# Patient Record
Sex: Female | Born: 1955 | Hispanic: Yes | Marital: Single | Smoking: Current some day smoker
Health system: Southern US, Community
[De-identification: ages and names within clinical notes are randomized; demographics above are authoritative.]

## PROBLEM LIST (undated history)

## (undated) DIAGNOSIS — I10 Essential (primary) hypertension: Secondary | ICD-10-CM

## (undated) DIAGNOSIS — I471 Supraventricular tachycardia, unspecified: Secondary | ICD-10-CM

## (undated) DIAGNOSIS — D1771 Benign lipomatous neoplasm of kidney: Secondary | ICD-10-CM

## (undated) DIAGNOSIS — M549 Dorsalgia, unspecified: Secondary | ICD-10-CM

## (undated) DIAGNOSIS — D649 Anemia, unspecified: Secondary | ICD-10-CM

## (undated) DIAGNOSIS — F329 Major depressive disorder, single episode, unspecified: Secondary | ICD-10-CM

## (undated) DIAGNOSIS — K759 Inflammatory liver disease, unspecified: Secondary | ICD-10-CM

## (undated) DIAGNOSIS — R000 Tachycardia, unspecified: Secondary | ICD-10-CM

## (undated) DIAGNOSIS — D3001 Benign neoplasm of right kidney: Secondary | ICD-10-CM

## (undated) DIAGNOSIS — E119 Type 2 diabetes mellitus without complications: Secondary | ICD-10-CM

## (undated) DIAGNOSIS — K219 Gastro-esophageal reflux disease without esophagitis: Secondary | ICD-10-CM

## (undated) DIAGNOSIS — N200 Calculus of kidney: Secondary | ICD-10-CM

## (undated) DIAGNOSIS — G43909 Migraine, unspecified, not intractable, without status migrainosus: Secondary | ICD-10-CM

## (undated) DIAGNOSIS — G8929 Other chronic pain: Secondary | ICD-10-CM

## (undated) DIAGNOSIS — M1990 Unspecified osteoarthritis, unspecified site: Secondary | ICD-10-CM

## (undated) DIAGNOSIS — F32A Depression, unspecified: Secondary | ICD-10-CM

## (undated) DIAGNOSIS — IMO0002 Reserved for concepts with insufficient information to code with codable children: Secondary | ICD-10-CM

## (undated) HISTORY — PX: APPENDECTOMY: SHX54

## (undated) HISTORY — DX: Depression, unspecified: F32.A

## (undated) HISTORY — DX: Benign neoplasm of right kidney: D30.01

## (undated) HISTORY — DX: Unspecified osteoarthritis, unspecified site: M19.90

## (undated) HISTORY — DX: Reserved for concepts with insufficient information to code with codable children: IMO0002

## (undated) HISTORY — DX: Major depressive disorder, single episode, unspecified: F32.9

## (undated) HISTORY — PX: REDUCTION MAMMAPLASTY: SUR839

## (undated) HISTORY — DX: Tachycardia, unspecified: R00.0

## (undated) HISTORY — DX: Benign lipomatous neoplasm of kidney: D17.71

## (undated) HISTORY — PX: KIDNEY STONE SURGERY: SHX686

---

## 2006-02-28 HISTORY — PX: GASTRIC BYPASS: SHX52

## 2012-11-26 ENCOUNTER — Emergency Department (HOSPITAL_COMMUNITY)
Admission: EM | Admit: 2012-11-26 | Discharge: 2012-11-26 | Disposition: A | Discharge status: Home / Self Care | Payer: Medicaid - Out of State | Attending: Emergency Medicine | Admitting: Emergency Medicine

## 2012-11-26 ENCOUNTER — Emergency Department (HOSPITAL_COMMUNITY): Payer: Medicaid - Out of State

## 2012-11-26 ENCOUNTER — Encounter (HOSPITAL_COMMUNITY): Payer: Self-pay

## 2012-11-26 DIAGNOSIS — I10 Essential (primary) hypertension: Secondary | ICD-10-CM | POA: Insufficient documentation

## 2012-11-26 DIAGNOSIS — F17200 Nicotine dependence, unspecified, uncomplicated: Secondary | ICD-10-CM | POA: Insufficient documentation

## 2012-11-26 DIAGNOSIS — Y9289 Other specified places as the place of occurrence of the external cause: Secondary | ICD-10-CM | POA: Insufficient documentation

## 2012-11-26 DIAGNOSIS — Y9389 Activity, other specified: Secondary | ICD-10-CM | POA: Insufficient documentation

## 2012-11-26 DIAGNOSIS — Z9889 Other specified postprocedural states: Secondary | ICD-10-CM | POA: Insufficient documentation

## 2012-11-26 DIAGNOSIS — Z9884 Bariatric surgery status: Secondary | ICD-10-CM | POA: Insufficient documentation

## 2012-11-26 DIAGNOSIS — W1809XA Striking against other object with subsequent fall, initial encounter: Secondary | ICD-10-CM | POA: Insufficient documentation

## 2012-11-26 DIAGNOSIS — S298XXA Other specified injuries of thorax, initial encounter: Secondary | ICD-10-CM | POA: Insufficient documentation

## 2012-11-26 DIAGNOSIS — Z79899 Other long term (current) drug therapy: Secondary | ICD-10-CM | POA: Insufficient documentation

## 2012-11-26 DIAGNOSIS — R0789 Other chest pain: Secondary | ICD-10-CM

## 2012-11-26 DIAGNOSIS — Z87442 Personal history of urinary calculi: Secondary | ICD-10-CM | POA: Insufficient documentation

## 2012-11-26 HISTORY — DX: Calculus of kidney: N20.0

## 2012-11-26 HISTORY — DX: Essential (primary) hypertension: I10

## 2012-11-26 LAB — BASIC METABOLIC PANEL
BUN: 15 mg/dL (ref 6–23)
Chloride: 106 mEq/L (ref 96–112)
GFR calc Af Amer: >90 mL/min (ref >90)
GFR calc non Af Amer: >90 mL/min (ref >90)
Potassium: 4.1 mEq/L (ref 3.5–5.1)
Sodium: 138 mEq/L (ref 135–145)

## 2012-11-26 LAB — CBC
HCT: 44.1 % (ref 36.0–46.0)
MCHC: 33.8 g/dL (ref 30.0–36.0)
Platelets: 273 10*3/uL (ref 150–400)
RDW: 12.4 % (ref 11.5–15.5)
WBC: 8.6 10*3/uL (ref 4.0–10.5)

## 2012-11-26 LAB — POCT I-STAT TROPONIN I
Troponin i, poc: 0 ng/mL (ref 0.00–0.08)
Troponin i, poc: 0.01 ng/mL (ref 0.00–0.08)

## 2012-11-26 LAB — D-DIMER, QUANTITATIVE: D-Dimer, Quant: <0.27 ug/mL-FEU (ref 0.00–0.48)

## 2012-11-26 MED ORDER — TRAMADOL-ACETAMINOPHEN 37.5-325 MG PO TABS: 1.0000 | ORAL_TABLET | Freq: Four times a day (QID) | ORAL | Status: DC | PRN

## 2012-11-26 MED ORDER — NAPROXEN 500 MG PO TABS: 500.0000 mg | ORAL_TABLET | Freq: Two times a day (BID) | ORAL | Status: DC

## 2012-11-26 MED ORDER — KETOROLAC TROMETHAMINE 30 MG/ML IJ SOLN
30.0000 mg | Freq: Once | INTRAMUSCULAR | Status: AC
  Administered 2012-11-26: 30 mg via INTRAVENOUS
  Filled 2012-11-26: qty 1

## 2012-11-26 MED ORDER — ASPIRIN 81 MG PO CHEW
324.0000 mg | CHEWABLE_TABLET | Freq: Once | ORAL | Status: AC
  Administered 2012-11-26: 324 mg via ORAL
  Filled 2012-11-26: qty 4

## 2012-11-26 NOTE — ED Provider Notes (Signed)
ECG interpretation   Date: 11/26/2012  Rate: 67  Rhythm: normal sinus rhythm  QRS Axis: normal  Intervals: normal  ST/T Wave abnormalities: normal  Conduction Disutrbances: none  Narrative Interpretation:   Old EKG Reviewed: no prior ecg   Lyanne Co, MD 11/26/12 1511

## 2012-11-26 NOTE — ED Notes (Addendum)
Pt c/o L side chest pain since last night and back pain since a fall x 2 weeks ago.  Pain score 10/10.  Pt sts pain increases with deep breathing.  Pt does not speak [place].  Hx of chronic back pain and vertigo.

## 2012-11-26 NOTE — ED Notes (Signed)
Patient refused IV. Patiaent stated, "I do not have any veins and I do not want anyone poking on me for 30 minutes." EDPA notified. Ordered to give toradol IM

## 2012-11-26 NOTE — ED Provider Notes (Signed)
CSN: [id number]     Arrival date & time 11/26/12  1352 History   First MD Initiated Contact with Patient 11/26/12 1544     Chief Complaint  Patient presents with  . Chest Pain  . Fall  . Back Pain   (Consider location/radiation/quality/duration/timing/severity/associated sxs/prior Treatment) HPI Comments: Patient presents with a chief complaint of chest pain.  She reports that the pain has been present for the past 2 weeks ever since she had a fall and hit the left side of her chest on the bathtub.  Pain located left anterior chest and does not radiate.  She reports that the pain is constant.  Pain worse with palpation and also with taking a deep breath.  She has not taken anything for pain prior to arrival.  She denies prolonged travel or surgery in the past 4 weeks.  Denies use of estrogen containing medication.  Denies lower extremity edema or pain.  Denies prior history of DVT or PE.  She currently smokes 4 cigarettes daily.  Patient does have a history of HTN.  Denies history of DM or Hyperlipidemia.  Denies prior cardiac history.  She states that she had a stress test done approximately 3 years ago in [place], which she reports was normal.  She denies SOB, nausea, vomiting, numbness, tingling, hemoptysis, fever, chills, or syncope.    Patient is a 57 y.o. female presenting with chest pain. The history is provided by the patient. The history is limited by a language barrier. A language interpreter was used (phone interpretor used).  Chest Pain   Past Medical History  Diagnosis Date  . Hypertension   . Kidney stones    Past Surgical History  Procedure Laterality Date  . Gastric bypass    . Appendectomy    . Breast surgery     History reviewed. No pertinent family history. History  Substance Use Topics  . Smoking status: Current Every Day Smoker -- 0.25 packs/day    Types: Cigarettes  . Smokeless tobacco: Never Used  . Alcohol Use: Yes     Comment: occ   OB History   Grav  Para Term Preterm Abortions TAB SAB Ect Mult Living                 Review of Systems  Cardiovascular: Positive for chest pain.  All other systems reviewed and are negative.    Allergies  Review of patient's allergies indicates no known allergies.  Home Medications   Current Outpatient Rx  Name  Route  Sig  Dispense  Refill  . dextran 70-hypromellose (TEARS RENEWED) ophthalmic solution   Both Eyes   Place 1 drop into both eyes 3 (three) times daily as needed (dry eyes).         . enalapril (VASOTEC) 10 MG tablet   Oral   Take 10 mg by mouth daily.         . meclizine (ANTIVERT) 25 MG tablet   Oral   Take 25 mg by mouth 3 (three) times daily as needed.         . meloxicam (MOBIC) 15 MG tablet   Oral   Take 15 mg by mouth daily.         . metoprolol (LOPRESSOR) 50 MG tablet   Oral   Take 50 mg by mouth 2 (two) times daily.         . traMADol-acetaminophen (ULTRACET) 37.5-325 MG per tablet   Oral   Take 1 tablet by mouth  every 6 (six) hours as needed for pain.         Marland Kitchen triamcinolone (NASACORT) 55 MCG/ACT nasal inhaler   Nasal   Place 2 sprays into the nose 2 (two) times daily.          BP 117/73  Pulse 69  Temp(Src) 98.2 F (36.8 C) (Oral)  Resp 16  SpO2 98% Physical Exam  Nursing note and vitals reviewed. Constitutional: She appears well-developed and well-nourished. No distress.  HENT:  Head: Normocephalic and atraumatic.  Mouth/Throat: Oropharynx is clear and moist.  Neck: Normal range of motion. Neck supple.  Cardiovascular: Normal rate, regular rhythm, normal heart sounds and intact distal pulses.   Pulmonary/Chest: Effort normal and breath sounds normal. No respiratory distress. She has no wheezes. She has no rales. She exhibits tenderness.  Left anterior chest tender to palpation.  Abdominal: Soft. There is no tenderness.  Musculoskeletal: Normal range of motion.  No lower extremity edema bilaterally  Neurological: She is alert. No  cranial nerve deficit or sensory deficit.  Skin: Skin is warm and dry. No rash noted. She is not diaphoretic. No erythema.  Psychiatric: She has a normal mood and affect.    ED Course  Procedures (including critical care time) Labs Review Labs Reviewed  BASIC METABOLIC PANEL - Abnormal; Notable for the following:    Glucose, Bld 116 (*)    All other components within normal limits  CBC  POCT I-STAT TROPONIN I   Imaging Review Dg Chest 2 View  11/26/2012   CLINICAL DATA:  Chest pain and shortness of breath.  EXAM: CHEST  2 VIEW  COMPARISON:  None.  FINDINGS: The heart size and mediastinal contours are within normal limits. Both lungs are clear. The visualized skeletal structures are unremarkable.  IMPRESSION: No active cardiopulmonary disease.   Electronically Signed   By: Richarda Overlie M.D.   On: 11/26/2012 16:24    Date: 11/28/2012  Rate: 67  Rhythm: normal sinus rhythm  QRS Axis: normal  Intervals: normal  ST/T Wave abnormalities: normal  Conduction Disutrbances:none  Narrative Interpretation:   Old EKG Reviewed: none available  Patient discussed with Dr. Karma Ganja. MDM  No diagnosis found. Patient is to be discharged with recommendation to follow up with PCP in regards to today's hospital visit. Chest pain is not likely of cardiac or pulmonary etiology d/t presentation,  VSS, no tracheal deviation, no JVD or new murmur, Heart RRR, breath sounds equal bilaterally, EKG without acute abnormalities, negative initial and 3 hour troponin,  negative CXR, and negative d-dimer. Chest wall tender to palpation.  Pain present since a fall.   Therefore, suspect the pain is musculoskeletal.  Pain improved after given Toradol.  Feel that patient is stable for discharge.   Discharge instruction and return precautions given using the spanish phone interpretor.         Pascal Lux [place], PA-C 11/28/12 1136

## 2012-11-26 NOTE — Progress Notes (Signed)
[facility] provided pt with a list of primary care resources. Patient stated that she was pending Medicaid.  °

## 2012-11-30 NOTE — ED Provider Notes (Signed)
Medical screening examination/treatment/procedure(s) were performed by non-physician practitioner and as supervising physician I was immediately available for consultation/collaboration.  Sinda Leedom K Linker, MD 11/30/12 2334 

## 2013-01-29 ENCOUNTER — Encounter (HOSPITAL_COMMUNITY): Payer: Self-pay | Admitting: Emergency Medicine

## 2013-01-29 ENCOUNTER — Emergency Department (HOSPITAL_COMMUNITY)
Admission: EM | Admit: 2013-01-29 | Discharge: 2013-01-29 | Disposition: A | Discharge status: Home / Self Care | Payer: Self-pay | Attending: Emergency Medicine | Admitting: Emergency Medicine

## 2013-01-29 ENCOUNTER — Emergency Department (HOSPITAL_COMMUNITY): Payer: Self-pay

## 2013-01-29 DIAGNOSIS — Y939 Activity, unspecified: Secondary | ICD-10-CM | POA: Insufficient documentation

## 2013-01-29 DIAGNOSIS — M79671 Pain in right foot: Secondary | ICD-10-CM

## 2013-01-29 DIAGNOSIS — Z87442 Personal history of urinary calculi: Secondary | ICD-10-CM | POA: Insufficient documentation

## 2013-01-29 DIAGNOSIS — Z79899 Other long term (current) drug therapy: Secondary | ICD-10-CM | POA: Insufficient documentation

## 2013-01-29 DIAGNOSIS — W1809XA Striking against other object with subsequent fall, initial encounter: Secondary | ICD-10-CM | POA: Insufficient documentation

## 2013-01-29 DIAGNOSIS — S9000XA Contusion of unspecified ankle, initial encounter: Secondary | ICD-10-CM | POA: Insufficient documentation

## 2013-01-29 DIAGNOSIS — W19XXXA Unspecified fall, initial encounter: Secondary | ICD-10-CM

## 2013-01-29 DIAGNOSIS — Y92009 Unspecified place in unspecified non-institutional (private) residence as the place of occurrence of the external cause: Secondary | ICD-10-CM | POA: Insufficient documentation

## 2013-01-29 DIAGNOSIS — F17200 Nicotine dependence, unspecified, uncomplicated: Secondary | ICD-10-CM | POA: Insufficient documentation

## 2013-01-29 DIAGNOSIS — I10 Essential (primary) hypertension: Secondary | ICD-10-CM | POA: Insufficient documentation

## 2013-01-29 DIAGNOSIS — R42 Dizziness and giddiness: Secondary | ICD-10-CM | POA: Insufficient documentation

## 2013-01-29 DIAGNOSIS — IMO0002 Reserved for concepts with insufficient information to code with codable children: Secondary | ICD-10-CM | POA: Insufficient documentation

## 2013-01-29 MED ORDER — TRAMADOL-ACETAMINOPHEN 37.5-325 MG PO TABS: 1.0000 | ORAL_TABLET | Freq: Four times a day (QID) | ORAL | Status: DC | PRN

## 2013-01-29 MED ORDER — LORAZEPAM 1 MG PO TABS: 1.0000 mg | ORAL_TABLET | Freq: Three times a day (TID) | ORAL | Status: DC | PRN

## 2013-01-29 MED ORDER — MECLIZINE HCL 25 MG PO TABS: 25.0000 mg | ORAL_TABLET | Freq: Three times a day (TID) | ORAL | Status: AC

## 2013-01-29 MED ORDER — KETOROLAC TROMETHAMINE 30 MG/ML IJ SOLN
30.0000 mg | Freq: Once | INTRAMUSCULAR | Status: AC
  Administered 2013-01-29: 30 mg via INTRAMUSCULAR
  Filled 2013-01-29: qty 1

## 2013-01-29 NOTE — ED Notes (Addendum)
Pt c/o fall Sunday s/t vertigo, pain bilaterally in the leg and knee, as well as pain in the left wrist. 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 Pt complains of some pressure in the left ear as well. Pt reports that she was taking antivert pills for the disequilibrium, but is not currently. Pt c/o chronic pain, states right foot pain is new.

## 2013-01-29 NOTE — Progress Notes (Signed)
[facility] provided pt with a list of primary care resources and a [facility] [employer] application.  °

## 2013-01-29 NOTE — ED Provider Notes (Signed)
CSN: [id number]     Arrival date & time 01/29/13  1238 History   First MD Initiated Contact with Patient 01/29/13 1330     Chief Complaint  Patient presents with  . Dizziness  . Fall    HPI  Patient presents today as after a fall with pain in her right foot.  She states that she has a history of vertigo, is not currently taking any medication.  After an episode 2 days ago she fell against a piece of furniture.  Since that time there's been pain in the right foot.  Pain is worse with ambulation.  Minimal relief with OTC medication.  No other significant current complaints.  Past Medical History  Diagnosis Date  . Hypertension   . Kidney stones    Past Surgical History  Procedure Laterality Date  . Gastric bypass    . Appendectomy    . Breast surgery     No family history on file. History  Substance Use Topics  . Smoking status: Current Every Day Smoker -- 0.25 packs/day    Types: Cigarettes  . Smokeless tobacco: Never Used  . Alcohol Use: Yes     Comment: occ   OB History   Grav Para Term Preterm Abortions TAB SAB Ect Mult Living                 Review of Systems  Constitutional:       Per HPI, otherwise negative  HENT:       Per HPI, otherwise negative  Respiratory:       Per HPI, otherwise negative  Cardiovascular:       Per HPI, otherwise negative  Gastrointestinal: Negative for vomiting.  Endocrine:       Negative aside from HPI  Genitourinary:       Neg aside from HPI   Musculoskeletal:       Per HPI, otherwise negative  Skin: Negative.   Neurological: Positive for dizziness. Negative for syncope, weakness, light-headedness and headaches.    Allergies  Review of patient's allergies indicates no known allergies.  Home Medications   Current Outpatient Rx  Name  Route  Sig  Dispense  Refill  . amiodarone (PACERONE) 200 MG tablet   Oral   Take 200 mg by mouth daily.         . DULoxetine (CYMBALTA) 30 MG capsule   Oral   Take 30 mg by mouth  daily.         . enalapril (VASOTEC) 10 MG tablet   Oral   Take 10 mg by mouth daily.         . metoprolol (LOPRESSOR) 50 MG tablet   Oral   Take 50 mg by mouth 2 (two) times daily.         Marland Kitchen triamcinolone (NASACORT) 55 MCG/ACT AERO nasal inhaler   Nasal   Place 2 sprays into the nose daily.         Marland Kitchen LORazepam (ATIVAN) 1 MG tablet   Oral   Take 1 tablet (1 mg total) by mouth 3 (three) times daily as needed (dizziness / nausea).   15 tablet   0   . meclizine (ANTIVERT) 25 MG tablet   Oral   Take 1 tablet (25 mg total) by mouth 3 (three) times daily.   30 tablet   0   . traMADol-acetaminophen (ULTRACET) 37.5-325 MG per tablet   Oral   Take 1 tablet by mouth every 6 (six) hours as  needed.   30 tablet   0    BP 124/80  Pulse 63  Temp(Src) 98.6 F (37 C) (Oral)  Resp 20  SpO2 97% Physical Exam  Nursing note and vitals reviewed. Constitutional: She is oriented to person, place, and time. She appears well-developed and well-nourished. No distress.  HENT:  Head: Normocephalic and atraumatic.  Eyes: Conjunctivae and EOM are normal. Pupils are equal, round, and reactive to light.  Cardiovascular: Normal rate and regular rhythm.   Pulmonary/Chest: Effort normal and breath sounds normal. No stridor. No respiratory distress.  Abdominal: She exhibits no distension.  Musculoskeletal: She exhibits no edema.       Right knee: Normal.       Right ankle: She exhibits decreased range of motion, swelling, ecchymosis and abnormal pulse. She exhibits no deformity and no laceration. Tenderness. Lateral malleolus and medial malleolus tenderness found. No AITFL, no CF ligament and no head of 5th metatarsal tenderness found. Achilles tendon normal.       Left ankle: Normal.       Feet:  Patient describes pain in multiple joints, then she has no other notable findings in any of these.  Neurological: She is alert and oriented to person, place, and time. She displays no atrophy and  no tremor. No cranial nerve deficit or sensory deficit. She exhibits normal muscle tone. She displays no seizure activity.  Patient describes dizziness with lateral head rotation  Skin: Skin is warm and dry.  Psychiatric: She has a normal mood and affect.    ED Course  Procedures (including critical care time) Labs Review Labs Reviewed - No data to display Imaging Review Dg Ankle Complete Right  01/29/2013   CLINICAL DATA:  Right ankle pain after fall.  EXAM: RIGHT ANKLE - COMPLETE 3+ VIEW  COMPARISON:  None.  FINDINGS: There is no evidence of fracture, dislocation, or joint effusion. There is no evidence of arthropathy or other focal bone abnormality. Soft tissues are unremarkable.  IMPRESSION: Normal right ankle.   Electronically Signed   By: Roque Lias M.D.   On: 01/29/2013 15:22   Dg Foot Complete Right  01/29/2013   CLINICAL DATA:  Right foot pain.  EXAM: RIGHT FOOT COMPLETE - 3+ VIEW  COMPARISON:  None.  FINDINGS: There is no evidence of fracture or dislocation. Joint spaces appear intact. Spurring of posterior calcaneus is noted. Soft tissues are unremarkable.  IMPRESSION: No acute abnormality seen in the right foot.   Electronically Signed   By: Roque Lias M.D.   On: 01/29/2013 15:20    EKG Interpretation   None       MDM   1. Fall at home, initial encounter   2. Foot pain, right   3. Vertigo    Patient presents as after fall.  Patient's x-rays are reassuring.  We had a lengthy conversation on home care, reinitiation of medication for vertigo, primary care followup.  Patient was appropriate for discharge after this.    Gerhard Munch, MD 01/29/13 [phone]

## 2013-01-29 NOTE — ED Notes (Addendum)
Pt does not speak [place], son translates.Pt has hx of vertigo and fell yesterday hitting couch, she twisted right ankle. Pt right foot is also swollen and she cannot put pressure on it with out hurting, swelling has been going on since yesterday. Pt states she is dizzy at the moment.

## 2013-04-12 ENCOUNTER — Ambulatory Visit: Payer: Medicaid - Out of State | Attending: Internal Medicine

## 2013-04-30 ENCOUNTER — Ambulatory Visit: Payer: Medicaid - Out of State | Admitting: Internal Medicine

## 2013-05-20 ENCOUNTER — Emergency Department (HOSPITAL_COMMUNITY)
Admission: EM | Admit: 2013-05-20 | Discharge: 2013-05-20 | Disposition: A | Discharge status: Home / Self Care | Payer: No Typology Code available for payment source | Source: Home / Self Care | Attending: Emergency Medicine | Admitting: Emergency Medicine

## 2013-05-20 ENCOUNTER — Encounter (HOSPITAL_COMMUNITY): Payer: Self-pay | Admitting: Emergency Medicine

## 2013-05-20 DIAGNOSIS — M549 Dorsalgia, unspecified: Secondary | ICD-10-CM

## 2013-05-20 DIAGNOSIS — M542 Cervicalgia: Secondary | ICD-10-CM

## 2013-05-20 DIAGNOSIS — M797 Fibromyalgia: Secondary | ICD-10-CM

## 2013-05-20 DIAGNOSIS — M2550 Pain in unspecified joint: Secondary | ICD-10-CM

## 2013-05-20 MED ORDER — METOPROLOL TARTRATE 50 MG PO TABS: 50.0000 mg | ORAL_TABLET | Freq: Two times a day (BID) | ORAL | Status: DC

## 2013-05-20 MED ORDER — DULOXETINE HCL 30 MG PO CPEP: 30.0000 mg | ORAL_CAPSULE | Freq: Every day | ORAL | Status: DC

## 2013-05-20 MED ORDER — TRAMADOL-ACETAMINOPHEN 37.5-325 MG PO TABS: 1.0000 | ORAL_TABLET | Freq: Four times a day (QID) | ORAL | Status: DC | PRN

## 2013-05-20 MED ORDER — MECLIZINE HCL 25 MG PO TABS: 25.0000 mg | ORAL_TABLET | Freq: Three times a day (TID) | ORAL | Status: DC | PRN

## 2013-05-20 NOTE — ED Notes (Signed)
Pt  Reports  Symptoms   Of  Back  And  Neck pain  X  4  Days        denys  Any  specefic injury     Also  Reports  Pain in  Joints  And  Hands       -  Pt  Has  No PCP   IN    [place]       hX OF htn

## 2013-05-20 NOTE — ED Notes (Signed)
Interpretation by      Burman Nieves  From  [place] office

## 2013-05-20 NOTE — Discharge Instructions (Signed)
Ejercicios para la espalda (Back Exercises) Estos ejercicios ayudan a tratar y prevenir lesiones en la espalda. El objetivo es aumentar la fuerza de los msculos abdominales y dorsales y la flexibilidad de la espalda. Debe comenzar con estos ejercicios cuando ya no tenga dolor. Los ejercicios para la espalda incluyen:  Inclinacin de la pelvis - Recustese sobre la espalda con las rodillas flexionadas. Incline la pelvis hasta que la parte inferior de la espalda se apoye en el piso. Mantenga esta posicin durante 5 a 10 segundos y repita entre 5 y 28 veces.  Rodilla al pecho  Empuje primero una rodilla contra el pecho y [place] 20 a 30 segundos; repita con la otra rodilla y luego con ambas a la vez. Esto puede realizarlo con la otra pierna extendida o flexionada, del modo en que se sienta ms cmodo.  Abdominales o despegar el cccix del suelo empleando la musculatura abdominal  [place] 90 grados. Comience inclinando la pelvis y realice un ejercicio abdominal lento y parcial, elevando el tronco slo entre 67 y 17 grados del suelo. Emplee al [employer] 2 y 3 segundos para cada abdominal. No realice los abdominales con las rodillas extendidas. Si le resulta difcil realizar abdominales parciales, simplemente haga lo que se explic anteriormente, pero slo contraiga los msculos abdominales y Civil engineer, contracting tal como se le ha indicado.  Inclinacin de la cadera - Recustese sobre la espalda con las rodillas flexionadas a 90 grados. Empjese con los pies y los hombros mientras eleva la cadera un par de centmetros del suelo, [place] durante 10 segundos y repita entre 5 y 10 veces.  Arcos dorsales  Acustese sobre el estmago e impulse el tronco hacia atrs sobre los codos flexionados. Presione lentamente con las manos, formando un arco con la zona inferior de la espalda. Repita entre 3 y 5 veces. Al realizar las repeticiones, luego de un tiempo disminuirn la rigidez y las  [place].  Elevacin de los hombros  Acustese hacia abajo con los brazos a los lados del cuerpo. [place] y Photographer torso contra el suelo mientras eleva lentamente la cabeza y los hombros del suelo. No exagere con los ejercicios, especialmente en el comienzo. Los ejercicios pueden causar alguna molestia leve en la espalda durante algunos minutos; sin embargo, si el dolor es muy intenso, o dura ms de 15 minutos, no siga con la actividad fsica hasta que consulte al profesional que lo asiste. Los problemas en la espalda mejoran de [place] lenta con esta terapia.  Consulte al profesional para que lo ayude a planificar un programa de ejercicios adecuado para su espalda. Document Released: 02/14/2005 Document Revised: 05/09/2011 [facility] Patient Information 2014 [place], [place].   TREATMENT  Treatment initially involves the use of ice and medication to help reduce pain and inflammation. It is also important to perform strengthening and stretching exercises and modify activities that worsen symptoms so the injury does not get worse. These exercises may be performed at home or with a therapist. For patients who experience severe symptoms, a soft padded collar may be recommended to be worn around the neck.  Improving your posture may help reduce symptoms. Posture improvement includes pulling your chin and abdomen in while sitting or standing. If you are sitting, sit in a firm chair with your buttocks against the back of the chair. While sleeping, try replacing your pillow with a small towel rolled to 2 inches in diameter, or use a cervical pillow. Poor sleeping positions delay healing.   MEDICATION  If pain medication is necessary, nonsteroidal anti-inflammatory medications, such as aspirin and ibuprofen, or other minor pain relievers, such as acetaminophen, are often recommended.  Do not take pain medication for 7 days before surgery.  Prescription pain relievers may be given if deemed  necessary by your caregiver. Use only as directed and only as much as you need.  HEAT AND COLD:   Cold treatment (icing) relieves pain and reduces inflammation. Cold treatment should be applied for 10 to 15 minutes every 2 to 3 hours for inflammation and pain and immediately after any activity that aggravates your symptoms. Use ice packs or an ice massage.  Heat treatment may be used prior to performing the stretching and strengthening activities prescribed by your caregiver, physical therapist, or athletic trainer. Use a heat pack or a warm soak.  SEEK MEDICAL CARE IF:   Symptoms get worse or do not improve in 2 weeks despite treatment.  New, unexplained symptoms develop (drugs used in treatment may produce side effects).  EXERCISES RANGE OF MOTION (ROM) AND STRETCHING EXERCISES - Cervical Strain and Sprain These exercises may help you when beginning to rehabilitate your injury. In order to successfully resolve your symptoms, you must improve your posture. These exercises are designed to help reduce the forward-head and rounded-shoulder posture which contributes to this condition. Your symptoms may resolve with or without further involvement from your physician, physical therapist or athletic trainer. While completing these exercises, remember:   Restoring tissue flexibility helps normal motion to return to the joints. This allows healthier, less painful movement and activity.  An effective stretch should be held for at least 20 seconds, although you may need to begin with shorter hold times for comfort.  A stretch should never be painful. You should only feel a gentle lengthening or release in the stretched tissue.  STRETCH- Axial Extensors  Lie on your back on the floor. You may bend your knees for comfort. Place a rolled up hand towel or dish towel, about 2 inches in diameter, under the part of your head that makes contact with the floor.  Gently tuck your chin, as if trying to make a  "double chin," until you feel a gentle stretch at the base of your head.  Hold _____10_____ seconds. Repeat _____10_____ times. Complete this exercise _____2_____ times per day.   STRETECH - Axial Extension   Stand or sit on a firm surface. Assume a good posture: chest up, shoulders drawn back, abdominal muscles slightly tense, knees unlocked (if standing) and feet hip width apart.  Slowly retract your chin so your head slides back and your chin slightly lowers.Continue to look straight ahead.  You should feel a gentle stretch in the back of your head. Be certain not to feel an aggressive stretch since this can cause headaches later.  Hold for ____10______ seconds. Repeat _____10_____ times. Complete this exercise ____2______ times per day.  STRETCH  Cervical Side Bend   Stand or sit on a firm surface. Assume a good posture: chest up, shoulders drawn back, abdominal muscles slightly tense, knees unlocked (if standing) and feet hip width apart.  Without letting your nose or shoulders move, slowly tip your right / left ear to your shoulder until your feel a gentle stretch in the muscles on the opposite side of your neck.  Hold _____10_____ seconds. Repeat _____10_____ times. Complete this exercise _____2_____ times per day.  STRETCH  Cervical Rotators   Stand or sit on a firm surface. Assume a good posture: chest  up, shoulders drawn back, abdominal muscles slightly tense, knees unlocked (if standing) and feet hip width apart.  Keeping your eyes level with the ground, slowly turn your head until you feel a gentle stretch along the back and opposite side of your neck.  Hold _____10_____ seconds. Repeat ____10______ times. Complete this exercise ____2______ times per day.  RANGE OF MOTION - Neck Circles   Stand or sit on a firm surface. Assume a good posture: chest up, shoulders drawn back, abdominal muscles slightly tense, knees unlocked (if standing) and feet hip width  apart.  Gently roll your head down and around from the back of one shoulder to the back of the other. The motion should never be forced or painful.  Repeat the motion 10-20 times, or until you feel the neck muscles relax and loosen. Repeat ____10______ times. Complete the exercise _____2_____ times per day.  STRENGTHENING EXERCISES - Cervical Strain and Sprain These exercises may help you when beginning to rehabilitate your injury. They may resolve your symptoms with or without further involvement from your physician, physical therapist or athletic trainer. While completing these exercises, remember:   Muscles can gain both the endurance and the strength needed for everyday activities through controlled exercises.  Complete these exercises as instructed by your physician, physical therapist or athletic trainer. Progress the resistance and repetitions only as guided.  You may experience muscle soreness or fatigue, but the pain or discomfort you are trying to eliminate should never worsen during these exercises. If this pain does worsen, stop and make certain you are following the directions exactly. If the pain is still present after adjustments, discontinue the exercise until you can discuss the trouble with your clinician.  STRENGTH Cervical Flexors, Isometric  Face a wall, standing about 6 inches away. Place a small pillow, a ball about 6-8 inches in diameter, or a folded towel between your forehead and the wall.  Slightly tuck your chin and gently push your forehead into the soft object. Push only with mild to moderate intensity, building up tension gradually. Keep your jaw and forehead relaxed.  Hold 10 to 20 seconds. Keep your breathing relaxed.  Release the tension slowly. Relax your neck muscles completely before you start the next repetition. Repeat _____10_____ times. Complete this exercise _____2_____ times per day.  STRENGTH- Cervical Lateral Flexors, Isometric   Stand about 6  inches away from a wall. Place a small pillow, a ball about 6-8 inches in diameter, or a folded towel between the side of your head and the wall.  Slightly tuck your chin and gently tilt your head into the soft object. Push only with mild to moderate intensity, building up tension gradually. Keep your jaw and forehead relaxed.  Hold 10 to 20 seconds. Keep your breathing relaxed.  Release the tension slowly. Relax your neck muscles completely before you start the next repetition. Repeat _____10_____ times. Complete this exercise ____2______ times per day.  STRENGTH  Cervical Extensors, Isometric   Stand about 6 inches away from a wall. Place a small pillow, a ball about 6-8 inches in diameter, or a folded towel between the back of your head and the wall.  Slightly tuck your chin and gently tilt your head back into the soft object. Push only with mild to moderate intensity, building up tension gradually. Keep your jaw and forehead relaxed.  Hold 10 to 20 seconds. Keep your breathing relaxed.  Release the tension slowly. Relax your neck muscles completely before you start the next repetition.  Repeat _____10_____ times. Complete this exercise _____2_____ times per day.  POSTURE AND BODY MECHANICS CONSIDERATIONS - Cervical Strain and Sprain Keeping correct posture when sitting, standing or completing your activities will reduce the stress put on different body tissues, allowing injured tissues a chance to heal and limiting painful experiences. The following are general guidelines for improved posture. Your physician or physical therapist will provide you with any instructions specific to your needs. While reading these guidelines, remember:  The exercises prescribed by your provider will help you have the flexibility and strength to maintain correct postures.  The correct posture provides the optimal environment for your joints to work. All of your joints have less wear and tear when properly  supported by a spine with good posture. This means you will experience a healthier, less painful body.  Correct posture must be practiced with all of your activities, especially prolonged sitting and standing. Correct posture is as important when doing repetitive low-stress activities (typing) as it is when doing a single heavy-load activity (lifting). PROLONGED STANDING WHILE SLIGHTLY LEANING FORWARD When completing a task that requires you to lean forward while standing in one place for a long time, place either foot up on a stationary 2-4 inch high object to help maintain the best posture. When both feet are on the ground, the low back tends to lose its slight inward curve. If this curve flattens (or becomes too large), then the back and your other joints will experience too much stress, fatigue more quickly and can cause pain.  RESTING POSITIONS Consider which positions are most painful for you when choosing a resting position. If you have pain with flexion-based activities (sitting, bending, stooping, squatting), choose a position that allows you to rest in a less flexed posture. You would want to avoid curling into a fetal position on your side. If your pain worsens with extension-based activities (prolonged standing, working overhead), avoid resting in an extended position such as sleeping on your stomach. Most people will find more comfort when they rest with their spine in a more neutral position, neither too rounded nor too arched. Lying on a non-sagging bed on your side with a pillow between your knees, or on your back with a pillow under your knees will often provide some relief. Keep in mind, being in any one position for a prolonged period of time, no matter how correct your posture, can still lead to stiffness. WALKING Walk with an upright posture. Your ears, shoulders and hips should all line-up. OFFICE WORK When working at a desk, create an environment that supports good, upright posture.  Without extra support, muscles fatigue and lead to excessive strain on joints and other tissues. CHAIR:  A chair should be able to slide under your desk when your back makes contact with the back of the chair. This allows you to work closely.  The chair's height should allow your eyes to be level with the upper part of your monitor and your hands to be slightly lower than your elbows.  Body position:  Your feet should make contact with the floor. If this is not possible, use a foot rest.  Keep your ears over your shoulders. This will reduce stress on your neck and low back. Document Released: 02/14/2005 Document Revised: 05/09/2011 Document Reviewed: 05/29/2008 [facility] Patient Information 2013 [place].

## 2013-05-20 NOTE — ED Provider Notes (Signed)
Chief Complaint   Chief Complaint  Patient presents with  . Back Pain    History of Present Illness   Shannon Andrade is a 58 year old female who recently moved here from [place] and about 3 months ago. She speaks limited [place], so a facility interpreter was used to obtain the history. She complains of a many long year history of chronic back, neck, and joint pain. The patient had a gastric bypass. She has been diagnosed with "arthritis" and degenerative disc disease. She has difficulty walking. She walks with a walker. She also has a cane. She lives with her son who prepares her meals and leaves food table next to the couch. She spends most of her day on the couch. She needs refills on all her meds. She takes Ultracet one every 6 hours, meclizine 25 mg 3 times a day, metoprolol 50 mg twice a day, and Cymbalta 30 mg a day.  Review of Systems     Other than as noted above, the patient denies any of the following symptoms: Systemic:  No fever, chills, sweats, fatigue, myalgias, headache, or anorexia. Eye:  No redness, pain or drainage. ENT:  No earache, nasal congestion, rhinorrhea, sinus pressure, or sore throat. Lungs:  No cough, sputum production, wheezing, shortness of breath.  Cardiovascular:  No chest pain, palpitations, or syncope. GI:  No nausea, vomiting, abdominal pain or diarrhea. GU:  No dysuria, frequency, or hematuria. Skin:  No rash or pruritis.   [place]     Past medical history, family history, social history, meds, and allergies were reviewed.  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  Physical Examination    Vital signs:  BP 103/56  Pulse 72  Temp(Src) 99.2 F (37.3 C) (Oral)  Resp 20  SpO2 98% General:  Alert, in no distress. Eye:  PERRL, full EOMs.  Lids and conjunctivas were normal. ENT:  TMs and canals were normal, without erythema or inflammation.  Nasal mucosa was clear and uncongested,  without drainage.  Mucous membranes were moist.  Pharynx was clear, without exudate or drainage.  There were no oral ulcerations or lesions. Neck:  Supple, no adenopathy, tenderness or mass. Thyroid was normal. Lungs:  No respiratory distress.  Lungs were clear to auscultation, without wheezes, rales or rhonchi.  Breath sounds were clear and equal bilaterally. Heart:  Regular rhythm, without gallops, murmers or rubs. Abdomen:  Soft, flat, and non-tender to palpation.  No hepatosplenomagaly or mass. Extremities: She is tender in all of her extremities, although joints have full range of motion, there is no swelling or joint deformity. Skin:  Clear, warm, and dry, without rash or lesions.   Assessment   The primary encounter diagnosis was Back pain. Diagnoses of Neck pain, Arthralgia, and Fibromyalgia were also pertinent to this visit.  She needs followup with primary care physician.  Plan     1.  Meds:  The following meds were prescribed:   Discharge Medication List as of 05/20/2013  5:12 PM    START taking these medications   Details  !! DULoxetine (CYMBALTA) 30 MG capsule Take 1 capsule (30 mg total) by mouth daily., Starting 05/20/2013, Until Discontinued, Normal    meclizine (ANTIVERT) 25 MG tablet Take 1 tablet (25 mg total) by mouth 3 (three) times daily as needed for dizziness., Starting 05/20/2013, Until Discontinued, Normal    !! metoprolol (LOPRESSOR) 50 MG tablet Take 1 tablet (50 mg  total) by mouth 2 (two) times daily., Starting 05/20/2013, Until Discontinued, Normal    !! traMADol-acetaminophen (ULTRACET) 37.5-325 MG per tablet Take 1 tablet by mouth every 6 (six) hours as needed., Starting 05/20/2013, Until Discontinued, Normal     !! - Potential duplicate medications found. Please discuss with provider.      2.  Patient Education/Counseling:  The patient was given appropriate handouts, self care instructions, and instructed in symptomatic relief.   She was given exercises  for the neck and the back.  3.  Follow up:  The patient was told to follow up with [facility].        Harden Mo, MD 05/20/13 [phone]

## 2013-05-28 ENCOUNTER — Ambulatory Visit: Payer: No Typology Code available for payment source | Attending: Internal Medicine

## 2013-05-28 ENCOUNTER — Other Ambulatory Visit: Payer: Self-pay | Admitting: Internal Medicine

## 2013-05-28 ENCOUNTER — Ambulatory Visit: Payer: No Typology Code available for payment source | Attending: Internal Medicine | Admitting: Internal Medicine

## 2013-05-28 VITALS — BP 88/60 | HR 60 | Temp 98.8°F | Resp 16 | Ht 68.0 in | Wt 236.0 lb

## 2013-05-28 DIAGNOSIS — N2889 Other specified disorders of kidney and ureter: Secondary | ICD-10-CM

## 2013-05-28 DIAGNOSIS — G894 Chronic pain syndrome: Secondary | ICD-10-CM

## 2013-05-28 DIAGNOSIS — I10 Essential (primary) hypertension: Secondary | ICD-10-CM | POA: Insufficient documentation

## 2013-05-28 DIAGNOSIS — M25519 Pain in unspecified shoulder: Secondary | ICD-10-CM | POA: Insufficient documentation

## 2013-05-28 DIAGNOSIS — M25569 Pain in unspecified knee: Secondary | ICD-10-CM | POA: Insufficient documentation

## 2013-05-28 DIAGNOSIS — M549 Dorsalgia, unspecified: Secondary | ICD-10-CM | POA: Insufficient documentation

## 2013-05-28 DIAGNOSIS — N289 Disorder of kidney and ureter, unspecified: Secondary | ICD-10-CM

## 2013-05-28 DIAGNOSIS — Z0000 Encounter for general adult medical examination without abnormal findings: Secondary | ICD-10-CM

## 2013-05-28 DIAGNOSIS — M2550 Pain in unspecified joint: Secondary | ICD-10-CM | POA: Insufficient documentation

## 2013-05-28 DIAGNOSIS — F17200 Nicotine dependence, unspecified, uncomplicated: Secondary | ICD-10-CM | POA: Insufficient documentation

## 2013-05-28 LAB — CBC WITH DIFFERENTIAL/PLATELET
Basophils Absolute: 0 10*3/uL (ref 0.0–0.1)
Basophils Relative: 0 % (ref 0–1)
Eosinophils Absolute: 0 10*3/uL (ref 0.0–0.7)
Eosinophils Relative: 0 % (ref 0–5)
HEMATOCRIT: 40.2 % (ref 36.0–46.0)
Hemoglobin: 13.8 g/dL (ref 12.0–15.0)
LYMPHS ABS: 3.2 10*3/uL (ref 0.7–4.0)
LYMPHS PCT: 40 % (ref 12–46)
MCH: 30.7 pg (ref 26.0–34.0)
MCHC: 34.3 g/dL (ref 30.0–36.0)
MCV: 89.5 fL (ref 78.0–100.0)
Monocytes Absolute: 0.5 10*3/uL (ref 0.1–1.0)
Monocytes Relative: 6 % (ref 3–12)
Neutro Abs: 4.3 10*3/uL (ref 1.7–7.7)
Neutrophils Relative %: 54 % (ref 43–77)
PLATELETS: 290 10*3/uL (ref 150–400)
RBC: 4.49 MIL/uL (ref 3.87–5.11)
RDW: 13.3 % (ref 11.5–15.5)
WBC: 7.9 10*3/uL (ref 4.0–10.5)

## 2013-05-28 LAB — VITAMIN D 25 HYDROXY (VIT D DEFICIENCY, FRACTURES): VIT D 25 HYDROXY: 12 ng/mL — AB (ref 30–89)

## 2013-05-28 LAB — COMPLETE METABOLIC PANEL WITH GFR
ALBUMIN: 4 g/dL (ref 3.5–5.2)
ALT: 11 U/L (ref 0–35)
AST: 17 U/L (ref 0–37)
Alkaline Phosphatase: 77 U/L (ref 39–117)
BUN: 14 mg/dL (ref 6–23)
CALCIUM: 9 mg/dL (ref 8.4–10.5)
CHLORIDE: 110 meq/L (ref 96–112)
CO2: 25 meq/L (ref 19–32)
CREATININE: 0.65 mg/dL (ref 0.50–1.10)
GFR, Est Non African American: >89 mL/min
Glucose, Bld: 83 mg/dL (ref 70–99)
Potassium: 4.8 mEq/L (ref 3.5–5.3)
Sodium: 142 mEq/L (ref 135–145)
Total Bilirubin: 0.4 mg/dL (ref 0.2–1.2)
Total Protein: 6.5 g/dL (ref 6.0–8.3)

## 2013-05-28 LAB — LIPID PANEL
Cholesterol: 150 mg/dL (ref 0–200)
HDL: 58 mg/dL (ref >39)
LDL Cholesterol: 73 mg/dL (ref 0–99)
TRIGLYCERIDES: 97 mg/dL (ref <150)
Total CHOL/HDL Ratio: 2.6 Ratio
VLDL: 19 mg/dL (ref 0–40)

## 2013-05-28 LAB — TSH: TSH: 0.853 u[IU]/mL (ref 0.350–4.500)

## 2013-05-28 LAB — POCT GLYCOSYLATED HEMOGLOBIN (HGB A1C): Hemoglobin A1C: 5.3

## 2013-05-28 LAB — URIC ACID: URIC ACID, SERUM: 4.3 mg/dL (ref 2.4–7.0)

## 2013-05-28 MED ORDER — METOPROLOL TARTRATE 50 MG PO TABS: 50.0000 mg | ORAL_TABLET | Freq: Two times a day (BID) | ORAL | Status: DC

## 2013-05-28 MED ORDER — ACETAMINOPHEN-CODEINE #2 300-15 MG PO TABS: 1.0000 | ORAL_TABLET | Freq: Three times a day (TID) | ORAL | Status: DC | PRN

## 2013-05-28 MED ORDER — AMIODARONE HCL 200 MG PO TABS: 200.0000 mg | ORAL_TABLET | Freq: Every day | ORAL | Status: DC

## 2013-05-28 MED ORDER — TRAMADOL HCL 50 MG PO TABS: 50.0000 mg | ORAL_TABLET | Freq: Three times a day (TID) | ORAL | Status: DC | PRN

## 2013-05-28 MED ORDER — DULOXETINE HCL 30 MG PO CPEP: 30.0000 mg | ORAL_CAPSULE | Freq: Every day | ORAL | Status: DC

## 2013-05-28 NOTE — Progress Notes (Signed)
Pt is here to establish care. Pt reports having widespread severe pain. Pt has an interpretor.

## 2013-05-28 NOTE — Progress Notes (Signed)
Patient ID: Shannon Andrade, female   DOB: April 25, 1955, 58 y.o.   MRN: [id number]   CC:  HPI: 58 year old female here to establish care. The patient recently moved from the ER and was in the urgent care on 3/23 for pain everywhere primarily back pain left shoulder pain left knee pain. The patient has a history of gastric bypass surgery and currently taking ranitidine and Prilosec. Patient also complains of having multiple imaging studies in the [place]. In having multiple collapsed lumbar spinal vertebrae. She has numbness and tingling in both her legs. She is morbidly obese and overweight and has difficulty with ambulation. Denies any similar urinary incontinence. Also complains of neck pain left shoulder pain and bilateral knee pain. She received Cymbalta and Ultracet from the ED. Patient has seen multiple providers in [place], but does not have any records from there. 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 She never had a colonoscopy Her most recent mammogram and Pap smear was last year She has a history of hypertension but her blood pressure is 88 systolic today. She denies any complaints of dizziness or headache   No Known Allergies Past Medical History  Diagnosis Date  . Hypertension   . Kidney stones    Current Outpatient Prescriptions on File Prior to Visit  Medication Sig Dispense Refill  . meclizine (ANTIVERT) 25 MG tablet Take 1 tablet (25 mg total) by mouth 3 (three) times daily as needed for dizziness.  90 tablet  2  . DULoxetine (CYMBALTA) 30 MG capsule Take 1 capsule (30 mg total) by mouth daily.  30 capsule  2  . LORazepam (ATIVAN) 1 MG tablet Take 1 tablet (1 mg total) by mouth 3 (three) times daily as needed (dizziness / nausea).  15 tablet  0  . triamcinolone (NASACORT) 55 MCG/ACT AERO nasal inhaler Place 2 sprays into the nose daily.        No current facility-administered medications on file prior to visit.   History reviewed. No pertinent family history. History   Social History  . Marital Status: Single    Spouse Name: N/A    Number of Children: N/A  . Years of Education: N/A   Occupational History  . Not on file.   Social History Main Topics  . Smoking status: Current Every Day Smoker -- 0.25 packs/day    Types: Cigarettes  . Smokeless tobacco: Never Used  . Alcohol Use: Yes     Comment: occ  . Drug Use: No  . Sexual Activity: No   Other Topics Concern  . Not on file   Social History Narrative  . No narrative on file    Review of Systems  Constitutional: Negative for fever, chills, diaphoresis, activity change, appetite change and fatigue.  HENT: Negative for ear pain, nosebleeds, congestion, facial swelling, rhinorrhea, neck pain, neck stiffness and ear discharge.   Eyes: Negative for pain, discharge, redness, itching and visual disturbance.  Respiratory: Negative for cough, choking, chest tightness, shortness of breath, wheezing and stridor.   Cardiovascular: Negative for chest pain, palpitations and leg swelling.  Gastrointestinal: Negative for abdominal distention.  Genitourinary: Negative for dysuria, urgency, frequency, hematuria, flank pain, decreased urine volume, difficulty urinating and dyspareunia.  Musculoskeletal: Negative for back pain, joint swelling, arthralgias and gait problem.  Neurological: Negative for dizziness, tremors, seizures, syncope, facial asymmetry, speech difficulty, weakness,  light-headedness, numbness and headaches.  Hematological: Negative for adenopathy. Does not bruise/bleed easily.  Psychiatric/Behavioral: Negative for hallucinations, behavioral problems, confusion, dysphoric mood, decreased concentration and agitation.    Objective:   Filed Vitals:   05/28/13 1507  BP: 88/60  Pulse: 60  Temp: 98.8 F (37.1 C)  Resp: 16    Physical Exam   Constitutional: Appears well-developed and well-nourished. No distress.  HENT: Normocephalic. External right and left ear normal. Oropharynx is clear and moist.  Eyes: Conjunctivae and EOM are normal. PERRLA, no scleral icterus.  Neck: Normal ROM. Neck supple. No JVD. No tracheal deviation. No thyromegaly.  CVS: RRR, S1/S2 +, no murmurs, no gallops, no carotid bruit.  Pulmonary: Effort and breath sounds normal, no stridor, rhonchi, wheezes, rales.  Abdominal: Soft. BS +,  no distension, tenderness, rebound or guarding.  Musculoskeletal: Normal range of motion. No edema and no tenderness.  Lymphadenopathy: No lymphadenopathy noted, cervical, inguinal. Neuro: Alert. Normal reflexes, muscle tone coordination. No cranial nerve deficit. Skin: Skin is warm and dry. No rash noted. Not diaphoretic. No erythema. No pallor.  Psychiatric: Normal mood and affect. Behavior, judgment, thought content normal.   Lab Results  Component Value Date   WBC 8.6 11/26/2012   HGB 14.9 11/26/2012   HCT 44.1 11/26/2012   MCV 95.0 11/26/2012   PLT 273 11/26/2012   Lab Results  Component Value Date   CREATININE 0.65 11/26/2012   BUN 15 11/26/2012   NA 138 11/26/2012   K 4.1 11/26/2012   CL 106 11/26/2012   CO2 21 11/26/2012    No results found for this basename: HGBA1C   Lipid Panel  No results found for this basename: chol, trig, hdl, cholhdl, vldl, ldlcalc       Assessment and plan:   There are no active problems to display for this patient.       hypertension Continue amiodarone and metoprolol Discontinued enalapril  Back pain Multiple joint pain Refer to sports medicine for left shoulder pain and left knee pain for possible epidural steroid injections No x-rays are being obtained as the patient already has had multiple imaging studies in [place] to tramadol and Tylenol with codeine   Establish care GI referral for colonoscopy Gynecology referral for Pap smear Routine  mammogram Baseline labs Schedule for renal ultrasound every 6 months  Need to address her vaccination status on her next visit  Patient to be seen in 6 weeks and she will bring in her medical records from [place]   The patient was given clear instructions to go to ER or return to medical center if symptoms don't improve, worsen or new problems develop. The patient verbalized understanding. The patient was told to call to get any lab results if not heard anything in the next week.

## 2013-05-29 ENCOUNTER — Telehealth: Payer: Self-pay | Admitting: Emergency Medicine

## 2013-05-29 MED ORDER — VITAMIN D (ERGOCALCIFEROL) 1.25 MG (50000 UNIT) PO CAPS: 50000.0000 [IU] | ORAL_CAPSULE | ORAL | Status: DC

## 2013-05-29 NOTE — Telephone Encounter (Signed)
Message copied by Ricci Barker on Wed May 29, 2013  5:31 PM ------      Message from: Allyson Sabal MD, [facility]      Created: Wed May 29, 2013 10:45 AM       Please notify patient and all labs are normal except vitamins D., [facility] ------

## 2013-05-29 NOTE — Addendum Note (Signed)
Addended by: Allyson Sabal MD, [facility] on: 05/29/2013 10:44 AM   Modules accepted: Orders

## 2013-05-29 NOTE — Telephone Encounter (Signed)
Pt lab results given to son per spanish interpretor. Pt medication at [facility]

## 2013-05-30 ENCOUNTER — Encounter: Payer: Self-pay | Admitting: Gastroenterology

## 2013-05-31 ENCOUNTER — Ambulatory Visit (HOSPITAL_COMMUNITY)
Admission: RE | Admit: 2013-05-31 | Discharge: 2013-05-31 | Disposition: A | Discharge status: Home / Self Care | Payer: No Typology Code available for payment source | Source: Ambulatory Visit | Attending: Internal Medicine | Admitting: Internal Medicine

## 2013-05-31 DIAGNOSIS — G894 Chronic pain syndrome: Secondary | ICD-10-CM

## 2013-05-31 DIAGNOSIS — N23 Unspecified renal colic: Secondary | ICD-10-CM | POA: Insufficient documentation

## 2013-05-31 DIAGNOSIS — Z0000 Encounter for general adult medical examination without abnormal findings: Secondary | ICD-10-CM

## 2013-05-31 DIAGNOSIS — N289 Disorder of kidney and ureter, unspecified: Secondary | ICD-10-CM | POA: Insufficient documentation

## 2013-05-31 NOTE — Addendum Note (Signed)
Addended by: Allyson Sabal MD, [facility] on: 05/31/2013 02:30 PM   Modules accepted: Orders

## 2013-06-03 ENCOUNTER — Telehealth: Payer: Self-pay | Admitting: Emergency Medicine

## 2013-06-03 NOTE — Telephone Encounter (Signed)
Both number listed not set up for voicemail. I will try again to give pt scheduled MRI appt @ [facility] 06/11/13 5pm

## 2013-06-05 ENCOUNTER — Other Ambulatory Visit: Payer: Self-pay | Admitting: Internal Medicine

## 2013-06-11 ENCOUNTER — Ambulatory Visit (HOSPITAL_COMMUNITY)
Admission: RE | Admit: 2013-06-11 | Discharge: 2013-06-11 | Disposition: A | Discharge status: Home / Self Care | Payer: No Typology Code available for payment source | Source: Ambulatory Visit | Attending: Internal Medicine | Admitting: Internal Medicine

## 2013-06-11 ENCOUNTER — Other Ambulatory Visit: Payer: Self-pay | Admitting: Internal Medicine

## 2013-06-11 DIAGNOSIS — Z0000 Encounter for general adult medical examination without abnormal findings: Secondary | ICD-10-CM

## 2013-06-11 DIAGNOSIS — N281 Cyst of kidney, acquired: Secondary | ICD-10-CM | POA: Insufficient documentation

## 2013-06-11 DIAGNOSIS — D3000 Benign neoplasm of unspecified kidney: Secondary | ICD-10-CM | POA: Insufficient documentation

## 2013-06-11 DIAGNOSIS — N289 Disorder of kidney and ureter, unspecified: Secondary | ICD-10-CM | POA: Insufficient documentation

## 2013-06-11 DIAGNOSIS — G894 Chronic pain syndrome: Secondary | ICD-10-CM

## 2013-06-11 DIAGNOSIS — N2889 Other specified disorders of kidney and ureter: Secondary | ICD-10-CM

## 2013-06-11 DIAGNOSIS — K7689 Other specified diseases of liver: Secondary | ICD-10-CM | POA: Insufficient documentation

## 2013-06-11 MED ORDER — GADOBENATE DIMEGLUMINE 529 MG/ML IV SOLN
20.0000 mL | Freq: Once | INTRAVENOUS | Status: AC | PRN
  Administered 2013-06-11: 19 mL via INTRAVENOUS

## 2013-06-11 NOTE — Progress Notes (Signed)
Good blood return from PIV start. [employer]

## 2013-06-12 ENCOUNTER — Telehealth: Payer: Self-pay | Admitting: Emergency Medicine

## 2013-06-12 NOTE — Telephone Encounter (Signed)
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 ------

## 2013-06-12 NOTE — Telephone Encounter (Signed)
Left message to call pt with MRI results

## 2013-06-19 ENCOUNTER — Telehealth: Payer: Self-pay | Admitting: Internal Medicine

## 2013-06-19 NOTE — Telephone Encounter (Signed)
error 

## 2013-06-28 ENCOUNTER — Encounter: Payer: Self-pay | Admitting: *Deleted

## 2013-07-05 ENCOUNTER — Ambulatory Visit (AMBULATORY_SURGERY_CENTER): Payer: No Typology Code available for payment source | Admitting: *Deleted

## 2013-07-05 VITALS — Ht 68.0 in | Wt 232.8 lb

## 2013-07-05 DIAGNOSIS — Z1211 Encounter for screening for malignant neoplasm of colon: Secondary | ICD-10-CM

## 2013-07-05 NOTE — Progress Notes (Signed)
Pt here today for PV for screening colonoscopy.  Interpreter is present.  Pt says that she has been having problems with reflux that is not relieved by taking daily Prilosec.  During conversation pt says that she has been having episodes of increased heart rate and chest pain intermittently for the last week that makes her feel like she is going to pass out; sometimes she "sweats" during episodes.  She says this has been getting worse for the last week.  During PV pt is not having pain.   Skin is warm, dry, and pink.  Pt will leave this appointment and go to ER to have this checked.   Colonoscopy scheduled for 5/21 with Dr Deatra Ina cancelled.  Pt was instructed to call back and schedule an OV to evaluate reflux symptoms and schedule colonoscopy after she is cleared from any cardiac issues.

## 2013-07-08 ENCOUNTER — Encounter: Payer: Self-pay | Admitting: Gastroenterology

## 2013-07-10 ENCOUNTER — Ambulatory Visit: Payer: No Typology Code available for payment source | Attending: Internal Medicine | Admitting: Internal Medicine

## 2013-07-10 ENCOUNTER — Telehealth: Payer: Self-pay | Admitting: Internal Medicine

## 2013-07-10 ENCOUNTER — Encounter: Payer: Self-pay | Admitting: Internal Medicine

## 2013-07-10 VITALS — BP 119/83 | HR 78 | Temp 98.8°F | Resp 16 | Wt 236.4 lb

## 2013-07-10 DIAGNOSIS — G894 Chronic pain syndrome: Secondary | ICD-10-CM | POA: Insufficient documentation

## 2013-07-10 DIAGNOSIS — I10 Essential (primary) hypertension: Secondary | ICD-10-CM | POA: Insufficient documentation

## 2013-07-10 DIAGNOSIS — R000 Tachycardia, unspecified: Secondary | ICD-10-CM | POA: Insufficient documentation

## 2013-07-10 DIAGNOSIS — F329 Major depressive disorder, single episode, unspecified: Secondary | ICD-10-CM | POA: Insufficient documentation

## 2013-07-10 DIAGNOSIS — R002 Palpitations: Secondary | ICD-10-CM | POA: Insufficient documentation

## 2013-07-10 DIAGNOSIS — E559 Vitamin D deficiency, unspecified: Secondary | ICD-10-CM | POA: Insufficient documentation

## 2013-07-10 DIAGNOSIS — M545 Low back pain, unspecified: Secondary | ICD-10-CM | POA: Insufficient documentation

## 2013-07-10 DIAGNOSIS — Z9884 Bariatric surgery status: Secondary | ICD-10-CM | POA: Insufficient documentation

## 2013-07-10 DIAGNOSIS — F3289 Other specified depressive episodes: Secondary | ICD-10-CM | POA: Insufficient documentation

## 2013-07-10 DIAGNOSIS — Z8679 Personal history of other diseases of the circulatory system: Secondary | ICD-10-CM | POA: Insufficient documentation

## 2013-07-10 DIAGNOSIS — F17200 Nicotine dependence, unspecified, uncomplicated: Secondary | ICD-10-CM | POA: Insufficient documentation

## 2013-07-10 DIAGNOSIS — Z79899 Other long term (current) drug therapy: Secondary | ICD-10-CM | POA: Insufficient documentation

## 2013-07-10 DIAGNOSIS — N289 Disorder of kidney and ureter, unspecified: Secondary | ICD-10-CM

## 2013-07-10 DIAGNOSIS — N2889 Other specified disorders of kidney and ureter: Secondary | ICD-10-CM

## 2013-07-10 DIAGNOSIS — M129 Arthropathy, unspecified: Secondary | ICD-10-CM | POA: Insufficient documentation

## 2013-07-10 MED ORDER — MECLIZINE HCL 25 MG PO TABS: 25.0000 mg | ORAL_TABLET | Freq: Three times a day (TID) | ORAL | Status: DC | PRN

## 2013-07-10 MED ORDER — TRAMADOL-ACETAMINOPHEN 37.5-325 MG PO TABS: 1.0000 | ORAL_TABLET | Freq: Three times a day (TID) | ORAL | Status: DC | PRN

## 2013-07-10 NOTE — Progress Notes (Signed)
Patient here with interpreter Follow up appointment for her leg pain and back pain

## 2013-07-10 NOTE — Progress Notes (Signed)
MRN: [id number] Name: Shannon Andrade  Sex: female Age: 58 y.o. DOB: 07/26/1955  Allergies: Review of patient's allergies indicates no known allergies.  Chief Complaint  Patient presents with  . Follow-up    HPI: Patient is 58 y.o. female who was seen by Dr. Allyson Sabal on the last visit, she has history of hypertension, cardiac arrhythmias, chronic upper and lower back pain, patient was referred to sports medicine, I reviewed the chart they recommended patient to be seen by pain management patient is requesting refill on the medication as per patient in the past she was prescribed Ultracet which helps her with the symptoms, she also was scheduled to get colonoscopy done patient has history of cardiac arrhythmias and recently has been having lot of palpitations, and that time she was advised to have a cardiology clearance before she goes for the procedure, as per patient she used to saw a cardiologist in the past prescribed her amiodarone which she currently is taking, her blood pressure is well controlled, she takes meclizine when necessary for dizziness. 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 Patient also brought her the previous imaging study reported some bulging in cervical spine, the report will be scanned in the medical record.  Past Medical History  Diagnosis Date  . Hypertension   . Kidney stones   . Depression   . Arthritis   . Tachycardia     Past Surgical History  Procedure Laterality Date  . Gastric bypass    . Appendectomy    . Breast surgery        Medication List       This list is accurate as of: 07/10/13  5:53 PM.  Always use your most recent med list.               acetaminophen-codeine 300-15 MG per tablet  Commonly known as:  TYLENOL #2  Take 1 tablet by mouth every 8 (eight) hours as needed for moderate pain.     amiodarone 200 MG tablet  Commonly known as:  PACERONE    Take 1 tablet (200 mg total) by mouth daily.     DULoxetine 30 MG capsule  Commonly known as:  CYMBALTA  Take 1 capsule (30 mg total) by mouth daily.     enalapril 10 MG tablet  Commonly known as:  VASOTEC  Take 10 mg by mouth daily.     LORazepam 1 MG tablet  Commonly known as:  ATIVAN  Take 1 tablet (1 mg total) by mouth 3 (three) times daily as needed (dizziness / nausea).     meclizine 25 MG tablet  Commonly known as:  ANTIVERT  Take 1 tablet (25 mg total) by mouth 3 (three) times daily as needed for dizziness.     metoprolol 50 MG tablet  Commonly known as:  LOPRESSOR  Take 1 tablet (50 mg total) by mouth 2 (two) times daily.     traMADol 50 MG tablet  Commonly known as:  ULTRAM  Take 1 tablet (50 mg total) by mouth every 8 (eight) hours as needed.     traMADol-acetaminophen 37.5-325 MG per tablet  Commonly known as:  ULTRACET  Take 1 tablet by mouth every 8 (eight) hours as needed for severe pain.     triamcinolone 55 MCG/ACT Aero nasal inhaler  Commonly known as:  NASACORT  Place 2 sprays into the nose daily.     Vitamin  D (Ergocalciferol) 50000 UNITS Caps capsule  Commonly known as:  DRISDOL  Take 1 capsule (50,000 Units total) by mouth every 7 (seven) days.        Meds ordered this encounter  Medications  . traMADol-acetaminophen (ULTRACET) 37.5-325 MG per tablet    Sig: Take 1 tablet by mouth every 8 (eight) hours as needed for severe pain.    Dispense:  60 tablet    Refill:  0  . meclizine (ANTIVERT) 25 MG tablet    Sig: Take 1 tablet (25 mg total) by mouth 3 (three) times daily as needed for dizziness.    Dispense:  90 tablet    Refill:  2     There is no immunization history on file for this patient.  No family history on file.  History  Substance Use Topics  . Smoking status: Current Some Day Smoker -- 0.25 packs/day    Types: Cigarettes  . Smokeless tobacco: Never Used  . Alcohol Use: 1.8 oz/week    3 Cans of beer per week    Review  of Systems   As noted in HPI  Filed Vitals:   07/10/13 1728  BP: 119/83  Pulse: 78  Temp: 98.8 F (37.1 C)  Resp: 16    Physical Exam  Physical Exam  Constitutional: No distress.  Eyes: EOM are normal. Pupils are equal, round, and reactive to light.  Cardiovascular: Normal rate and regular rhythm.   Pulmonary/Chest: Breath sounds normal. No respiratory distress. She has no wheezes. She has no rales.  Musculoskeletal: She exhibits no edema.  Upper mid and lower back some spinal and paraspinal tenderness    CBC    Component Value Date/Time   WBC 7.9 05/28/2013 1532   RBC 4.49 05/28/2013 1532   HGB 13.8 05/28/2013 1532   HCT 40.2 05/28/2013 1532   PLT 290 05/28/2013 1532   MCV 89.5 05/28/2013 1532   LYMPHSABS 3.2 05/28/2013 1532   MONOABS 0.5 05/28/2013 1532   EOSABS 0.0 05/28/2013 1532   BASOSABS 0.0 05/28/2013 1532    CMP     Component Value Date/Time   NA 142 05/28/2013 1532   K 4.8 05/28/2013 1532   CL 110 05/28/2013 1532   CO2 25 05/28/2013 1532   GLUCOSE 83 05/28/2013 1532   BUN 14 05/28/2013 1532   CREATININE 0.65 05/28/2013 1532   CREATININE 0.65 11/26/2012 1435   CALCIUM 9.0 05/28/2013 1532   PROT 6.5 05/28/2013 1532   ALBUMIN 4.0 05/28/2013 1532   AST 17 05/28/2013 1532   ALT 11 05/28/2013 1532   ALKPHOS 77 05/28/2013 1532   BILITOT 0.4 05/28/2013 1532   GFRNONAA >89 05/28/2013 1532   GFRNONAA >90 11/26/2012 1435   GFRAA >89 05/28/2013 1532   GFRAA >90 11/26/2012 1435    Lab Results  Component Value Date/Time   CHOL 150 05/28/2013  3:32 PM    No components found with this basename: hga1c    Lab Results  Component Value Date/Time   AST 17 05/28/2013  3:32 PM    Assessment and Plan  Chronic pain syndrome - Plan: Ambulatory referral to Pain Clinic, patient is given refill on traMADol-acetaminophen (ULTRACET) 37.5-325 MG per tablet  History of cardiac arrhythmia - Plan: Ambulatory referral to Cardiology  Essential hypertension, benign Blood pressure is well  controlled continue with her metoprolol. DASH diet.  Unspecified vitamin D deficiency On vitamin D supplement.   Palpitations - Plan: Ambulatory referral to Cardiology  Renal mass Patient has already been  referred to urologist.  Health Maintenance -Colonoscopy: Already been referred her to GI but needs cardiology clearance   Return in about 3 months (around 10/10/2013) for hypertension.  Lorayne Marek, MD

## 2013-07-10 NOTE — Telephone Encounter (Signed)
Pt would like results to be given to her son Cherly Beach, [phone] by phone because he speaks [place].

## 2013-07-12 ENCOUNTER — Telehealth: Payer: Self-pay

## 2013-07-12 NOTE — Telephone Encounter (Signed)
Spoke with patient's son He is aware of his moms lab results

## 2013-07-15 ENCOUNTER — Telehealth: Payer: Self-pay

## 2013-07-15 NOTE — Telephone Encounter (Signed)
Interpreter line used Patient is aware of her Appointment with Dr. Meda Coffee Scheduled for June 19 @3 :15 pm [phone]

## 2013-07-18 ENCOUNTER — Encounter: Payer: Self-pay | Admitting: Gastroenterology

## 2013-08-14 ENCOUNTER — Encounter: Payer: Self-pay | Admitting: *Deleted

## 2013-08-15 ENCOUNTER — Encounter: Payer: Self-pay | Admitting: Physical Medicine & Rehabilitation

## 2013-08-16 ENCOUNTER — Institutional Professional Consult (permissible substitution): Payer: No Typology Code available for payment source | Admitting: Cardiology

## 2013-08-16 ENCOUNTER — Other Ambulatory Visit (HOSPITAL_COMMUNITY): Payer: Self-pay | Admitting: *Deleted

## 2013-08-16 DIAGNOSIS — N6320 Unspecified lump in the left breast, unspecified quadrant: Secondary | ICD-10-CM

## 2013-08-20 ENCOUNTER — Inpatient Hospital Stay (HOSPITAL_COMMUNITY): Admission: RE | Admit: 2013-08-20 | Payer: No Typology Code available for payment source | Source: Ambulatory Visit

## 2013-08-27 ENCOUNTER — Other Ambulatory Visit: Payer: No Typology Code available for payment source

## 2013-09-03 ENCOUNTER — Encounter: Payer: Self-pay | Admitting: Gastroenterology

## 2013-09-10 ENCOUNTER — Inpatient Hospital Stay (HOSPITAL_COMMUNITY): Admission: RE | Admit: 2013-09-10 | Payer: No Typology Code available for payment source | Source: Ambulatory Visit

## 2013-09-10 ENCOUNTER — Telehealth: Payer: Self-pay | Admitting: Internal Medicine

## 2013-09-10 ENCOUNTER — Other Ambulatory Visit: Payer: Self-pay

## 2013-09-10 DIAGNOSIS — G894 Chronic pain syndrome: Secondary | ICD-10-CM

## 2013-09-10 MED ORDER — GABAPENTIN 300 MG PO CAPS: 300.0000 mg | ORAL_CAPSULE | Freq: Three times a day (TID) | ORAL | Status: DC

## 2013-09-10 MED ORDER — TRAMADOL-ACETAMINOPHEN 37.5-325 MG PO TABS: 1.0000 | ORAL_TABLET | Freq: Three times a day (TID) | ORAL | Status: DC | PRN

## 2013-09-10 NOTE — Telephone Encounter (Signed)
Pt calling for med refill request for traMADol-acetaminophen (ULTRACET) 37.5-325 MG per tablet and a refill on Gabapentin 300mg , which was prescribed by previous physician. Please f/u with pt.

## 2013-09-16 ENCOUNTER — Other Ambulatory Visit: Payer: No Typology Code available for payment source

## 2013-09-24 ENCOUNTER — Ambulatory Visit (HOSPITAL_COMMUNITY): Payer: No Typology Code available for payment source

## 2013-09-26 ENCOUNTER — Other Ambulatory Visit: Payer: No Typology Code available for payment source

## 2013-10-11 ENCOUNTER — Ambulatory Visit: Payer: No Typology Code available for payment source | Admitting: Internal Medicine

## 2013-10-21 ENCOUNTER — Other Ambulatory Visit: Payer: Self-pay | Admitting: Internal Medicine

## 2013-10-21 ENCOUNTER — Institutional Professional Consult (permissible substitution): Payer: No Typology Code available for payment source | Admitting: Cardiology

## 2013-10-31 ENCOUNTER — Ambulatory Visit: Payer: No Typology Code available for payment source | Attending: Internal Medicine | Admitting: Internal Medicine

## 2013-10-31 ENCOUNTER — Encounter: Payer: Self-pay | Admitting: Internal Medicine

## 2013-10-31 VITALS — BP 119/84 | HR 60 | Temp 98.2°F | Resp 17 | Wt 232.2 lb

## 2013-10-31 DIAGNOSIS — I10 Essential (primary) hypertension: Secondary | ICD-10-CM

## 2013-10-31 DIAGNOSIS — M545 Low back pain, unspecified: Secondary | ICD-10-CM | POA: Insufficient documentation

## 2013-10-31 DIAGNOSIS — K219 Gastro-esophageal reflux disease without esophagitis: Secondary | ICD-10-CM

## 2013-10-31 DIAGNOSIS — F17200 Nicotine dependence, unspecified, uncomplicated: Secondary | ICD-10-CM | POA: Insufficient documentation

## 2013-10-31 DIAGNOSIS — G894 Chronic pain syndrome: Secondary | ICD-10-CM

## 2013-10-31 DIAGNOSIS — Z760 Encounter for issue of repeat prescription: Secondary | ICD-10-CM | POA: Insufficient documentation

## 2013-10-31 DIAGNOSIS — G8929 Other chronic pain: Secondary | ICD-10-CM | POA: Insufficient documentation

## 2013-10-31 DIAGNOSIS — E559 Vitamin D deficiency, unspecified: Secondary | ICD-10-CM

## 2013-10-31 MED ORDER — PANTOPRAZOLE SODIUM 40 MG PO TBEC: 40.0000 mg | DELAYED_RELEASE_TABLET | Freq: Every day | ORAL | Status: DC

## 2013-10-31 MED ORDER — ACETAMINOPHEN-CODEINE #2 300-15 MG PO TABS: 1.0000 | ORAL_TABLET | Freq: Three times a day (TID) | ORAL | Status: DC | PRN

## 2013-10-31 MED ORDER — DULOXETINE HCL 40 MG PO CPEP: 30.0000 mg | ORAL_CAPSULE | Freq: Every day | ORAL | Status: DC

## 2013-10-31 MED ORDER — TRAMADOL-ACETAMINOPHEN 37.5-325 MG PO TABS: 1.0000 | ORAL_TABLET | Freq: Three times a day (TID) | ORAL | Status: DC | PRN

## 2013-10-31 NOTE — Progress Notes (Signed)
Patient complains of lower left side back pain that Extends all the way down her left leg Pain started about two months ago Patient also complains of mild swelling to her left ankle

## 2013-10-31 NOTE — Progress Notes (Signed)
MRN: [id number] Name: Shannon Andrade  Sex: female Age: 58 y.o. DOB: [birth year]  Allergies: Review of patient's allergies indicates no known allergies.  Chief Complaint  Patient presents with  . Follow-up    HPI: Patient is 58 y.o. female who has is she of hypertension chronic pain syndrome low back, patient had several imaging studies done in [place] , patient is requesting refill on her medication which also with the pain, she was already referred to pain management and had appointment with physical medicine and rehabilitation which apparently patient missed, she needs their evaluation to determine her disability, she is compliant with her medications of blood pressure is well controlled. Patient also complained of lot of reflux symptoms and is requesting medication for that.  Past Medical History  Diagnosis Date  . Hypertension   . Kidney stones   . Depression   . Arthritis   . Tachycardia   . DDD (degenerative disc disease)   . Angiomyolipoma of right kidney     Past Surgical History  Procedure Laterality Date  . Gastric bypass    . Appendectomy    . Breast surgery        Medication List       This list is accurate as of: 10/31/13 12:45 PM.  Always use your most recent med list.               acetaminophen-codeine 300-15 MG per tablet  Commonly known as:  TYLENOL #2  Take 1 tablet by mouth every 8 (eight) hours as needed for moderate pain.     amiodarone 200 MG tablet  Commonly known as:  PACERONE  Take 1 tablet (200 mg total) by mouth daily.     DULoxetine HCl 40 MG Cpep  Commonly known as:  CYMBALTA  Take 30 mg by mouth daily.     enalapril 10 MG tablet  Commonly known as:  VASOTEC  Take 10 mg by mouth daily.     gabapentin 300 MG capsule  Commonly known as:  NEURONTIN  Take 1 capsule (300 mg total) by mouth 3 (three) times daily.     LORazepam 1 MG tablet  Commonly known as:  ATIVAN  Take 1 tablet (1 mg total) by mouth 3 (three) times  daily as needed (dizziness / nausea).     metoprolol 50 MG tablet  Commonly known as:  LOPRESSOR  Take 1 tablet (50 mg total) by mouth 2 (two) times daily.     pantoprazole 40 MG tablet  Commonly known as:  PROTONIX  Take 1 tablet (40 mg total) by mouth daily.     traMADol 50 MG tablet  Commonly known as:  ULTRAM  Take 1 tablet (50 mg total) by mouth every 8 (eight) hours as needed.     traMADol-acetaminophen 37.5-325 MG per tablet  Commonly known as:  ULTRACET  Take 1 tablet by mouth every 8 (eight) hours as needed for severe pain.     TRAVEL SICKNESS 25 MG Chew  Generic drug:  Meclizine HCl  TAKE 1 TABLET BY MOUTH 3 TIMES DAILY AS NEEDED FOR DIZZINESS.     triamcinolone 55 MCG/ACT Aero nasal inhaler  Commonly known as:  NASACORT  Place 2 sprays into the nose daily.     Vitamin D (Ergocalciferol) 50000 UNITS Caps capsule  Commonly known as:  DRISDOL  Take 1 capsule (50,000 Units total) by mouth every 7 (seven) days.        Meds ordered this  encounter  Medications  . DULoxetine HCl (CYMBALTA) 40 MG CPEP    Sig: Take 30 mg by mouth daily.    Dispense:  120 capsule    Refill:  2  . pantoprazole (PROTONIX) 40 MG tablet    Sig: Take 1 tablet (40 mg total) by mouth daily.    Dispense:  30 tablet    Refill:  3  . acetaminophen-codeine (TYLENOL #2) 300-15 MG per tablet    Sig: Take 1 tablet by mouth every 8 (eight) hours as needed for moderate pain.    Dispense:  60 tablet    Refill:  0  . traMADol-acetaminophen (ULTRACET) 37.5-325 MG per tablet    Sig: Take 1 tablet by mouth every 8 (eight) hours as needed for severe pain.    Dispense:  60 tablet    Refill:  0     There is no immunization history on file for this patient.  History reviewed. No pertinent family history.  History  Substance Use Topics  . Smoking status: Current Some Day Smoker -- 0.25 packs/day    Types: Cigarettes  . Smokeless tobacco: Never Used  . Alcohol Use: 1.8 oz/week    3 Cans of beer  per week    Review of Systems   As noted in HPI  Filed Vitals:   10/31/13 1208  BP: 119/84  Pulse: 60  Temp: 98.2 F (36.8 C)  Resp: 17    Physical Exam  Physical Exam  Constitutional: No distress.  Eyes: EOM are normal. Pupils are equal, round, and reactive to light.  Cardiovascular: Normal rate and regular rhythm.   Pulmonary/Chest: Breath sounds normal. No respiratory distress. She has no wheezes. She has no rales.  Musculoskeletal:  Left lower lumbar paraspinal tenderness, with SLR test patient complains of back pain     CBC    Component Value Date/Time   WBC 7.9 05/28/2013 1532   RBC 4.49 05/28/2013 1532   HGB 13.8 05/28/2013 1532   HCT 40.2 05/28/2013 1532   PLT 290 05/28/2013 1532   MCV 89.5 05/28/2013 1532   LYMPHSABS 3.2 05/28/2013 1532   MONOABS 0.5 05/28/2013 1532   EOSABS 0.0 05/28/2013 1532   BASOSABS 0.0 05/28/2013 1532    CMP     Component Value Date/Time   NA 142 05/28/2013 1532   K 4.8 05/28/2013 1532   CL 110 05/28/2013 1532   CO2 25 05/28/2013 1532   GLUCOSE 83 05/28/2013 1532   BUN 14 05/28/2013 1532   CREATININE 0.65 05/28/2013 1532   CREATININE 0.65 11/26/2012 1435   CALCIUM 9.0 05/28/2013 1532   PROT 6.5 05/28/2013 1532   ALBUMIN 4.0 05/28/2013 1532   AST 17 05/28/2013 1532   ALT 11 05/28/2013 1532   ALKPHOS 77 05/28/2013 1532   BILITOT 0.4 05/28/2013 1532   GFRNONAA >89 05/28/2013 1532   GFRNONAA >90 11/26/2012 1435   GFRAA >89 05/28/2013 1532   GFRAA >90 11/26/2012 1435    Lab Results  Component Value Date/Time   CHOL 150 05/28/2013  3:32 PM    No components found with this basename: hga1c    Lab Results  Component Value Date/Time   AST 17 05/28/2013  3:32 PM    Assessment and Plan  Essential hypertension, benign Blood pressure is well controlled continue with current meds.  Unspecified vitamin D deficiency Patient is taking vitamin D supplement.  Chronic pain syndrome - Plan: I have increased the dose of Cymbalta to 40 mg, refill for  Ultracet  and Tylenol with codeine, patient to be evaluated by physical medicine and rehabilitation her advise that he schedule appointment.  DULoxetine HCl (CYMBALTA) 40 MG CPEP, acetaminophen-codeine (TYLENOL #2) 300-15 MG per tablet, traMADol-acetaminophen (ULTRACET) 37.5-325 MG per tablet, Ambulatory referral to Pain Clinic  Gastroesophageal reflux disease, esophagitis presence not specified - Plan advised patient for lifestyle modification, prescribed pantoprazole (PROTONIX) 40 MG tablet   Return in about 3 months (around 01/30/2014) for hypertension.  Lorayne Marek, MD

## 2013-11-04 ENCOUNTER — Encounter (HOSPITAL_COMMUNITY): Payer: Self-pay | Admitting: Emergency Medicine

## 2013-11-04 ENCOUNTER — Emergency Department (HOSPITAL_COMMUNITY): Payer: Medicaid Other

## 2013-11-04 ENCOUNTER — Inpatient Hospital Stay (HOSPITAL_COMMUNITY)
Admission: EM | Admit: 2013-11-04 | Discharge: 2013-11-05 | DRG: 313 | Disposition: A | Discharge status: Home / Self Care | Payer: Medicaid Other | Attending: Cardiovascular Disease | Admitting: Cardiovascular Disease

## 2013-11-04 DIAGNOSIS — F3289 Other specified depressive episodes: Secondary | ICD-10-CM | POA: Diagnosis present

## 2013-11-04 DIAGNOSIS — I471 Supraventricular tachycardia, unspecified: Secondary | ICD-10-CM | POA: Diagnosis present

## 2013-11-04 DIAGNOSIS — M129 Arthropathy, unspecified: Secondary | ICD-10-CM | POA: Diagnosis present

## 2013-11-04 DIAGNOSIS — F17200 Nicotine dependence, unspecified, uncomplicated: Secondary | ICD-10-CM | POA: Diagnosis present

## 2013-11-04 DIAGNOSIS — I10 Essential (primary) hypertension: Secondary | ICD-10-CM | POA: Diagnosis present

## 2013-11-04 DIAGNOSIS — F329 Major depressive disorder, single episode, unspecified: Secondary | ICD-10-CM | POA: Diagnosis present

## 2013-11-04 DIAGNOSIS — R079 Chest pain, unspecified: Secondary | ICD-10-CM | POA: Diagnosis present

## 2013-11-04 DIAGNOSIS — Z79899 Other long term (current) drug therapy: Secondary | ICD-10-CM | POA: Diagnosis not present

## 2013-11-04 DIAGNOSIS — Z9884 Bariatric surgery status: Secondary | ICD-10-CM | POA: Diagnosis not present

## 2013-11-04 DIAGNOSIS — R0789 Other chest pain: Principal | ICD-10-CM | POA: Diagnosis present

## 2013-11-04 HISTORY — DX: Type 2 diabetes mellitus without complications: E11.9

## 2013-11-04 HISTORY — DX: Gastro-esophageal reflux disease without esophagitis: K21.9

## 2013-11-04 HISTORY — DX: Other chronic pain: G89.29

## 2013-11-04 HISTORY — DX: Migraine, unspecified, not intractable, without status migrainosus: G43.909

## 2013-11-04 HISTORY — DX: Anemia, unspecified: D64.9

## 2013-11-04 HISTORY — DX: Inflammatory liver disease, unspecified: K75.9

## 2013-11-04 HISTORY — DX: Dorsalgia, unspecified: M54.9

## 2013-11-04 LAB — I-STAT TROPONIN, ED: TROPONIN I, POC: 0.01 ng/mL (ref 0.00–0.08)

## 2013-11-04 LAB — CBC
HCT: 43.6 % (ref 36.0–46.0)
HEMOGLOBIN: 14.3 g/dL (ref 12.0–15.0)
MCH: 32.1 pg (ref 26.0–34.0)
MCHC: 32.8 g/dL (ref 30.0–36.0)
MCV: 98 fL (ref 78.0–100.0)
PLATELETS: 228 10*3/uL (ref 150–400)
RBC: 4.45 MIL/uL (ref 3.87–5.11)
RDW: 12.6 % (ref 11.5–15.5)
WBC: 10 10*3/uL (ref 4.0–10.5)

## 2013-11-04 LAB — PRO B NATRIURETIC PEPTIDE: Pro B Natriuretic peptide (BNP): 607.8 pg/mL — ABNORMAL HIGH (ref 0–125)

## 2013-11-04 LAB — TROPONIN I

## 2013-11-04 MED ORDER — ASPIRIN 81 MG PO CHEW: 324.0000 mg | CHEWABLE_TABLET | Freq: Once | ORAL | Status: DC

## 2013-11-04 MED ORDER — AMIODARONE HCL 200 MG PO TABS
200.0000 mg | ORAL_TABLET | Freq: Every day | ORAL | Status: DC | PRN
  Filled 2013-11-04: qty 1

## 2013-11-04 MED ORDER — ASPIRIN EC 325 MG PO TBEC
325.0000 mg | DELAYED_RELEASE_TABLET | Freq: Once | ORAL | Status: AC
  Administered 2013-11-04: 325 mg via ORAL
  Filled 2013-11-04: qty 1

## 2013-11-04 MED ORDER — ONDANSETRON HCL 4 MG/2ML IJ SOLN
4.0000 mg | Freq: Once | INTRAMUSCULAR | Status: AC
  Administered 2013-11-04: 4 mg via INTRAVENOUS
  Filled 2013-11-04: qty 2

## 2013-11-04 MED ORDER — ALPRAZOLAM 0.25 MG PO TABS: 0.2500 mg | ORAL_TABLET | Freq: Two times a day (BID) | ORAL | Status: DC | PRN

## 2013-11-04 MED ORDER — ONDANSETRON HCL 4 MG/2ML IJ SOLN: 4.0000 mg | Freq: Four times a day (QID) | INTRAMUSCULAR | Status: DC | PRN

## 2013-11-04 MED ORDER — FERROUS SULFATE 325 (65 FE) MG PO TABS
325.0000 mg | ORAL_TABLET | Freq: Every day | ORAL | Status: DC
  Administered 2013-11-05: 325 mg via ORAL
  Filled 2013-11-04 (×2): qty 1

## 2013-11-04 MED ORDER — ASPIRIN 81 MG PO CHEW: 324.0000 mg | CHEWABLE_TABLET | ORAL | Status: AC

## 2013-11-04 MED ORDER — ASPIRIN 300 MG RE SUPP: 300.0000 mg | RECTAL | Status: AC

## 2013-11-04 MED ORDER — PANTOPRAZOLE SODIUM 40 MG PO TBEC: 40.0000 mg | DELAYED_RELEASE_TABLET | Freq: Every day | ORAL | Status: DC

## 2013-11-04 MED ORDER — ENALAPRIL MALEATE 10 MG PO TABS
10.0000 mg | ORAL_TABLET | Freq: Every day | ORAL | Status: DC
  Administered 2013-11-05: 10 mg via ORAL
  Filled 2013-11-04: qty 1

## 2013-11-04 MED ORDER — NITROGLYCERIN 0.4 MG SL SUBL: 0.4000 mg | SUBLINGUAL_TABLET | SUBLINGUAL | Status: DC | PRN

## 2013-11-04 MED ORDER — METOPROLOL TARTRATE 25 MG PO TABS: 25.0000 mg | ORAL_TABLET | Freq: Two times a day (BID) | ORAL | Status: DC

## 2013-11-04 MED ORDER — MECLIZINE HCL 25 MG PO TABS
25.0000 mg | ORAL_TABLET | Freq: Three times a day (TID) | ORAL | Status: DC | PRN
  Filled 2013-11-04: qty 1

## 2013-11-04 MED ORDER — HEPARIN BOLUS VIA INFUSION
4000.0000 [IU] | Freq: Once | INTRAVENOUS | Status: AC
  Administered 2013-11-04: 4000 [IU] via INTRAVENOUS
  Filled 2013-11-04: qty 4000

## 2013-11-04 MED ORDER — METOPROLOL TARTRATE 50 MG PO TABS
50.0000 mg | ORAL_TABLET | Freq: Two times a day (BID) | ORAL | Status: DC
  Administered 2013-11-04: 50 mg via ORAL
  Filled 2013-11-04 (×3): qty 1

## 2013-11-04 MED ORDER — DULOXETINE HCL 30 MG PO CPEP
30.0000 mg | ORAL_CAPSULE | Freq: Every day | ORAL | Status: DC
  Filled 2013-11-04: qty 1

## 2013-11-04 MED ORDER — ASPIRIN EC 81 MG PO TBEC
81.0000 mg | DELAYED_RELEASE_TABLET | Freq: Every day | ORAL | Status: DC
  Administered 2013-11-05: 81 mg via ORAL
  Filled 2013-11-04: qty 1

## 2013-11-04 MED ORDER — TRAMADOL-ACETAMINOPHEN 37.5-325 MG PO TABS: 1.0000 | ORAL_TABLET | Freq: Three times a day (TID) | ORAL | Status: DC | PRN

## 2013-11-04 MED ORDER — PNEUMOCOCCAL VAC POLYVALENT 25 MCG/0.5ML IJ INJ
0.5000 mL | INJECTION | INTRAMUSCULAR | Status: DC
  Filled 2013-11-04: qty 0.5

## 2013-11-04 MED ORDER — HEPARIN (PORCINE) IN NACL 100-0.45 UNIT/ML-% IJ SOLN
1100.0000 [IU]/h | INTRAMUSCULAR | Status: DC
  Administered 2013-11-04: 1100 [IU]/h via INTRAVENOUS
  Filled 2013-11-04 (×2): qty 250

## 2013-11-04 MED ORDER — ATORVASTATIN CALCIUM 40 MG PO TABS
40.0000 mg | ORAL_TABLET | Freq: Every day | ORAL | Status: DC
  Administered 2013-11-04: 40 mg via ORAL
  Filled 2013-11-04 (×2): qty 1

## 2013-11-04 MED ORDER — ACETAMINOPHEN 325 MG PO TABS: 650.0000 mg | ORAL_TABLET | ORAL | Status: DC | PRN

## 2013-11-04 MED ORDER — GABAPENTIN 300 MG PO CAPS
300.0000 mg | ORAL_CAPSULE | Freq: Three times a day (TID) | ORAL | Status: DC
  Administered 2013-11-04: 300 mg via ORAL
  Filled 2013-11-04 (×4): qty 1

## 2013-11-04 NOTE — ED Provider Notes (Signed)
CSN: [id number]     Arrival date & time    History   First MD Initiated Contact with Patient 11/04/13 1315     Chief Complaint  Patient presents with  . Chest Pain     (Consider location/radiation/quality/duration/timing/severity/associated sxs/prior Treatment) HPI  58 year old female with history of tachycardia, hypertension, depression who is brought here via EMS for evaluation of chest pain. Prior to arrival EMS documented the patient has a weak radial pulse with evidence of SVT. She was given for medical and Zofran and 6 mg of adenosine prior to arrival. Her heart rate normalized. History obtained through son who is at bedside. Patient is Spanish-speaking. 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 She usually takes amiodarone as needed for. She also complaining of having right leg and hip pain for the past several months. She recently moved from [place] down to [place] to reside here. She went to the primary care Dr. for the first time earlier this week. At that time she was complaining about her leg pain and depression. She was prescribed tramadol, and Cymbalta. Today around 12 PM she took her usual medication and to the Cymbalta for the first time. Within a few minutes afterward she developed heart palpitation, diaphoretic, chest pressure, with pain radiates to her left arm, lightheadedness, weakness, nauseous. She then took amiodarone and called EMS. Shortly after receiving adenosine and Zofran her symptoms improve. At this time she denies having any active chest pain but does endorse some nausea. No active shortness of breath. She denies any fever, chills, productive cough, hemoptysis, numbness, weakness. No prior history of heart attack.   Past Medical History  Diagnosis Date  . Hypertension   . Kidney stones   . Depression   . Arthritis   . Tachycardia   . DDD (degenerative disc disease)   . Angiomyolipoma of right kidney    Past  Surgical History  Procedure Laterality Date  . Gastric bypass    . Appendectomy    . Breast surgery     History reviewed. No pertinent family history. History  Substance Use Topics  . Smoking status: Current Some Day Smoker -- 0.25 packs/day    Types: Cigarettes  . Smokeless tobacco: Never Used  . Alcohol Use: 1.8 oz/week    3 Cans of beer per week   OB History   Grav Para Term Preterm Abortions TAB SAB Ect Mult Living                 Review of Systems  All other systems reviewed and are negative.     Allergies  Review of patient's allergies indicates no known allergies.  Home Medications   Prior to Admission medications   Medication Sig Start Date End Date Taking? Authorizing Provider  acetaminophen-codeine (TYLENOL #2) 300-15 MG per tablet Take 1 tablet by mouth every 8 (eight) hours as needed for moderate pain. 10/31/13   Lorayne Marek, MD  amiodarone (PACERONE) 200 MG tablet Take 1 tablet (200 mg total) by mouth daily. 05/28/13   Reyne Dumas, MD  enalapril (VASOTEC) 10 MG tablet Take 10 mg by mouth daily.    Historical Provider, MD  gabapentin (NEURONTIN) 300 MG capsule Take 1 capsule (300 mg total) by mouth 3 (three) times daily. 09/10/13   Lorayne Marek, MD  LORazepam (ATIVAN) 1 MG tablet Take 1 tablet (1 mg total) by mouth 3 (three) times daily as needed (dizziness / nausea). 01/29/13   Carmin Muskrat, MD  metoprolol (LOPRESSOR) 50 MG tablet Take 1 tablet (50 mg total) by mouth 2 (two) times daily. 05/28/13   Reyne Dumas, MD  pantoprazole (PROTONIX) 40 MG tablet Take 1 tablet (40 mg total) by mouth daily. 10/31/13   Lorayne Marek, MD  traMADol (ULTRAM) 50 MG tablet Take 1 tablet (50 mg total) by mouth every 8 (eight) hours as needed. 05/28/13   Reyne Dumas, MD  traMADol-acetaminophen (ULTRACET) 37.5-325 MG per tablet Take 1 tablet by mouth every 8 (eight) hours as needed for severe pain. 10/31/13   Lorayne Marek, MD  TRAVEL SICKNESS 25 MG CHEW TAKE 1 TABLET BY MOUTH 3 TIMES  DAILY AS NEEDED FOR DIZZINESS. 10/22/13   Lorayne Marek, MD  triamcinolone (NASACORT) 55 MCG/ACT AERO nasal inhaler Place 2 sprays into the nose daily.    Historical Provider, MD  Vitamin D, Ergocalciferol, (DRISDOL) 50000 UNITS CAPS capsule Take 1 capsule (50,000 Units total) by mouth every 7 (seven) days. 05/29/13   Reyne Dumas, MD   BP 114/80  Pulse 80  Temp(Src) 97.8 F (36.6 C) (Oral)  Resp 16  SpO2 100% Physical Exam  Nursing note and vitals reviewed. Constitutional: She is oriented to person, place, and time. She appears well-developed and well-nourished. No distress.  HENT:  Head: Atraumatic.  Eyes: Conjunctivae are normal.  Neck: Neck supple.  Cardiovascular: Normal rate and regular rhythm.   Pulmonary/Chest: Effort normal and breath sounds normal.  Abdominal: Soft.  Musculoskeletal: She exhibits no edema.  Bilateral lower extremities without palpable cords, erythema, edema, negative Homan sign.  Neurological: She is alert and oriented to person, place, and time.  Skin: No rash noted.  Psychiatric: She has a normal mood and affect.    ED Course  Procedures (including critical care time)  1:59 PM Patient with history of recurrent SVT here with episode of chest pressure and heart palpitation. The symptom is typical for ACS but this could be related to her recent tachycardia. No documented cardiac workup in our system, patient recently moved from [place] to [place]. Plan to check delta troponin, will consult cardiology for recommendation and outpatient followup.  Care discussed with Dr. Tawnya Crook.   4:32 PM EKG without evidence of acute ischemic changes, and first set of troponin is unremarkable. Normal H&H. Elevated pro BNP of 607 without previous value for comparison. Patient is currently chest pain-free. aspirin given.  I have consulted with cardiologist, DR. Doylene Canard who agrees to see her and plan to admit her for obs.    Labs Review Labs Reviewed  PRO B  NATRIURETIC PEPTIDE - Abnormal; Notable for the following:    Pro B Natriuretic peptide (BNP) 607.8 (*)    All other components within normal limits  CBC  I-STAT TROPOININ, ED  Randolm Idol, ED    Imaging Review Dg Chest Port 1 View  11/04/2013   CLINICAL DATA:  Chest pain.  EXAM: PORTABLE CHEST - 1 VIEW  COMPARISON:  11/26/2012.  FINDINGS: Trachea is midline. Heart size normal. Lungs are clear but somewhat low in volume. No pleural fluid.  IMPRESSION: No acute findings.   Electronically Signed   By: Lorin Picket M.D.   On: 11/04/2013 14:02  CXR reviewed by me.    EKG Interpretation None      Date: 11/04/2013  Rate: 79  Rhythm: normal sinus rhythm  QRS Axis: normal  Intervals: normal  ST/T Wave abnormalities: normal  Conduction Disutrbances: none  Narrative Interpretation:   Old EKG Reviewed: No significant changes noted  MDM   Final diagnoses:  Paroxysmal SVT (supraventricular tachycardia)    BP 120/81  Pulse 59  Temp(Src) 97.8 F (36.6 C) (Oral)  Resp 15  SpO2 98%  I have reviewed nursing notes and vital signs. I personally reviewed the imaging tests through PACS system  I reviewed available ER/hospitalization records thought the EMR     Domenic Moras, [place] 11/04/13 1645

## 2013-11-04 NOTE — Progress Notes (Addendum)
ANTICOAGULATION CONSULT NOTE - Initial Consult  Pharmacy Consult for Heparin Indication: chest pain/ACS  No Known Allergies  Patient Measurements:   Actual Body Weight: 103.5 kg Ideal Body Weight: 64 kg Heparin Dosing Weight: 87 kg  Vital Signs: Temp: 97.8 F (36.6 C) (09/07 1312) Temp src: Oral (09/07 1312) BP: 123/71 mmHg (09/07 1800) Pulse Rate: 65 (09/07 1800)  Labs:  Recent Labs  11/04/13 1319  HGB 14.3  HCT 43.6  PLT 228    The CrCl is unknown because both a height and weight (above a minimum accepted value) are required for this calculation.   Medical History: Past Medical History  Diagnosis Date  . Hypertension   . Kidney stones   . Depression   . Arthritis   . Tachycardia   . DDD (degenerative disc disease)   . Angiomyolipoma of right kidney     Medications:   (Not in a hospital admission) Scheduled:  . aspirin  324 mg Oral NOW   Or  . aspirin  300 mg Rectal NOW  . [START ON 11/05/2013] aspirin EC  81 mg Oral Daily  . atorvastatin  40 mg Oral q1800  . DULoxetine  30 mg Oral Daily  . enalapril  10 mg Oral Daily  . [START ON 11/05/2013] ferrous sulfate  325 mg Oral Q breakfast  . gabapentin  300 mg Oral TID  . metoprolol  50 mg Oral BID  . pantoprazole  40 mg Oral Daily   Infusions:    Assessment: 58 yo female to start heparin for chest pain/ACS rule out. CBC is normal.   Goal of Therapy:  Heparin level 0.3-0.7 units/ml Monitor platelets by anticoagulation protocol: Yes   Plan:  Give 4000 units bolus x 1 Start heparin infusion at 1100 units/hr Check anti-Xa level in 6 hours and daily while on heparin Continue to monitor H&H and platelets  Andrey Cota. Diona Foley, PharmD Clinical Pharmacist Pager [phone] 11/04/2013,6:31 PM

## 2013-11-04 NOTE — ED Notes (Signed)
Pt called EMS 30 minutes after taking cymbalta. Pt called EMS thinking that she was having an allergic reaction. Upon EMS arrival pt had a weak radial pulse with SVT.  Pt was given 4 mg of zofran and 6mg  adenisone.

## 2013-11-04 NOTE — ED Notes (Addendum)
Admitting MD at bedside. Verbal order from admitting MD given to wait and collect blood upstairs. Spanish interpreter used

## 2013-11-04 NOTE — ED Notes (Signed)
Phlebotomy called at this time.  

## 2013-11-04 NOTE — H&P (Signed)
Referring Physician:  [name] is an 58 y.o. female.                       Chief Complaint: Chest pain  HPI: 58 year old female with history of tachycardia, hypertension, depression who is brought here via EMS for evaluation of chest pain. Prior to arrival EMS documented the patient has a weak radial pulse with evidence of SVT. History obtained through son who is at bedside. Patient is Spanish-speaking. 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 She usually takes amiodarone as needed for. She also complaining of having right leg and hip pain for the past several months. She recently moved from [place] down to [place] to reside here. She went to the primary care Dr. for the first time earlier this week. At that time she was complaining about her leg pain and depression. She was prescribed tramadol, and Cymbalta. Today around 12 PM she took her usual medication and took the Cymbalta for the first time. Within a few minutes afterward she developed heart palpitation, diaphoretic, chest pressure, with pain radiates to her left arm, lightheadedness, weakness, nauseous. She then took amiodarone and called EMS. Shortly after receiving adenosine and Zofran her symptoms improve. Currently she is chest pain free.   Past Medical History  Diagnosis Date  . Hypertension   . Kidney stones   . Depression   . Arthritis   . Tachycardia   . DDD (degenerative disc disease)   . Angiomyolipoma of right kidney       Past Surgical History  Procedure Laterality Date  . Gastric bypass    . Appendectomy    . Breast surgery      History reviewed. No pertinent family history. Social History:  reports that she has been smoking Cigarettes.  She has been smoking about 0.25 packs per day. She has never used smokeless tobacco. She reports that she drinks about 1.8 ounces of alcohol per week. She reports that she does not use illicit drugs.  Allergies: No  Known Allergies   (Not in a hospital admission)  Results for orders placed during the hospital encounter of 11/04/13 (from the past 48 hour(s))  PRO B NATRIURETIC PEPTIDE     Status: Abnormal   Collection Time    11/04/13  1:19 PM      Result Value Ref Range   Pro B Natriuretic peptide (BNP) 607.8 (*) 0 - 125 pg/mL  CBC     Status: None   Collection Time    11/04/13  1:19 PM      Result Value Ref Range   WBC 10.0  4.0 - 10.5 K/uL   RBC 4.45  3.87 - 5.11 MIL/uL   Hemoglobin 14.3  12.0 - 15.0 g/dL   HCT 43.6  36.0 - 46.0 %   MCV 98.0  78.0 - 100.0 fL   MCH 32.1  26.0 - 34.0 pg   MCHC 32.8  30.0 - 36.0 g/dL   RDW 12.6  11.5 - 15.5 %   Platelets 228  150 - 400 K/uL  I-STAT TROPOININ, ED     Status: None   Collection Time    11/04/13  3:10 PM      Result Value Ref Range   Troponin i, poc 0.01  0.00 - 0.08 ng/mL   Comment 3            Comment: Due to the release kinetics of cTnI,  a negative result within the first hours     of the onset of symptoms does not rule out     myocardial infarction with certainty.     If myocardial infarction is still suspected,     repeat the test at appropriate intervals.   Dg Chest Port 1 View  11/04/2013   CLINICAL DATA:  Chest pain.  EXAM: PORTABLE CHEST - 1 VIEW  COMPARISON:  11/26/2012.  FINDINGS: Trachea is midline. Heart size normal. Lungs are clear but somewhat low in volume. No pleural fluid.  IMPRESSION: No acute findings.   Electronically Signed   By: Lorin Picket M.D.   On: 11/04/2013 14:02    Review Of Systems Constitutional: Negative for fever, chills, diaphoresis, activity change, appetite change and fatigue.  HENT: Negative for ear pain, nosebleeds, congestion, facial swelling, rhinorrhea, neck pain, neck stiffness and ear discharge.  Eyes: Negative for pain, discharge, redness, itching and visual disturbance.  Respiratory: Negative for cough, choking, chest tightness, shortness of breath, wheezing and stridor.   Cardiovascular: Negative for chest pain, palpitations and leg swelling.  Gastrointestinal: Negative for abdominal distention.  Genitourinary: Negative for dysuria, urgency, frequency, hematuria, flank pain, decreased urine volume, difficulty urinating and dyspareunia.  Musculoskeletal: Negative for back pain, joint swelling, arthralgias and gait problem.  Neurological: Negative for dizziness, tremors, seizures, syncope, facial asymmetry, speech difficulty, weakness, light-headedness, numbness and headaches.  Hematological: Negative for adenopathy. Does not bruise/bleed easily.  Psychiatric/Behavioral: Negative for hallucinations, behavioral problems, confusion, dysphoric mood, decreased concentration and agitation.    Blood pressure 130/79, pulse 57, temperature 97.8 F (36.6 C), temperature source Oral, resp. rate 14, SpO2 97.00%.  Physical Exam  Nursing note and vitals reviewed.  Constitutional: She appears well-developed and well-nourished. No distress.  HENT: Head: Atraumatic. Eyes: Conjunctivae are normal.  Neck: Neck supple.  Cardiovascular: Normal rate and regular rhythm.  Pulmonary/Chest: Effort normal and breath sounds normal.  Abdominal: Soft.  Musculoskeletal: She exhibits no edema. Bilateral lower extremities without palpable cords, erythema, edema, negative Homan sign.  Neurological: She is alert and oriented to person, place, and time.  Skin: No rash noted.  Psychiatric: She has a normal mood and affect.   Assessment/Plan Chest pain PSVT Depression  Place in observation/R/O MI/ Nuclear stress test in AM.  Birdie Riddle, MD  11/04/2013, 5:45 PM

## 2013-11-05 ENCOUNTER — Inpatient Hospital Stay (HOSPITAL_COMMUNITY): Payer: Medicaid Other

## 2013-11-05 LAB — BASIC METABOLIC PANEL
ANION GAP: 9 (ref 5–15)
BUN: 10 mg/dL (ref 6–23)
CO2: 25 meq/L (ref 19–32)
Calcium: 8.6 mg/dL (ref 8.4–10.5)
Chloride: 107 mEq/L (ref 96–112)
Creatinine, Ser: 0.64 mg/dL (ref 0.50–1.10)
GFR calc Af Amer: >90 mL/min (ref >90)
GFR calc non Af Amer: >90 mL/min (ref >90)
Glucose, Bld: 89 mg/dL (ref 70–99)
POTASSIUM: 4.4 meq/L (ref 3.7–5.3)
SODIUM: 141 meq/L (ref 137–147)

## 2013-11-05 LAB — CBC
HEMATOCRIT: 39.6 % (ref 36.0–46.0)
HEMOGLOBIN: 13.2 g/dL (ref 12.0–15.0)
MCH: 31.8 pg (ref 26.0–34.0)
MCHC: 33.3 g/dL (ref 30.0–36.0)
MCV: 95.4 fL (ref 78.0–100.0)
Platelets: 188 10*3/uL (ref 150–400)
RBC: 4.15 MIL/uL (ref 3.87–5.11)
RDW: 12.7 % (ref 11.5–15.5)
WBC: 7.3 10*3/uL (ref 4.0–10.5)

## 2013-11-05 LAB — TROPONIN I

## 2013-11-05 LAB — LIPID PANEL
CHOL/HDL RATIO: 2 ratio
CHOLESTEROL: 127 mg/dL (ref 0–200)
HDL: 65 mg/dL (ref >39)
LDL Cholesterol: 47 mg/dL (ref 0–99)
TRIGLYCERIDES: 75 mg/dL (ref <150)
VLDL: 15 mg/dL (ref 0–40)

## 2013-11-05 LAB — PROTIME-INR
INR: 1.03 (ref 0.00–1.49)
Prothrombin Time: 13.5 seconds (ref 11.6–15.2)

## 2013-11-05 LAB — HEPARIN LEVEL (UNFRACTIONATED)
Heparin Unfractionated: 0.46 IU/mL (ref 0.30–0.70)
Heparin Unfractionated: 0.36 IU/mL (ref 0.30–0.70)

## 2013-11-05 MED ORDER — TECHNETIUM TC 99M SESTAMIBI GENERIC - CARDIOLITE
30.0000 | Freq: Once | INTRAVENOUS | Status: AC | PRN
  Administered 2013-11-05: 30 via INTRAVENOUS

## 2013-11-05 MED ORDER — REGADENOSON 0.4 MG/5ML IV SOLN
INTRAVENOUS | Status: AC
  Administered 2013-11-05: 0.4 mg via INTRAVENOUS
  Filled 2013-11-05: qty 5

## 2013-11-05 MED ORDER — TECHNETIUM TC 99M SESTAMIBI GENERIC - CARDIOLITE
10.0000 | Freq: Once | INTRAVENOUS | Status: AC | PRN
  Administered 2013-11-05: 10 via INTRAVENOUS

## 2013-11-05 MED ORDER — REGADENOSON 0.4 MG/5ML IV SOLN
0.4000 mg | Freq: Once | INTRAVENOUS | Status: AC
  Administered 2013-11-05: 0.4 mg via INTRAVENOUS
  Filled 2013-11-05: qty 5

## 2013-11-05 MED ORDER — REGADENOSON 0.4 MG/5ML IV SOLN
0.4000 mg | Freq: Once | INTRAVENOUS | Status: DC
  Filled 2013-11-05: qty 5

## 2013-11-05 NOTE — Discharge Summary (Signed)
Physician Discharge Summary  Patient ID: Shannon Andrade MRN: [id number] DOB/AGE: 05/18/1955 58 y.o.  Admit date: 11/04/2013 Discharge date: 11/05/2013  Admission Diagnoses: Chest pain  PSVT  Depression  Discharge Diagnoses:  Principle Problem: * Chest pain at rest * PSVT  Depression  Discharged Condition: good  Hospital Course: 58 year old female with history of tachycardia, hypertension, depression who is brought here via EMS for evaluation of chest pain. She underwent nuclear stress test as her cardiac enzymes were normal. There was no reversible ischemia on stress test. She was discharged home in good condition and discuss medications with primary care physician.  Consults: cardiology  Significant Diagnostic Studies: labs: Normal CBC and cardiac enzymes. Normal Lipid panel.  Post PSVT treatment normal EKG, chest x-ray and Nuclear stress test.  Treatments: cardiac meds: metoprolol and amiodarone  Discharge Exam: Blood pressure 145/84, pulse 58, temperature 98.4 F (36.9 C), temperature source Oral, resp. rate 17, height 5\' 8"  (1.727 m), weight 99.7 kg (219 lb 12.8 oz), SpO2 98.00%. Physical Exam  Nursing note and vitals reviewed.  Constitutional: She appears well-developed and well-nourished. No distress.  HENT: Head: Atraumatic. Eyes: Conjunctivae are normal.  Neck: Neck supple.  Cardiovascular: Normal rate and regular rhythm.  Pulmonary/Chest: Effort normal and breath sounds normal.  Abdominal: Soft.  Musculoskeletal: She exhibits no edema. Bilateral lower extremities without palpable cords, erythema, edema, negative Homan sign.  Neurological: She is alert and oriented to person, place, and time.  Skin: No rash noted.  Psychiatric: She has a normal mood and affect.    Disposition: 01-Home or Self Care     Medication List         amiodarone 200 MG tablet  Commonly known as:  PACERONE  Take 200 mg by mouth daily as needed (for heart racing).     DULoxetine  30 MG capsule  Commonly known as:  CYMBALTA  Take 30 mg by mouth daily.     enalapril 10 MG tablet  Commonly known as:  VASOTEC  Take 10 mg by mouth daily.     ergocalciferol 50000 UNITS capsule  Commonly known as:  VITAMIN D2  Take 50,000 Units by mouth once a week. On Monday     ferrous sulfate 325 (65 FE) MG tablet  Take 325 mg by mouth daily with breakfast.     gabapentin 300 MG capsule  Commonly known as:  NEURONTIN  Take 1 capsule (300 mg total) by mouth 3 (three) times daily.     meclizine 25 MG tablet  Commonly known as:  ANTIVERT  Take 25 mg by mouth 3 (three) times daily as needed for dizziness.     metoprolol 50 MG tablet  Commonly known as:  LOPRESSOR  Take 1 tablet (50 mg total) by mouth 2 (two) times daily.     omeprazole 20 MG capsule  Commonly known as:  PRILOSEC  Take 20 mg by mouth daily.     pantoprazole 40 MG tablet  Commonly known as:  PROTONIX  Take 1 tablet (40 mg total) by mouth daily.     ranitidine 150 MG tablet  Commonly known as:  ZANTAC  Take 150 mg by mouth 2 (two) times daily.     traMADol-acetaminophen 37.5-325 MG per tablet  Commonly known as:  ULTRACET  Take 1 tablet by mouth every 8 (eight) hours as needed for moderate pain.           Follow-up Information   Follow up with Lorayne Marek, MD. Schedule an appointment  as soon as possible for a visit in 1 week.   Specialty:  Internal Medicine   Contact information:   [place] [address] [phone]       Follow up with [facility], MD. Schedule an appointment as soon as possible for a visit in 2 months.   Specialty:  Cardiology   Contact information:   [place] [address] [phone]       Signed: Birdie Riddle 11/05/2013, 5:52 PM

## 2013-11-05 NOTE — ED Provider Notes (Signed)
Medical screening examination/treatment/procedure(s) were performed by non-physician practitioner and as supervising physician I was immediately available for consultation/collaboration.  Date: 11/04/2013  Rate: 79  Rhythm: normal sinus rhythm  QRS Axis: normal  Intervals: normal  ST/T Wave abnormalities: normal  Conduction Disutrbances: none  Narrative Interpretation:  Old EKG Reviewed: No significant changes noted    EKG Interpretation None        Ernestina Patches, MD 11/05/13 1017

## 2013-11-05 NOTE — Progress Notes (Signed)
  Echocardiogram 2D Echocardiogram has been performed.  Shannon Andrade 11/05/2013, 3:54 PM

## 2013-11-05 NOTE — Progress Notes (Signed)
ANTICOAGULATION CONSULT NOTE - Follow Up Consult  Pharmacy Consult for Heparin Indication: chest pain/ACS  No Known Allergies  Patient Measurements: Height: 5\' 8"  (172.7 cm) Weight: 219 lb 12.8 oz (99.7 kg) IBW/kg (Calculated) : 63.9 Heparin Dosing Weight: 87 kg  Vital Signs: Temp: 98.1 F (36.7 C) (09/08 0547) Temp src: Oral (09/08 0547) BP: 129/84 mmHg (09/08 0547) Pulse Rate: 58 (09/08 0547)  Labs:  Recent Labs  11/04/13 1319 11/04/13 1917 11/05/13 0047 11/05/13 0141 11/05/13 0931  HGB 14.3  --  13.2  --   --   HCT 43.6  --  39.6  --   --   PLT 228  --  188  --   --   HEPARINUNFRC  --   --   --  0.46 0.36  TROPONINI  --  <0.30 <0.30  --  <0.30    Estimated Creatinine Clearance: 94.6 ml/min (by C-G formula based on Cr of 0.65).  Medications:  Heparin @ 1100 units/hr  Assessment: 58 yo female on heparin for chest pain/ACS rule out. Troponins have been negative. To get nuclear stress test today.  Heparin level therapeutic x2. CBC is WNL. No bleeding noted.   Goal of Therapy:  Heparin level 0.3-0.7 units/ml Monitor platelets by anticoagulation protocol: Yes   Plan:  1. Continue heparin 1100 units/hr 2. Daily HL and CBC 3. Follow stress test results/cardiology plans  Bjorn Hallas D. Ester Hilley, PharmD, BCPS Clinical Pharmacist Pager: [phone] 11/05/2013 10:36 AM

## 2013-11-05 NOTE — Progress Notes (Signed)
ANTICOAGULATION CONSULT NOTE Pharmacy Consult for Heparin Indication: chest pain/ACS  No Known Allergies  Patient Measurements: Height: 5\' 8"  (172.7 cm) Weight: 220 lb (99.791 kg) IBW/kg (Calculated) : 63.9 Heparin Dosing Weight: 87 kg  Vital Signs: Temp: 98.1 F (36.7 C) (09/07 2043) Temp src: Oral (09/07 2043) BP: 152/82 mmHg (09/07 2043) Pulse Rate: 65 (09/07 2043)  Labs:  Recent Labs  11/04/13 1319 11/04/13 1917 11/05/13 0047 11/05/13 0141  HGB 14.3  --  13.2  --   HCT 43.6  --  39.6  --   PLT 228  --  188  --   HEPARINUNFRC  --   --   --  0.46  TROPONINI  --  <0.30 <0.30  --     Estimated Creatinine Clearance: 94.7 ml/min (by C-G formula based on Cr of 0.65).  Assessment: 58 y.o. female with chest pain for heparin    Goal of Therapy:  Heparin level 0.3-0.7 units/ml Monitor platelets by anticoagulation protocol: Yes   Plan:  Continue Heparin at current rate  Follow-up am labs.  Phillis Knack, PharmD, BCPS  11/05/2013,2:52 AM

## 2013-11-13 ENCOUNTER — Encounter: Payer: Self-pay | Admitting: Gastroenterology

## 2013-11-13 ENCOUNTER — Ambulatory Visit (INDEPENDENT_AMBULATORY_CARE_PROVIDER_SITE_OTHER): Payer: Medicaid Other | Admitting: Gastroenterology

## 2013-11-13 VITALS — BP 110/68 | HR 72 | Ht 65.75 in | Wt 231.5 lb

## 2013-11-13 DIAGNOSIS — R079 Chest pain, unspecified: Secondary | ICD-10-CM

## 2013-11-13 MED ORDER — SUCRALFATE 1 GM/10ML PO SUSP: 1.0000 g | Freq: Four times a day (QID) | ORAL | Status: DC

## 2013-11-13 NOTE — Assessment & Plan Note (Signed)
Spontaneous and postprandial chest pain is probably of GI origin.  She may have free reflux despite PPI therapy.  She may have an early esophageal stricture.  Pain from chronic cholecystitis is also a possibility.  Recommendations #1 upper endoscopy #2 add Carafate slurry #3 to consider abdominal ultrasound pending results of above

## 2013-11-13 NOTE — Progress Notes (Signed)
_                                                                                                                History of Present Illness:  Ms. Shannon Andrade is a 58 year old Hispanic female referred for evaluation of chest pain.  History was obtained through the interpreter.  She was seen several days ago in the ED for burning chest pain.  Cardiac markers were negative.  She has pain both postprandially and spontaneously.  This is despite taking several PPIs.  She may have occasional dysphagia.  Patient is status post gastric bypass approximately 5 years ago.  Pain may radiate to the back.  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   Past Medical History  Diagnosis Date  . Hypertension   . Kidney stones   . Depression   . Tachycardia   . Angiomyolipoma of right kidney   . GERD (gastroesophageal reflux disease)   . Type II diabetes mellitus     "took Metformin"; resolved S/P gastric bypass  . Anemia   . Hepatitis     "when I was pregnant; got the one that's not bad"   . Migraine     "q few months" (11/04/2013)  . Arthritis     "hands, feet, back, neck" (11/04/2013)  . DDD (degenerative disc disease)   . Chronic back pain     "neck down into lower back" (11/04/2013)   Past Surgical History  Procedure Laterality Date  . Gastric bypass  2008  . Appendectomy  ~ 1972  . Reduction mammaplasty Bilateral ~ 2010  . Kidney stone surgery  ~ 2004    "cut them out"   family history includes Diabetes in her mother; Heart disease in her father. Current Outpatient Prescriptions  Medication Sig Dispense Refill  . amiodarone (PACERONE) 200 MG tablet Take 200 mg by mouth daily as needed (for heart racing).      . DULoxetine (CYMBALTA) 30 MG capsule Take 30 mg by mouth daily.      . enalapril (VASOTEC) 10 MG tablet Take 10 mg by mouth daily.      . ergocalciferol (VITAMIN D2) 50000 UNITS capsule Take 50,000 Units by mouth once a week. On Monday      .  ferrous sulfate 325 (65 FE) MG tablet Take 325 mg by mouth daily with breakfast.      . gabapentin (NEURONTIN) 300 MG capsule Take 1 capsule (300 mg total) by mouth 3 (three) times daily.  90 capsule  3  . meclizine (ANTIVERT) 25 MG tablet Take 25 mg by mouth 3 (three) times daily as needed for dizziness.      . metoprolol (LOPRESSOR) 50 MG tablet Take 1 tablet (50 mg total) by mouth 2 (two) times daily.  60 tablet  2  . omeprazole (PRILOSEC) 20 MG capsule Take 20 mg by mouth daily.      . pantoprazole (PROTONIX) 40 MG tablet Take 1 tablet (40 mg total) by mouth daily.  30 tablet  3  . ranitidine (ZANTAC) 150 MG tablet Take 150 mg by mouth 2 (two) times daily.      . traMADol-acetaminophen (ULTRACET) 37.5-325 MG per tablet Take 1 tablet by mouth every 8 (eight) hours as needed for moderate pain.       No current facility-administered medications for this visit.   Allergies as of 11/13/2013  . (No Known Allergies)    reports that she has been smoking Cigarettes.  She has a 10.75 pack-year smoking history. She has never used smokeless tobacco. She reports that she drinks about 7.2 ounces of alcohol per week. She reports that she does not use illicit drugs.   Review of Systems: Pertinent positive and negative review of systems were noted in the above HPI section. All other review of systems were otherwise negative.  Vital signs were reviewed in today's medical record Physical Exam: General: Well developed , well nourished, no acute distress Skin: anicteric Head: Normocephalic and atraumatic Eyes:  sclerae anicteric, EOMI Ears: Normal auditory acuity Mouth: No deformity or lesions Neck: Supple, no masses or thyromegaly Lungs: Clear throughout to auscultation Heart: Regular rate and rhythm; no murmurs, rubs or bruits Abdomen: Soft, non tender and non distended. No masses, hepatosplenomegaly or hernias noted. Normal Bowel sounds Rectal:deferred Musculoskeletal: Symmetrical with no gross  deformities  Skin: No lesions on visible extremities Pulses:  Normal pulses noted Extremities: No clubbing, cyanosis, edema or deformities noted Neurological: Alert oriented x 4, grossly nonfocal Cervical Nodes:  No significant cervical adenopathy Inguinal Nodes: No significant inguinal adenopathy Psychological:  Alert and cooperative. Normal mood and affect  See Assessment and Plan under Problem List

## 2013-11-13 NOTE — Patient Instructions (Addendum)
You have been given a separate informational sheet regarding your tobacco use, the importance of quitting and local resources to help you quit.  You have been scheduled for an endoscopy. Please follow written instructions given to you at your visit today. If you use inhalers (even only as needed), please bring them with you on the day of your procedure.   We have sent the following medications to your pharmacy for you to pick up at your convenience: Carafate

## 2013-11-21 ENCOUNTER — Encounter (HOSPITAL_COMMUNITY): Payer: Self-pay | Admitting: *Deleted

## 2013-11-21 ENCOUNTER — Other Ambulatory Visit: Payer: Self-pay

## 2013-11-21 DIAGNOSIS — R0789 Other chest pain: Secondary | ICD-10-CM

## 2013-11-22 ENCOUNTER — Encounter: Payer: Self-pay | Admitting: Gastroenterology

## 2013-11-28 ENCOUNTER — Encounter (HOSPITAL_COMMUNITY): Payer: Self-pay | Admitting: Gastroenterology

## 2013-11-28 ENCOUNTER — Ambulatory Visit (HOSPITAL_COMMUNITY): Payer: Medicaid Other | Admitting: Anesthesiology

## 2013-11-28 ENCOUNTER — Other Ambulatory Visit: Payer: Self-pay

## 2013-11-28 ENCOUNTER — Ambulatory Visit (HOSPITAL_COMMUNITY)
Admission: RE | Admit: 2013-11-28 | Discharge: 2013-11-28 | Disposition: A | Discharge status: Home / Self Care | Payer: Medicaid Other | Source: Ambulatory Visit | Attending: Gastroenterology | Admitting: Gastroenterology

## 2013-11-28 ENCOUNTER — Encounter (HOSPITAL_COMMUNITY)
Admission: RE | Disposition: A | Discharge status: Home / Self Care | Payer: Self-pay | Source: Ambulatory Visit | Attending: Gastroenterology

## 2013-11-28 ENCOUNTER — Encounter (HOSPITAL_COMMUNITY): Payer: Medicaid Other | Admitting: Anesthesiology

## 2013-11-28 DIAGNOSIS — R0789 Other chest pain: Secondary | ICD-10-CM

## 2013-11-28 DIAGNOSIS — Z9889 Other specified postprocedural states: Secondary | ICD-10-CM | POA: Diagnosis not present

## 2013-11-28 DIAGNOSIS — F329 Major depressive disorder, single episode, unspecified: Secondary | ICD-10-CM | POA: Diagnosis not present

## 2013-11-28 DIAGNOSIS — D649 Anemia, unspecified: Secondary | ICD-10-CM | POA: Insufficient documentation

## 2013-11-28 DIAGNOSIS — F17200 Nicotine dependence, unspecified, uncomplicated: Secondary | ICD-10-CM | POA: Diagnosis not present

## 2013-11-28 DIAGNOSIS — R1314 Dysphagia, pharyngoesophageal phase: Secondary | ICD-10-CM | POA: Diagnosis present

## 2013-11-28 DIAGNOSIS — Z79899 Other long term (current) drug therapy: Secondary | ICD-10-CM | POA: Insufficient documentation

## 2013-11-28 DIAGNOSIS — E119 Type 2 diabetes mellitus without complications: Secondary | ICD-10-CM | POA: Diagnosis not present

## 2013-11-28 DIAGNOSIS — I10 Essential (primary) hypertension: Secondary | ICD-10-CM | POA: Diagnosis not present

## 2013-11-28 DIAGNOSIS — R000 Tachycardia, unspecified: Secondary | ICD-10-CM | POA: Insufficient documentation

## 2013-11-28 DIAGNOSIS — R1013 Epigastric pain: Secondary | ICD-10-CM | POA: Diagnosis present

## 2013-11-28 DIAGNOSIS — G8929 Other chronic pain: Secondary | ICD-10-CM | POA: Insufficient documentation

## 2013-11-28 DIAGNOSIS — M549 Dorsalgia, unspecified: Secondary | ICD-10-CM | POA: Diagnosis not present

## 2013-11-28 HISTORY — PX: ESOPHAGOGASTRODUODENOSCOPY (EGD) WITH PROPOFOL: SHX5813

## 2013-11-28 LAB — GLUCOSE, CAPILLARY: GLUCOSE-CAPILLARY: 90 mg/dL (ref 70–99)

## 2013-11-28 SURGERY — ESOPHAGOGASTRODUODENOSCOPY (EGD) WITH PROPOFOL
Anesthesia: Monitor Anesthesia Care

## 2013-11-28 MED ORDER — LACTATED RINGERS IV SOLN
INTRAVENOUS | Status: DC | PRN
  Administered 2013-11-28: 12:00:00 via INTRAVENOUS

## 2013-11-28 MED ORDER — SODIUM CHLORIDE 0.9 % IV SOLN: INTRAVENOUS | Status: DC

## 2013-11-28 MED ORDER — PROPOFOL INFUSION 10 MG/ML OPTIME
INTRAVENOUS | Status: DC | PRN
Start: 2013-11-28 — End: 2013-11-28
  Administered 2013-11-28: 300 ug/kg/min via INTRAVENOUS

## 2013-11-28 MED ORDER — HYOSCYAMINE SULFATE ER 0.375 MG PO TBCR: EXTENDED_RELEASE_TABLET | ORAL | Status: DC

## 2013-11-28 MED ORDER — LACTATED RINGERS IV SOLN
INTRAVENOUS | Status: DC
  Administered 2013-11-28: 1000 mL via INTRAVENOUS

## 2013-11-28 MED ORDER — LIDOCAINE HCL (CARDIAC) 20 MG/ML IV SOLN
INTRAVENOUS | Status: DC | PRN
  Administered 2013-11-28: 50 mg via INTRAVENOUS

## 2013-11-28 SURGICAL SUPPLY — 14 items

## 2013-11-28 NOTE — Transfer of Care (Signed)
Immediate Anesthesia Transfer of Care Note  Patient: Shannon Andrade  Procedure(s) Performed: Procedure(s): ESOPHAGOGASTRODUODENOSCOPY (EGD) WITH PROPOFOL (N/A)  Patient Location: PACU and Endoscopy Unit  Anesthesia Type:MAC  Level of Consciousness: awake and patient cooperative  Airway & Oxygen Therapy: Patient Spontanous Breathing and Patient connected to nasal cannula oxygen  Post-op Assessment: Report given to PACU RN and Post -op Vital signs reviewed and stable  Post vital signs: Reviewed and stable  Complications: No apparent anesthesia complications

## 2013-11-28 NOTE — H&P (View-Only) (Signed)
_                                                                                                                History of Present Illness:  Shannon Andrade is a 58 year old Hispanic female referred for evaluation of chest pain.  History was obtained through the interpreter.  She was seen several days ago in the ED for burning chest pain.  Cardiac markers were negative.  She has pain both postprandially and spontaneously.  This is despite taking several PPIs.  She may have occasional dysphagia.  Patient is status post gastric bypass approximately 5 years ago.  Pain may radiate to the back.  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   Past Medical History  Diagnosis Date  . Hypertension   . Kidney stones   . Depression   . Tachycardia   . Angiomyolipoma of right kidney   . GERD (gastroesophageal reflux disease)   . Type II diabetes mellitus     "took Metformin"; resolved S/P gastric bypass  . Anemia   . Hepatitis     "when I was pregnant; got the one that's not bad"   . Migraine     "q few months" (11/04/2013)  . Arthritis     "hands, feet, back, neck" (11/04/2013)  . DDD (degenerative disc disease)   . Chronic back pain     "neck down into lower back" (11/04/2013)   Past Surgical History  Procedure Laterality Date  . Gastric bypass  2008  . Appendectomy  ~ 1972  . Reduction mammaplasty Bilateral ~ 2010  . Kidney stone surgery  ~ 2004    "cut them out"   family history includes Diabetes in her mother; Heart disease in her father. Current Outpatient Prescriptions  Medication Sig Dispense Refill  . amiodarone (PACERONE) 200 MG tablet Take 200 mg by mouth daily as needed (for heart racing).      . DULoxetine (CYMBALTA) 30 MG capsule Take 30 mg by mouth daily.      . enalapril (VASOTEC) 10 MG tablet Take 10 mg by mouth daily.      . ergocalciferol (VITAMIN D2) 50000 UNITS capsule Take 50,000 Units by mouth once a week. On Monday      .  ferrous sulfate 325 (65 FE) MG tablet Take 325 mg by mouth daily with breakfast.      . gabapentin (NEURONTIN) 300 MG capsule Take 1 capsule (300 mg total) by mouth 3 (three) times daily.  90 capsule  3  . meclizine (ANTIVERT) 25 MG tablet Take 25 mg by mouth 3 (three) times daily as needed for dizziness.      . metoprolol (LOPRESSOR) 50 MG tablet Take 1 tablet (50 mg total) by mouth 2 (two) times daily.  60 tablet  2  . omeprazole (PRILOSEC) 20 MG capsule Take 20 mg by mouth daily.      . pantoprazole (PROTONIX) 40 MG tablet Take 1 tablet (40 mg total) by mouth daily.  30 tablet  3  . ranitidine (ZANTAC) 150 MG tablet Take 150 mg by mouth 2 (two) times daily.      . traMADol-acetaminophen (ULTRACET) 37.5-325 MG per tablet Take 1 tablet by mouth every 8 (eight) hours as needed for moderate pain.       No current facility-administered medications for this visit.   Allergies as of 11/13/2013  . (No Known Allergies)    reports that she has been smoking Cigarettes.  She has a 10.75 pack-year smoking history. She has never used smokeless tobacco. She reports that she drinks about 7.2 ounces of alcohol per week. She reports that she does not use illicit drugs.   Review of Systems: Pertinent positive and negative review of systems were noted in the above HPI section. All other review of systems were otherwise negative.  Vital signs were reviewed in today's medical record Physical Exam: General: Well developed , well nourished, no acute distress Skin: anicteric Head: Normocephalic and atraumatic Eyes:  sclerae anicteric, EOMI Ears: Normal auditory acuity Mouth: No deformity or lesions Neck: Supple, no masses or thyromegaly Lungs: Clear throughout to auscultation Heart: Regular rate and rhythm; no murmurs, rubs or bruits Abdomen: Soft, non tender and non distended. No masses, hepatosplenomegaly or hernias noted. Normal Bowel sounds Rectal:deferred Musculoskeletal: Symmetrical with no gross  deformities  Skin: No lesions on visible extremities Pulses:  Normal pulses noted Extremities: No clubbing, cyanosis, edema or deformities noted Neurological: Alert oriented x 4, grossly nonfocal Cervical Nodes:  No significant cervical adenopathy Inguinal Nodes: No significant inguinal adenopathy Psychological:  Alert and cooperative. Normal mood and affect  See Assessment and Plan under Problem List

## 2013-11-28 NOTE — Anesthesia Preprocedure Evaluation (Signed)
Anesthesia Evaluation  Patient identified by MRN, date of birth, ID band Patient awake    Reviewed: Allergy & Precautions, H&P , NPO status , Patient's Chart, lab work & pertinent test results  Airway Mallampati: II TM Distance: <3 FB Neck ROM: Full    Dental no notable dental hx.    Pulmonary Current Smoker,  breath sounds clear to auscultation  Pulmonary exam normal       Cardiovascular hypertension, Pt. on medications and Pt. on home beta blockers Rhythm:Regular Rate:Normal     Neuro/Psych negative neurological ROS  negative psych ROS   GI/Hepatic Neg liver ROS, GERD-  Medicated,  Endo/Other  Morbid obesity  Renal/GU negative Renal ROS  negative genitourinary   Musculoskeletal negative musculoskeletal ROS (+)   Abdominal   Peds negative pediatric ROS (+)  Hematology negative hematology ROS (+)   Anesthesia Other Findings   Reproductive/Obstetrics negative OB ROS                           Anesthesia Physical Anesthesia Plan  ASA: III  Anesthesia Plan: MAC   Post-op Pain Management:    Induction: Intravenous  Airway Management Planned: Nasal Cannula  Additional Equipment:   Intra-op Plan:   Post-operative Plan:   Informed Consent: I have reviewed the patients History and Physical, chart, labs and discussed the procedure including the risks, benefits and alternatives for the proposed anesthesia with the patient or authorized representative who has indicated his/her understanding and acceptance.   Dental advisory given  Plan Discussed with: CRNA and Surgeon  Anesthesia Plan Comments:         Anesthesia Quick Evaluation

## 2013-11-28 NOTE — Anesthesia Postprocedure Evaluation (Signed)
  Anesthesia Post-op Note  Patient: Shannon Andrade  Procedure(s) Performed: Procedure(s) (LRB): ESOPHAGOGASTRODUODENOSCOPY (EGD) WITH PROPOFOL (N/A)  Patient Location: PACU  Anesthesia Type: MAC  Level of Consciousness: awake and alert   Airway and Oxygen Therapy: Patient Spontanous Breathing  Post-op Pain: mild  Post-op Assessment: Post-op Vital signs reviewed, Patient's Cardiovascular Status Stable, Respiratory Function Stable, Patent Airway and No signs of Nausea or vomiting  Last Vitals:  Filed Vitals:   11/28/13 1302  BP: 133/85  Pulse: 62  Temp:   Resp: 17    Post-op Vital Signs: stable   Complications: No apparent anesthesia complications

## 2013-11-28 NOTE — Interval H&P Note (Signed)
History and Physical Interval Note:  11/28/2013 12:36 PM  [place]  has presented today for surgery, with the diagnosis of chest pain  The various methods of treatment have been discussed with the patient and family. After consideration of risks, benefits and other options for treatment, the patient has consented to  Procedure(s): ESOPHAGOGASTRODUODENOSCOPY (EGD) WITH PROPOFOL (N/A) as a surgical intervention .  The patient's history has been reviewed, patient examined, no change in status, stable for surgery.  I have reviewed the patient's chart and labs.  Questions were answered to the patient's satisfaction.     The recent H&P (dated *11/13/13**) was reviewed, the patient was examined and there is no change in the patients condition since that H&P was completed.   Erskine Emery  11/28/2013, 12:36 PM   Erskine Emery

## 2013-11-29 ENCOUNTER — Encounter (HOSPITAL_COMMUNITY): Payer: Self-pay | Admitting: Gastroenterology

## 2013-12-04 ENCOUNTER — Ambulatory Visit: Payer: Self-pay

## 2013-12-04 NOTE — Op Note (Signed)
[facility] [place], [address]   ENDOSCOPY PROCEDURE REPORT  PATIENT: Shannon Andrade, Shannon Andrade  MR#: [id number] BIRTHDATE: March 25, 1955 , 49  yrs. old GENDER: female ENDOSCOPIST: Inda Castle, MD REFERRED BY:  Renato Shin, M.D. PROCEDURE DATE:  12-13-13 PROCEDURE:  EGD, diagnostic ASA CLASS:     Class II INDICATIONS:  epigastric abdominal pain. MEDICATIONS: Monitored anesthesia care TOPICAL ANESTHETIC:  DESCRIPTION OF PROCEDURE: After the risks benefits and alternatives of the procedure were thoroughly explained, informed consent was obtained.  The Pentax Gastroscope [id number] endoscope was introduced through the mouth and advanced to the proximal jejunum , Without limitations.  The instrument was slowly withdrawn as the mucosa was fully examined.    ESOPHAGUS: The esophagus and gastroesophageal junction were completely normal in appearance.    Patient is status post gastric bypass.  A small gastric remnant was present.  gastrojejunostomy and proximal jejunum were normal.  Retroflexion was not performed. The scope was then withdrawn from the patient and the procedure completed.  COMPLICATIONS: There were no immediate complications.  ENDOSCOPIC IMPRESSION: 1.   Normal endoscopy several postoperative changes  RECOMMENDATIONS: continue current therapy hyomax as needed OV 1 month  REPEAT EXAM:  eSigned:  Inda Castle, MD 12-13-13 1:06 PM    CC:  CPT CODES: ICD CODES:  The ICD and CPT codes recommended by this software are interpretations from the data that the clinical staff has captured with the software.  The verification of the translation of this report to the ICD and CPT codes and modifiers is the sole responsibility of the health care institution and practicing physician where this report was generated.  [employer] will not be held responsible for the validity of the ICD and CPT codes included on  this report.  AMA assumes no liability for data contained or not contained herein. CPT is a Designer, television/film set of the [employer].

## 2013-12-16 ENCOUNTER — Encounter: Payer: Self-pay | Admitting: Cardiology

## 2013-12-27 ENCOUNTER — Encounter: Payer: Self-pay | Admitting: Nurse Practitioner

## 2013-12-27 ENCOUNTER — Ambulatory Visit (INDEPENDENT_AMBULATORY_CARE_PROVIDER_SITE_OTHER): Payer: Self-pay | Admitting: Nurse Practitioner

## 2013-12-27 VITALS — BP 122/72 | HR 64 | Ht 65.75 in | Wt 234.1 lb

## 2013-12-27 DIAGNOSIS — R0789 Other chest pain: Secondary | ICD-10-CM

## 2013-12-27 MED ORDER — SULINDAC 150 MG PO TABS: 150.0000 mg | ORAL_TABLET | Freq: Two times a day (BID) | ORAL | Status: AC

## 2013-12-27 NOTE — Patient Instructions (Signed)
We sent a prescription for Clinoril 150 mg to [facility]. Continue the Pantoprazole sodium 40 mg.   In 7- 10 days ;Call us at [phone] and ask for nurse Rollene Fare with a progress report- we want to know how you are doing.

## 2013-12-27 NOTE — Progress Notes (Signed)
     History of Present Illness:  Patient is a 58 year old female known to Dr. Deatra Ina, she saw him for evaluation of chest pain the mid September. Cardiac markers from prior emergency department visit were negative. EGD 11/28/13 was normal. Patient is status post gastric bypass. Her gastrojejunostomy and proximal jejunum were normal. Post EGD patient was prescribed Hyoscyamine. Patient is here for EGD followup. History obtained through interpreter. Patient continues to have chest pain. The pain is exacerbated by deep breaths, stretching and other physical activity. Hyoscyamine provided minimal relief.    Current Medications, Allergies, Past Medical History, Past Surgical History, Family History and Social History were reviewed in [employer] record.  Physical Exam: General: Pleasant, well developed , Spanish female in no acute distress Head: Normocephalic and atraumatic Eyes:  sclerae anicteric, conjunctiva pink  Ears: Normal auditory acuity Lungs: Clear throughout to auscultation Heart: Regular rate and rhythm Abdomen: Soft, non distended, non-tender. No masses, no hepatomegaly. Normal bowel sounds Musculoskeletal: Sternum significantly tender to light palpation.  Extremities: No edema  Neurological: Alert oriented x 4, grossly nonfocal Psychological:  Alert and cooperative. Normal mood and affect  Assessment and Recommendations:   58 year old non-English speaking female with persistent chest pain. EGD negative. Hyoscyamine gave minimal relief. She is on a daily PPI. History and exam most c/w musculoskeletal pain, we had an extensive conversation about this. Will try Clinoril BID for 10 days. Continue PPI, especially during trial of NSAIDS. Patient will call up in 10 days with condtition update.

## 2013-12-29 ENCOUNTER — Encounter: Payer: Self-pay | Admitting: Nurse Practitioner

## 2013-12-30 DIAGNOSIS — R0789 Other chest pain: Secondary | ICD-10-CM | POA: Insufficient documentation

## 2013-12-31 NOTE — Progress Notes (Signed)
Reviewed and agree with management. Raquell Richer D. Asjia Berrios, M.D., FACG  

## 2014-01-09 ENCOUNTER — Encounter (HOSPITAL_COMMUNITY): Payer: Self-pay

## 2014-01-09 ENCOUNTER — Ambulatory Visit
Admission: RE | Admit: 2014-01-09 | Discharge: 2014-01-09 | Disposition: A | Discharge status: Home / Self Care | Payer: No Typology Code available for payment source | Source: Ambulatory Visit | Attending: Obstetrics and Gynecology | Admitting: Obstetrics and Gynecology

## 2014-01-09 ENCOUNTER — Ambulatory Visit (HOSPITAL_COMMUNITY)
Admit: 2014-01-09 | Discharge: 2014-01-09 | Disposition: A | Discharge status: Home / Self Care | Payer: MEDICAID | Attending: Obstetrics and Gynecology | Admitting: Obstetrics and Gynecology

## 2014-01-09 VITALS — BP 124/80 | Ht 68.0 in | Wt 234.2 lb

## 2014-01-09 DIAGNOSIS — N6320 Unspecified lump in the left breast, unspecified quadrant: Secondary | ICD-10-CM

## 2014-01-09 DIAGNOSIS — Z1239 Encounter for other screening for malignant neoplasm of breast: Secondary | ICD-10-CM

## 2014-01-09 NOTE — Patient Instructions (Addendum)
Explained to [facility] that she did not need a Pap smear today due to last Pap smear was in August 2013 per patient. Let her know BCCCP will cover Pap smears every 3 years unless has a history of abnormal Pap smears. Referred patient to the [facility] for diagnostic mammogram. Appointment scheduled for Thursday, January 09, 2014 at 1330. Patient aware of appointment and will be there. Smoking cessation discussed with patient and resources given. [place] verbalized understanding.  Darolyn Double, Arvil Chaco, RN 1:12 PM

## 2014-01-09 NOTE — Progress Notes (Addendum)
Complaints of left breast pain x 4 months that comes and goes. Patient rates pain at a 8 out of 10. Patient stated she occasionally has some mild pain in her right breast.  Pap Smear:  Pap smear not completed today. Last Pap smear was in August 2013 in [place] and normal per patient. Per patient has no history of an abnormal Pap smear. No Pap smear results are in EPIC. Physical exam: Breasts Breasts symmetrical. Bilateral lower breast scars from history of breast reduction surgery 4 years ago. No nipple retraction bilateral breasts. No nipple discharge bilateral breasts. No lymphadenopathy. No lumps palpated bilateral breasts. Complaints of left lower outer and center breast tenderness on exam. Complaints of right lower center breast tenderness on exam. Referred patient to the [facility] for diagnostic mammogram. Appointment scheduled for Thursday, January 09, 2014 at 1330      Pelvic/Bimanual No Pap smear completed today since last Pap smear was in August 2013 per patient. Pap smear not indicated per BCCCP guidelines.   Smoking cessation discussed with patient. Referred patient to the [facility] Quitline and gave resources to free smoking cessation classes offered at the [facility].

## 2014-01-09 NOTE — Addendum Note (Signed)
Encounter addended by: Loletta Parish, RN on: 01/09/2014  1:19 PM<BR>     Documentation filed: Notes Section, Patient Instructions Section

## 2014-02-03 ENCOUNTER — Encounter: Payer: Self-pay | Admitting: Internal Medicine

## 2014-02-03 ENCOUNTER — Ambulatory Visit: Payer: Medicaid Other | Attending: Internal Medicine | Admitting: Internal Medicine

## 2014-02-03 VITALS — BP 147/89 | HR 76 | Temp 98.0°F | Resp 16 | Wt 236.0 lb

## 2014-02-03 DIAGNOSIS — I10 Essential (primary) hypertension: Secondary | ICD-10-CM | POA: Diagnosis present

## 2014-02-03 DIAGNOSIS — R32 Unspecified urinary incontinence: Secondary | ICD-10-CM | POA: Insufficient documentation

## 2014-02-03 DIAGNOSIS — M25562 Pain in left knee: Secondary | ICD-10-CM | POA: Insufficient documentation

## 2014-02-03 DIAGNOSIS — M25561 Pain in right knee: Secondary | ICD-10-CM | POA: Diagnosis not present

## 2014-02-03 DIAGNOSIS — F17200 Nicotine dependence, unspecified, uncomplicated: Secondary | ICD-10-CM | POA: Insufficient documentation

## 2014-02-03 DIAGNOSIS — Z79899 Other long term (current) drug therapy: Secondary | ICD-10-CM | POA: Diagnosis not present

## 2014-02-03 DIAGNOSIS — N39498 Other specified urinary incontinence: Secondary | ICD-10-CM

## 2014-02-03 MED ORDER — DICLOFENAC SODIUM 75 MG PO TBEC: 75.0000 mg | DELAYED_RELEASE_TABLET | Freq: Two times a day (BID) | ORAL | Status: DC

## 2014-02-03 MED ORDER — OXYBUTYNIN CHLORIDE ER 5 MG PO TB24: 5.0000 mg | ORAL_TABLET | Freq: Every day | ORAL | Status: DC

## 2014-02-03 NOTE — Patient Instructions (Signed)
Plan de alimentacin DASH (DASH Eating Plan) DASH es la sigla en ingls de "Enfoques Alimentarios para Detener la Hipertensin". El plan de alimentacin DASH ha demostrado bajar la presin arterial elevada (hipertensin). Los beneficios adicionales para la salud pueden incluir la disminucin del riesgo de diabetes mellitus tipo2, enfermedades cardacas e ictus. Este plan tambin puede ayudar a Horticulturist, commercial. QU DEBO SABER ACERCA DEL PLAN DE ALIMENTACIN DASH? Para el plan de alimentacin DASH, seguir las siguientes pautas generales:  Elija los alimentos con un valor porcentual diario de sodio de menos del 5% (segn figura en la etiqueta del alimento).  Use hierbas o aderezos sin sal, en lugar de sal de mesa o sal marina.  Consulte al mdico o farmacutico antes de usar sustitutos de la sal.  Coma productos con bajo contenido de sodio, cuya etiqueta suele decir "bajo contenido de sodio" o "sin agregado de sal".  Coma alimentos frescos.  Coma ms verduras, frutas y productos lcteos con bajo contenido de [place].  Elija los cereales integrales. Busque la palabra "integral" en Equities trader de la lista de ingredientes.  Elija el pescado y el pollo o el pavo sin piel ms a menudo que las carnes rojas. Limite el consumo de pescado, carne de ave y carne a 6onzas (170g) por Training and development officer.  Limite el consumo de dulces, postres, azcares y bebidas azucaradas.  Elija las grasas saludables para el corazn.  Limite el consumo de queso a 1onza (28g) por Training and development officer.  Consuma ms comida casera y menos de restaurante, de buf y comida rpida.  Limite el consumo de alimentos fritos.  Cocine los alimentos utilizando mtodos que no sean la fritura.  Limite las verduras enlatadas. Si las consume, enjuguelas bien para disminuir el sodio.  Cuando coma en un restaurante, pida que preparen su comida con menos sal o, en lo posible, sin nada de sal. QU ALIMENTOS PUEDO COMER? Pida ayuda a un nutricionista para  conocer las necesidades calricas individuales. Cereales Pan de salvado o integral. Arroz integral. Pastas de salvado o integrales. Quinua, trigo burgol y cereales integrales. Cereales con bajo contenido de sodio. Tortillas de harina de maz o de salvado. Pan de maz integral. Galletas saladas integrales. Galletas con bajo contenido de [place]. Vegetales Verduras frescas o congeladas (crudas, al vapor, asadas o grilladas). Jugos de tomate y verduras con contenido bajo o reducido de sodio. Pasta y salsa de tomate con contenido bajo o [place]. Verduras enlatadas con bajo contenido de sodio o reducido de sodio.  Lambert Mody Lambert Mody frescas, en conserva (en su jugo natural) o frutas congeladas. Carnes y otros productos con protenas Carne de res molida (al 85% o ms [place]), carne de res de animales alimentados con pastos o carne de res sin la grasa. Pollo o pavo sin piel. Carne de pollo o de [place]. Cerdo sin la grasa. Todos los pescados y frutos de mar. Huevos. Porotos, guisantes o lentejas secos. Frutos secos y semillas sin sal. Frijoles enlatados sin sal. Lcteos Productos lcteos con bajo contenido de grasas, como [place] o al 1%, quesos reducidos en grasas o al 2%, ricota con bajo contenido de grasas o [employer], o yogur natural con bajo contenido de [place]. Quesos con contenido bajo o reducido de sodio. Grasas y Naval architect en barra que no contengan grasas trans. [place]ones[place] y alios para ensaladas livianos o reducidos en grasas (reducidos en sodio). [place]. Aceites de crtamo, oliva o canola. [place] natural de man o almendra. Otros Palomitas de maz y pretzels sin sal.  Los artculos mencionados arriba pueden no ser [employer] Company de las bebidas o los alimentos recomendados. Comunquese con el nutricionista para conocer ms opciones. QU ALIMENTOS NO SE RECOMIENDAN? Cereales Pan blanco. Pastas blancas. Arroz blanco. Pan de maz refinado. Bagels y  croissants. Galletas saladas que contengan grasas trans. Vegetales Vegetales con crema o fritos. Verduras en [place]. Verduras enlatadas comunes. Pasta y salsa de tomate en lata comunes. Jugos comunes de tomate y de verduras. Lambert Mody Frutas secas. Fruta enlatada en almbar liviano o espeso. Jugo de frutas. Carnes y otros productos con protenas [place] de carne con Lobbyist. Costillas, alas de pollo, tocineta, salchicha, mortadela, salame, chinchulines, tocino, perros calientes, salchichas alemanas y embutidos envasados. Frutos secos y semillas con sal. Frijoles con sal en lata. Lcteos Leche entera o al 2%, crema, mezcla de [place] y crema, y queso crema. Yogur entero o endulzado. Quesos o queso azul con alto contenido de Physicist, medical. [place]remas no lcteas y coberturas batidas. Quesos procesados, quesos para untar o cuajadas. Condimentos Sal de cebolla y ajo, sal condimentada, sal de mesa y sal marina. Salsas en lata y envasadas. Salsa [place]. Salsa trtara. Salsa barbacoa. Salsa teriyaki. Salsa de soja, incluso la que tiene contenido reducido de [place]. Salsa de carne. Salsa de pescado. Salsa de [place]. Salsa rosada. Rbano picante. Ketchup y mostaza. Saborizantes y tiernizantes para carne. Caldo en cubitos. Salsa picante. Salsa tabasco. Adobos. Aderezos para tacos. Salsas. Grasas y aceites [place], [place] en barra, [place] de [place], [place], [place] clarificada y Wendee Copp de tocino. Aceites de coco, de palmiste o de palma. Aderezos comunes para ensalada. Otros Pickles y [employer]. Palomitas de maz y pretzels con sal. Los artculos mencionados arriba pueden no ser [employer] de las bebidas y los alimentos que se Higher education careers adviser. Comunquese con el nutricionista para obtener ms informacin. DNDE Dolan Amen MS INFORMACIN? [place], del Pulmn y de la Sangre (National Heart, Lung, and [place]):  travelstabloid.com Document Released: 02/03/2011 Document Revised: 07/01/2013 [facility] Patient Information 2015 [place], [place]. This information is not intended to replace advice given to you by your health care provider. Make sure you discuss any questions you have with your health care provider.

## 2014-02-03 NOTE — Progress Notes (Signed)
MRN: [id number] Name: Shannon Andrade  Sex: female Age: 58 y.o. DOB: 05-11-1955  Allergies: Review of patient's allergies indicates no known allergies.  Chief Complaint  Patient presents with  . Follow-up    HPI: Patient is 58 y.o. female who has history of hypertension, chronic bilateral knee pain comes today requesting pain medication as per patient she was prescribed diclofenac in her home country and would like to have a prescription here, she denies any recent fall or trauma, patient also complaining of urinary incontinence denies any fever chills any dysuria but does have some urgency, she also has urinary incontinence when she cough she is requesting referral to see urology.  Past Medical History  Diagnosis Date  . Hypertension   . Kidney stones   . Depression   . Tachycardia   . Angiomyolipoma of right kidney   . GERD (gastroesophageal reflux disease)   . Type II diabetes mellitus     "took Metformin"; resolved S/P gastric bypass  . Anemia   . Hepatitis     "when I was pregnant; got the one that's not bad"   . Migraine     "q few months" (11/04/2013)  . Arthritis     "hands, feet, back, neck" (11/04/2013)  . DDD (degenerative disc disease)   . Chronic back pain     "neck down into lower back" (11/04/2013)    Past Surgical History  Procedure Laterality Date  . Gastric bypass  2008  . Appendectomy  ~ 1972  . Reduction mammaplasty Bilateral ~ 2010  . Kidney stone surgery  ~ 2004    "cut them out"  . Esophagogastroduodenoscopy (egd) with propofol N/A 11/28/2013    Procedure: ESOPHAGOGASTRODUODENOSCOPY (EGD) WITH PROPOFOL;  Surgeon: Inda Castle, MD;  Location: [facility];  Service: Endoscopy;  Laterality: N/A;      Medication List       This list is accurate as of: 02/03/14  5:35 PM.  Always use your most recent med list.               amiodarone 200 MG tablet  Commonly known as:  PACERONE  Take 200 mg by mouth daily as needed (for heart racing).       diclofenac 75 MG EC tablet  Commonly known as:  VOLTAREN  Take 1 tablet (75 mg total) by mouth 2 (two) times daily.     DULoxetine 30 MG capsule  Commonly known as:  CYMBALTA  Take 30 mg by mouth daily.     enalapril 10 MG tablet  Commonly known as:  VASOTEC  Take 10 mg by mouth daily.     ergocalciferol 50000 UNITS capsule  Commonly known as:  VITAMIN D2  Take 50,000 Units by mouth once a week. On Monday     ferrous sulfate 325 (65 FE) MG tablet  Take 325 mg by mouth daily with breakfast.     gabapentin 300 MG capsule  Commonly known as:  NEURONTIN  Take 1 capsule (300 mg total) by mouth 3 (three) times daily.     Hyoscyamine Sulfate 0.375 MG Tbcr  Take 1 tab bid prn abdominal pain     meclizine 25 MG tablet  Commonly known as:  ANTIVERT  Take 25 mg by mouth 3 (three) times daily as needed for dizziness.     metoprolol 50 MG tablet  Commonly known as:  LOPRESSOR  Take 1 tablet (50 mg total) by mouth 2 (two) times daily.  oxybutynin 5 MG 24 hr tablet  Commonly known as:  DITROPAN XL  Take 1 tablet (5 mg total) by mouth at bedtime.     pantoprazole 40 MG tablet  Commonly known as:  PROTONIX  Take 1 tablet (40 mg total) by mouth daily.     traMADol-acetaminophen 37.5-325 MG per tablet  Commonly known as:  ULTRACET  Take 1 tablet by mouth every 8 (eight) hours as needed for moderate pain.        Meds ordered this encounter  Medications  . diclofenac (VOLTAREN) 75 MG EC tablet    Sig: Take 1 tablet (75 mg total) by mouth 2 (two) times daily.    Dispense:  60 tablet    Refill:  3  . oxybutynin (DITROPAN-XL) 5 MG 24 hr tablet    Sig: Take 1 tablet (5 mg total) by mouth at bedtime.    Dispense:  30 tablet    Refill:  3    There is no immunization history for the selected administration types on file for this patient.  Family History  Problem Relation Age of Onset  . Diabetes Mother   . Heart disease Father     History  Substance Use Topics  .  Smoking status: Current Some Day Smoker -- 0.25 packs/day for 43 years    Types: Cigarettes  . Smokeless tobacco: Never Used  . Alcohol Use: 7.2 oz/week    12 Cans of beer per week    Review of Systems   As noted in HPI  Filed Vitals:   02/03/14 1704  BP: 147/89  Pulse: 76  Temp: 98 F (36.7 C)  Resp: 16    Physical Exam  Physical Exam  Constitutional: No distress.  Eyes: EOM are normal. Pupils are equal, round, and reactive to light.  Cardiovascular: Normal rate and regular rhythm.   Pulmonary/Chest: Breath sounds normal. No respiratory distress. She has no wheezes. She has no rales.  Abdominal: Soft. There is no tenderness. There is no rebound.  Musculoskeletal:  Bilateral knee tenderness + crepitation     CBC    Component Value Date/Time   WBC 7.3 11/05/2013 0047   RBC 4.15 11/05/2013 0047   HGB 13.2 11/05/2013 0047   HCT 39.6 11/05/2013 0047   PLT 188 11/05/2013 0047   MCV 95.4 11/05/2013 0047   LYMPHSABS 3.2 05/28/2013 1532   MONOABS 0.5 05/28/2013 1532   EOSABS 0.0 05/28/2013 1532   BASOSABS 0.0 05/28/2013 1532    CMP     Component Value Date/Time   NA 141 11/05/2013 0141   K 4.4 11/05/2013 0141   CL 107 11/05/2013 0141   CO2 25 11/05/2013 0141   GLUCOSE 89 11/05/2013 0141   BUN 10 11/05/2013 0141   CREATININE 0.64 11/05/2013 0141   CREATININE 0.65 05/28/2013 1532   CALCIUM 8.6 11/05/2013 0141   PROT 6.5 05/28/2013 1532   ALBUMIN 4.0 05/28/2013 1532   AST 17 05/28/2013 1532   ALT 11 05/28/2013 1532   ALKPHOS 77 05/28/2013 1532   BILITOT 0.4 05/28/2013 1532   GFRNONAA >90 11/05/2013 0141   GFRNONAA >89 05/28/2013 1532   GFRAA >90 11/05/2013 0141   GFRAA >89 05/28/2013 1532    Lab Results  Component Value Date/Time   CHOL 127 11/05/2013 01:41 AM    No components found for: HGA1C  Lab Results  Component Value Date/Time   AST 17 05/28/2013 03:32 PM    Assessment and Plan  Essential hypertension, benign Blood pressure is  borderline elevated, advise patient for DASH diet, continue current medications.will repeat blood chemistry on the next visit  Other urinary incontinence - Plan: Ambulatory referral to Urology,also trial of oxybutynin (DITROPAN-XL) 5 MG 24 hr tablet  Arthralgia of both knees - Plan: prescribed her diclofenac (VOLTAREN) 75 MG EC tablet, ordered DG Knee Bilateral Standing AP  Return in about 3 months (around 05/05/2014) for hypertension.  Lorayne Marek, MD

## 2014-02-03 NOTE — Progress Notes (Signed)
Patient here with interpreter Complains of bilateral knee pain Pain is keeping her up at night Also complains of having bladder incontinence for the past two months Will need to get a referral to urology

## 2014-03-24 ENCOUNTER — Other Ambulatory Visit: Payer: Self-pay

## 2014-03-24 ENCOUNTER — Telehealth: Payer: Self-pay

## 2014-03-24 DIAGNOSIS — K219 Gastro-esophageal reflux disease without esophagitis: Secondary | ICD-10-CM

## 2014-03-24 MED ORDER — DULOXETINE HCL 30 MG PO CPEP: 30.0000 mg | ORAL_CAPSULE | Freq: Every day | ORAL | Status: DC

## 2014-03-24 MED ORDER — METOPROLOL TARTRATE 50 MG PO TABS: 50.0000 mg | ORAL_TABLET | Freq: Two times a day (BID) | ORAL | Status: DC

## 2014-03-24 MED ORDER — TRAMADOL-ACETAMINOPHEN 37.5-325 MG PO TABS: 1.0000 | ORAL_TABLET | Freq: Three times a day (TID) | ORAL | Status: DC | PRN

## 2014-03-24 MED ORDER — GABAPENTIN 300 MG PO CAPS: 300.0000 mg | ORAL_CAPSULE | Freq: Three times a day (TID) | ORAL | Status: DC

## 2014-03-24 MED ORDER — PANTOPRAZOLE SODIUM 40 MG PO TBEC: 40.0000 mg | DELAYED_RELEASE_TABLET | Freq: Every day | ORAL | Status: DC

## 2014-03-24 NOTE — Telephone Encounter (Signed)
Patient calling requesting refills on all her medications prescribed by  Historical provider Can we refill all her medicaitons

## 2014-03-24 NOTE — Telephone Encounter (Signed)
Patient called again requesting refill on her medications [facility]

## 2014-03-24 NOTE — Telephone Encounter (Signed)
Please clarify with the patient which medication she needs refill on.

## 2014-03-28 ENCOUNTER — Telehealth: Payer: Self-pay | Admitting: *Deleted

## 2014-03-28 NOTE — Telephone Encounter (Signed)
Pt requesting Rx Antivert 25mg 

## 2014-03-30 ENCOUNTER — Emergency Department (HOSPITAL_COMMUNITY): Payer: Medicare Other

## 2014-03-30 ENCOUNTER — Encounter (HOSPITAL_COMMUNITY): Payer: Self-pay | Admitting: Physical Medicine and Rehabilitation

## 2014-03-30 ENCOUNTER — Emergency Department (HOSPITAL_COMMUNITY)
Admission: EM | Admit: 2014-03-30 | Discharge: 2014-03-30 | Disposition: A | Discharge status: Home / Self Care | Payer: Medicare Other | Attending: Emergency Medicine | Admitting: Emergency Medicine

## 2014-03-30 DIAGNOSIS — E119 Type 2 diabetes mellitus without complications: Secondary | ICD-10-CM | POA: Diagnosis not present

## 2014-03-30 DIAGNOSIS — R05 Cough: Secondary | ICD-10-CM | POA: Diagnosis not present

## 2014-03-30 DIAGNOSIS — M1990 Unspecified osteoarthritis, unspecified site: Secondary | ICD-10-CM | POA: Insufficient documentation

## 2014-03-30 DIAGNOSIS — M2550 Pain in unspecified joint: Secondary | ICD-10-CM

## 2014-03-30 DIAGNOSIS — I10 Essential (primary) hypertension: Secondary | ICD-10-CM | POA: Insufficient documentation

## 2014-03-30 DIAGNOSIS — Z79899 Other long term (current) drug therapy: Secondary | ICD-10-CM | POA: Insufficient documentation

## 2014-03-30 DIAGNOSIS — M25512 Pain in left shoulder: Secondary | ICD-10-CM | POA: Diagnosis not present

## 2014-03-30 DIAGNOSIS — R42 Dizziness and giddiness: Secondary | ICD-10-CM | POA: Diagnosis present

## 2014-03-30 DIAGNOSIS — Z87891 Personal history of nicotine dependence: Secondary | ICD-10-CM | POA: Diagnosis not present

## 2014-03-30 DIAGNOSIS — F329 Major depressive disorder, single episode, unspecified: Secondary | ICD-10-CM | POA: Insufficient documentation

## 2014-03-30 DIAGNOSIS — R0602 Shortness of breath: Secondary | ICD-10-CM | POA: Diagnosis not present

## 2014-03-30 DIAGNOSIS — G43909 Migraine, unspecified, not intractable, without status migrainosus: Secondary | ICD-10-CM | POA: Insufficient documentation

## 2014-03-30 DIAGNOSIS — G8929 Other chronic pain: Secondary | ICD-10-CM | POA: Insufficient documentation

## 2014-03-30 DIAGNOSIS — Z8619 Personal history of other infectious and parasitic diseases: Secondary | ICD-10-CM | POA: Insufficient documentation

## 2014-03-30 DIAGNOSIS — R0789 Other chest pain: Secondary | ICD-10-CM | POA: Diagnosis not present

## 2014-03-30 DIAGNOSIS — M2662 Arthralgia of temporomandibular joint: Secondary | ICD-10-CM | POA: Diagnosis not present

## 2014-03-30 DIAGNOSIS — Z862 Personal history of diseases of the blood and blood-forming organs and certain disorders involving the immune mechanism: Secondary | ICD-10-CM | POA: Insufficient documentation

## 2014-03-30 DIAGNOSIS — K219 Gastro-esophageal reflux disease without esophagitis: Secondary | ICD-10-CM | POA: Insufficient documentation

## 2014-03-30 DIAGNOSIS — R110 Nausea: Secondary | ICD-10-CM | POA: Insufficient documentation

## 2014-03-30 DIAGNOSIS — Z87442 Personal history of urinary calculi: Secondary | ICD-10-CM | POA: Insufficient documentation

## 2014-03-30 DIAGNOSIS — M542 Cervicalgia: Secondary | ICD-10-CM | POA: Insufficient documentation

## 2014-03-30 LAB — CBC WITH DIFFERENTIAL/PLATELET
Basophils Absolute: 0 10*3/uL (ref 0.0–0.1)
Basophils Relative: 0 % (ref 0–1)
Eosinophils Absolute: 0.1 10*3/uL (ref 0.0–0.7)
Eosinophils Relative: 1 % (ref 0–5)
HCT: 41.4 % (ref 36.0–46.0)
Hemoglobin: 13.7 g/dL (ref 12.0–15.0)
Lymphocytes Relative: 35 % (ref 12–46)
Lymphs Abs: 3 10*3/uL (ref 0.7–4.0)
MCH: 32.3 pg (ref 26.0–34.0)
MCHC: 33.1 g/dL (ref 30.0–36.0)
MCV: 97.6 fL (ref 78.0–100.0)
MONO ABS: 0.6 10*3/uL (ref 0.1–1.0)
Monocytes Relative: 7 % (ref 3–12)
NEUTROS PCT: 57 % (ref 43–77)
Neutro Abs: 5 10*3/uL (ref 1.7–7.7)
PLATELETS: 270 10*3/uL (ref 150–400)
RBC: 4.24 MIL/uL (ref 3.87–5.11)
RDW: 12 % (ref 11.5–15.5)
WBC: 8.7 10*3/uL (ref 4.0–10.5)

## 2014-03-30 LAB — I-STAT TROPONIN, ED
TROPONIN I, POC: 0 ng/mL (ref 0.00–0.08)
Troponin i, poc: 0 ng/mL (ref 0.00–0.08)

## 2014-03-30 LAB — COMPREHENSIVE METABOLIC PANEL
ALT: 17 U/L (ref 0–35)
ANION GAP: 5 (ref 5–15)
AST: 23 U/L (ref 0–37)
Albumin: 3.4 g/dL — ABNORMAL LOW (ref 3.5–5.2)
Alkaline Phosphatase: 82 U/L (ref 39–117)
BUN: 8 mg/dL (ref 6–23)
CALCIUM: 9 mg/dL (ref 8.4–10.5)
CO2: 23 mmol/L (ref 19–32)
CREATININE: 0.63 mg/dL (ref 0.50–1.10)
Chloride: 114 mmol/L — ABNORMAL HIGH (ref 96–112)
GFR calc Af Amer: >90 mL/min (ref >90)
GLUCOSE: 97 mg/dL (ref 70–99)
POTASSIUM: 4.9 mmol/L (ref 3.5–5.1)
SODIUM: 142 mmol/L (ref 135–145)
TOTAL PROTEIN: 6.3 g/dL (ref 6.0–8.3)
Total Bilirubin: 0.5 mg/dL (ref 0.3–1.2)

## 2014-03-30 LAB — LIPASE, BLOOD: LIPASE: 39 U/L (ref 11–59)

## 2014-03-30 NOTE — ED Notes (Signed)
Pt presents to department for evaluation of diffuse chest pain radiating down L arm. Also states nausea and dizziness. Ongoing x3 days. 3/10 pain upon arrival to ED, increases with movement. Pt is alert and oriented x4. Does not speak english, interpreter at bedside.

## 2014-03-30 NOTE — ED Notes (Signed)
Pt discharged. Discharge instructions given in [place], pt understood. Pt refused wheelchair on discharge. Pt discharged home with family.

## 2014-03-30 NOTE — ED Notes (Signed)
Pt here for evaluation of chest pain central radiating to left arm and neck for three days. Pt saw a cardiologist and gastroenterologist for nausea and chest pain previously and the gastro told her it was not related to her heart. Pain to neck is tender upon palpation to left side.

## 2014-03-30 NOTE — Discharge Instructions (Signed)
Dolor de pecho (no especfico) (Chest Pain (Nonspecific)) Suele ser difcil diagnosticar la causa del dolor de [place]. Siempre hay una posibilidad de que el dolor podra estar relacionado con algo grave, como un ataque al corazn o un cogulo sanguneo en los pulmones. Debe concurrir a las visitas de control con el mdico. CUIDADOS EN EL HOGAR  Si le dieron antibiticos, tmelos como se lo haya indicado el mdico. Finalice el medicamento, aunque comience a Sports administrator.  [address] no haga actividades que provoquen dolor de [place]. Contine con las actividades fsicas como se lo haya indicado el mdico.  No use productos que contengan tabaco, que incluyen cigarrillos, tabaco para Higher education careers adviser y Psychologist, sport and exercise.  Evite el consumo de alcohol.  Tome los medicamentos solamente como se lo haya indicado el mdico.  Siga las sugerencias del mdico en lo que respecta a ms pruebas, si el dolor de pecho no desaparece.  Concurra a todas las visitas que concert con el mdico. SOLICITE AYUDA SI:  El dolor de pecho no desaparece, incluso despus del tratamiento.  Tiene una erupcin cutnea con ampollas en el pecho.  Tiene fiebre. SOLICITE AYUDA DE INMEDIATO SI:   Aumenta el dolor de pecho o el dolor se irradia hacia el brazo, el cuello, la [place], la espalda o el vientre (abdomen).  Le falta el aire.  Tose ms de lo normal o tose con sangre.  Siente un dolor muy intenso en la espalda o el vientre.  Tiene malestar estomacal (nuseas) o vomita.  Se siente muy dbil.  Pierde el conocimiento (se desmaya).  Tiene escalofros. Esto es Engineer, maintenance (IT). No espere a ver que los problemas desaparezcan. Llame a los servicios de emergencia locales (911 en [place]). No conduzca por sus propios medios [employer] hospital. ASEGRESE DE QUE:   Comprende estas instrucciones.  Controlar su afeccin.  Recibir ayuda de inmediato si no mejora o si empeora. Document  Released: 05/13/2008 Document Revised: 02/19/2013 [facility] Patient Information 2015 [place]. This information is not intended to replace advice given to you by your health care provider. Make sure you discuss any questions you have with your health care provider.  Artritis inespecfica (Arthritis, Nonspecific) La artritis es la inflamacin de una articulacin. Los sntomas son dolor, enrojecimiento, calor o hinchazn. Pueden verse involucradas una o ms articulaciones. Hay diferentes tipos de artritis. El mdico no podr Lobbyist cul es el tipo de artritis que usted sufre.  CAUSAS La causa ms frecuente es el desgaste de la articulacin (osteoartritis). Esto ocasiona lesiones en el cartlago, que puede romperse con [place]. Las zonas ms afectadas por este tipo de artritis son las rodillas, caderas, espalda y cuello. Otros tipos de artritis y causas frecuentes de dolor en la articulacin son:  Esguinces y otras lesiones cercanas a la articulacin}. En algunos casos, esguinces y lesiones menores causan dolor e hinchazn que aparece horas ms tarde.  Artritis reumatoidea [employer] Corporation, pies y rodillas. Generalmente afecta ambos lados del cuerpo al [employer]. Generalmente se asocia a enfermedades crnicas, fiebre, prdida de peso y debilidad general.  Artritis por cristales. La gota y la pseudogota pueden causar dolor intenso agudo ocasional, enrojecimiento e hinchazn del pie, el tobillo o la rodilla.  Artritis infecciosa. Las bacterias pueden penetrar en la articulacin a travs de una herida en la piel. Esto puede causar una infeccin en la articulacin. Las bacterias y virus tambin pueden diseminarse a travs del torrente sanguneo y Print production planner las articulaciones.  Reacciones a  medicamentos, infecciosas y Camera operator. En algunos casos las articulaciones duelen levemente y estn ligeramente hinchadas en este tipo de enfermedad. SNTOMAS  El dolor es el sntoma  principal.  La articulacin tambin pueden verse roja, hinchada y caliente al tacto.  En ciertos tipos de artritis hay fiebre o malestar general.  En la articulacin que presenta artritis sentir dolor con el movimiento. En otros tipos de artritis hay rigidez. DIAGNSTICO: El mdico sospechar artritis basndose en la descripcin de los sntomas y en el examen. Ser necesario realizar pruebas para diagnosticar el tipo de artritis.  Anlisis de [place] y en algunos casos de [place].  Radiografas y en algunos casos tomografa computada o diagnstico por imgenes.  La remocin del lquido de Water engineer (artrocentesis) se realiza para Aeronautical engineer presencia de bacterias, cristales o por otras causas. Su mdico (o un especialista) adormecern la zona de la articulacin con un anestsico local y utilizarn una aguja para retirar lquido de la articulacin para ser examinado. Este procedimiento es slo mnimamente molesto.  An con estas pruebas, el mdico no podr decir qu tipo de artritis usted sufre. La consulta con un especialista (reumatlogo) puede ser de [place]. TRATAMIENTO El mdico comentar con usted el tratamiento especfico para su tipo de artritis. Si el tipo especfico no puede determinarse, podrn aplicarse las siguientes recomendaciones generales.  El tratamiento para el dolor intenso de las articulaciones consiste en:  Hacer reposo  Elevar el [place].  Podrn prescribirle medicamentos antiinflamatorios (como ibuprofeno). Evite las actividades que aumenten Conservation officer, historic buildings.  Slo tome medicamentos de venta libre o prescriptos para Glass blower/designer y las [place], segn las indicaciones de su mdico.  Puede aplicarse compresas fras sobre la articulacin dolorida durante 10 a 15 minutos cada hora. Las compresas calientes tambin pueden ser beneficiosas, pero no las utilice durante la noche. No use compresas calientes sin autorizacin de su mdico, si es diabtico.  Una inyeccin  de corticoides en la articulacin artrtica puede ayudar a reducir el dolor y la hinchazn.  Si una artritis aguda [employer] siguientes 1  2 [place], ser necesario descartar una infeccin. El tratamiento prolongado implica la modificacin de [place] y del estilo de vida para reducir Pension scheme manager. Puede ser necesario que baje de [place]. La actividad fsica es necesaria para nutrir Charity fundraiser de la articulacin y Tax adviser. Esto ayuda a Theatre manager fuertes los msculos que rodean Water engineer. INSTRUCCIONES PARA EL CUIDADO DOMICILIARIO  No tome aspirina para Best boy si se sospecha que sufre gota. Esto eleva los niveles de cido rico.  Solo tome medicamentos que se pueden comprar sin receta o recetados para Conservation officer, historic buildings, Tree surgeon o fiebre, como le indica el mdico.  [place] reposo todo el tiempo que pueda.  Si la articulacin est hinchada, mantngala elevada.  Utilice muletas si la articulacin que le duele est en la pierna.  Beber abundante cantidad de lquidos ser beneficioso para ciertos tipos de artritis.  [place] fsica regular puede ser beneficiosa, incluyendo las actividades de bajo impacto como:  [place].  [place].  Andar en bicicleta.  Caminar.  La rigidez matutina se Charleen Kirks con una ducha caliente.  Tambin es beneficioso que realice ejercicios de amplitud de movimiento. SOLICITE ATENCIN MDICA SI:  No se siente mejor o empeora luego de las 24 horas.  Presenta efectos adversos por los medicamentos y no mejora con Dispensing optician. SOLICITE ATENCIN MDICA INMEDIATAMENTE SI:  Tiene fiebre.  Presenta fiebre o  dolor intenso, hinchazn o enrojecimiento.  Muchas articulaciones estn involucradas y estn hinchadas y Tree surgeon.  Tiene un dolor intenso en la espalda o siente debilidad en las piernas.  Pierde el control de la vejiga o del intestino. Document Released: 02/14/2005  Document Revised: 05/09/2011 [facility] Patient Information 2015 [place]. This information is not intended to replace advice given to you by your health care provider. Make sure you discuss any questions you have with your health care provider.

## 2014-03-30 NOTE — ED Provider Notes (Signed)
CSN: [id number]     Arrival date & time 03/30/14  1448 History   First MD Initiated Contact with Patient 03/30/14 1614     Chief Complaint  Patient presents with  . Dizziness  . Chest Pain     (Consider location/radiation/quality/duration/timing/severity/associated sxs/prior Treatment) HPI   59 year old female past medical history of chronic pain syndrome, PSVT, GERD, diabetes, hypertension, who presents to ED complaining of worsening of her chronic left shoulder pain which radiates into her left neck as well as mild epigastric/substernal turning chest pain which is been ongoing for the last 1 day. Patient reports this chest pain is something she has had many times in the past and her GI doctor states that it is likely related to costochondritis. Patient states she has not recently injured her left shoulder but notes that with movement her pain is made much worse. She does take Percocet for this which helps temporarily but then pain returns. Patient also reports having mild shortness of breath and nausea. She denies any vomiting, leg swelling or calf pain. She states she has chronic low back pain and chronic knee pain but that's unchanged today. Pain is currently rated as moderate to severe in the shoulder and mild in the chest. She describes pain as an ache. No other complaints at this time.   Past Medical History  Diagnosis Date  . Hypertension   . Kidney stones   . Depression   . Tachycardia   . Angiomyolipoma of right kidney   . GERD (gastroesophageal reflux disease)   . Type II diabetes mellitus     "took Metformin"; resolved S/P gastric bypass  . Anemia   . Hepatitis     "when I was pregnant; got the one that's not bad"   . Migraine     "q few months" (11/04/2013)  . Arthritis     "hands, feet, back, neck" (11/04/2013)  . DDD (degenerative disc disease)   . Chronic back pain     "neck down into lower back" (11/04/2013)   Past Surgical History  Procedure Laterality Date  .  Gastric bypass  2008  . Appendectomy  ~ 1972  . Reduction mammaplasty Bilateral ~ 2010  . Kidney stone surgery  ~ 2004    "cut them out"  . Esophagogastroduodenoscopy (egd) with propofol N/A 11/28/2013    Procedure: ESOPHAGOGASTRODUODENOSCOPY (EGD) WITH PROPOFOL;  Surgeon: Inda Castle, MD;  Location: [facility];  Service: Endoscopy;  Laterality: N/A;   Family History  Problem Relation Age of Onset  . Diabetes Mother   . Heart disease Father    History  Substance Use Topics  . Smoking status: Former Smoker -- 0.25 packs/day for 43 years  . Smokeless tobacco: Never Used  . Alcohol Use: No   OB History    Gravida Para Term Preterm AB TAB SAB Ectopic Multiple Living   5 3 3  2 2    3      Review of Systems  Constitutional: Negative for fever and chills.  HENT: Negative for congestion, rhinorrhea and sore throat.   Eyes: Negative for visual disturbance.  Respiratory: Positive for cough and shortness of breath.   Cardiovascular: Positive for chest pain. Negative for palpitations and leg swelling.  Gastrointestinal: Positive for nausea. Negative for vomiting, abdominal pain, diarrhea and constipation.  Genitourinary: Negative for dysuria, hematuria, vaginal bleeding and vaginal discharge.  Musculoskeletal: Positive for arthralgias (L shoulder) and neck pain. Negative for back pain.  Skin: Negative for rash.  Neurological: Negative for weakness and headaches.  All other systems reviewed and are negative.     Allergies  Review of patient's allergies indicates no known allergies.  Home Medications   Prior to Admission medications   Medication Sig Start Date End Date Taking? Authorizing Provider  amiodarone (PACERONE) 200 MG tablet Take 200 mg by mouth daily as needed (for heart racing).    Historical Provider, MD  diclofenac (VOLTAREN) 75 MG EC tablet Take 1 tablet (75 mg total) by mouth 2 (two) times daily. 02/03/14   Lorayne Marek, MD  DULoxetine (CYMBALTA) 30 MG capsule  Take 1 capsule (30 mg total) by mouth daily. 03/24/14   Lorayne Marek, MD  enalapril (VASOTEC) 10 MG tablet Take 10 mg by mouth daily.    Historical Provider, MD  ergocalciferol (VITAMIN D2) 50000 UNITS capsule Take 50,000 Units by mouth once a week. On Monday    Historical Provider, MD  ferrous sulfate 325 (65 FE) MG tablet Take 325 mg by mouth daily with breakfast.    Historical Provider, MD  gabapentin (NEURONTIN) 300 MG capsule Take 1 capsule (300 mg total) by mouth 3 (three) times daily. 03/24/14   Lorayne Marek, MD  Hyoscyamine Sulfate 0.375 MG TBCR Take 1 tab bid prn abdominal pain 11/28/13   Inda Castle, MD  meclizine (ANTIVERT) 25 MG tablet Take 25 mg by mouth 3 (three) times daily as needed for dizziness.    Historical Provider, MD  metoprolol (LOPRESSOR) 50 MG tablet Take 1 tablet (50 mg total) by mouth 2 (two) times daily. 03/24/14   Lorayne Marek, MD  oxybutynin (DITROPAN-XL) 5 MG 24 hr tablet Take 1 tablet (5 mg total) by mouth at bedtime. 02/03/14   Lorayne Marek, MD  pantoprazole (PROTONIX) 40 MG tablet Take 1 tablet (40 mg total) by mouth daily. 03/24/14   Lorayne Marek, MD  traMADol-acetaminophen (ULTRACET) 37.5-325 MG per tablet Take 1 tablet by mouth every 8 (eight) hours as needed for moderate pain. 03/24/14   Lorayne Marek, MD   BP 125/84 mmHg  Pulse 76  Temp(Src) 98.1 F (36.7 C) (Oral)  Resp 18  SpO2 98% Physical Exam  Constitutional: She is oriented to person, place, and time. She appears well-developed and well-nourished. No distress.  HENT:  Head: Normocephalic and atraumatic.  Eyes: Conjunctivae are normal.  Neck: Normal range of motion.  Cardiovascular: Normal rate, regular rhythm, normal heart sounds and intact distal pulses.   No murmur heard. Pulmonary/Chest: Effort normal and breath sounds normal. No respiratory distress. She has no wheezes. She has no rales. She exhibits tenderness (midsternal).  Abdominal: Soft. Bowel sounds are normal. She exhibits no  distension and no mass. There is no tenderness. There is no rebound and no guarding.  Musculoskeletal:       Left shoulder: She exhibits decreased range of motion, tenderness and bony tenderness. She exhibits no swelling, no deformity and no spasm.  Easily reproducible TTP in shoulder, scapula, trap muscles.  Neurological: She is alert and oriented to person, place, and time. No cranial nerve deficit.  Skin: Skin is warm and dry.  Psychiatric: She has a normal mood and affect.  Nursing note and vitals reviewed.   ED Course  Procedures (including critical care time) Labs Review Labs Reviewed  CBC WITH DIFFERENTIAL/PLATELET  COMPREHENSIVE METABOLIC PANEL  LIPASE, BLOOD  I-STAT TROPOININ, ED    Imaging Review Dg Chest 2 View  03/30/2014   CLINICAL DATA:  Dizziness. Chest pain. Chest pain radiating down the LEFT  arm. Nausea and dizziness for 3 days.  EXAM: CHEST  2 VIEW  COMPARISON:  None.  FINDINGS: Cardiopericardial silhouette within normal limits. Mediastinal contours normal. Trachea midline. No airspace disease or effusion.  IMPRESSION: No active cardiopulmonary disease.   Electronically Signed   By: Dereck Ligas M.D.   On: 03/30/2014 15:39     EKG Interpretation   Date/Time:  Sunday March 30 2014 14:54:12 EST Ventricular Rate:  68 PR Interval:  152 QRS Duration: 92 QT Interval:  394 QTC Calculation: 418 R Axis:   1 Text Interpretation:  Normal sinus rhythm Incomplete right bundle branch  block Borderline ECG No significant change was found Confirmed by Wyvonnia Dusky   MD, Shlomo Seres [id number]) on 03/30/2014 4:14:42 PM      MDM   Final diagnoses:  None   DHRUTI GHUMAN is a 59 y.o. female who p/w L shoulder pain radiating to neck. This is a chronic pain which is worse today. Also, having mild CP/epigastric pain which GI has seen and she has neg recent EGD and neg stress within last 6 months. ABCs intact. HDS, NAD.   EKG personally review by myself and showed: NSR Heart  score low.  Labs notable for neg Tn. Pain is reproducible raising c/f MSK related CP. Pain is associated with meals and lying down raising c/f GI cause, GERD.  Pain does not radiate to back, blood pressure is stable. No mediastinal widening on CXR. Dissection unlikely. Pt's breath sounds are equal bilaterally. No PTX on CXR. PTX unlikely.  Pt has Wells score of 0; PE unlikely. Pain is not positional and EKG is WNL. Pericarditis unlikely. No h/o cocaine use. Pt reports no cough or fever. No infiltrate on CXR. PNA unlikely. No Hx of exertion, trauma.   L shoulder pain appears MSK - pt with known chronic pain syndrome and L shoulder pain. Worse today. No recent trauma. Takes percocets which help. Pain not radiating from chest to shoulder. Do not feel the 2 are related.  Delta Tn neg.  Clinical Impression: 1. Left shoulder pain   2. Arthralgia   3. Musculoskeletal chest pain     Disposition: Discharge  Condition: Good  I have discussed the results, Dx and Tx plan with the pt(& family if present). He/she/they expressed understanding and agree(s) with the plan. Discharge instructions discussed at great length. Strict return precautions discussed and pt &/or family have verbalized understanding of the instructions. No further questions at time of discharge.    New Prescriptions   No medications on file    Follow Up: Lorayne Marek, MD [place] [phone]  Schedule an appointment as soon as possible for a visit in 1 week   [place] [address] [id number] [place] [phone]  If symptoms worsen   Pt seen in conjunction with Dr. Ezequiel Essex, MD  Kirstie Peri, [facility] Resident - PGY-2      Kirstie Peri, MD 03/30/14 [place], MD 03/31/14 [phone]

## 2014-03-31 MED ORDER — MECLIZINE HCL 25 MG PO TABS: 25.0000 mg | ORAL_TABLET | Freq: Three times a day (TID) | ORAL | Status: DC | PRN

## 2014-03-31 NOTE — Addendum Note (Signed)
Addended by: Betti Cruz on: 03/31/2014 11:49 AM   Modules accepted: Orders, Medications

## 2014-03-31 NOTE — Telephone Encounter (Signed)
Patient can be given refill on the medication. 

## 2014-03-31 NOTE — Telephone Encounter (Signed)
Rx send

## 2014-05-14 ENCOUNTER — Other Ambulatory Visit: Payer: Self-pay | Admitting: Internal Medicine

## 2014-06-10 ENCOUNTER — Other Ambulatory Visit: Payer: Self-pay | Admitting: Internal Medicine

## 2014-06-18 ENCOUNTER — Encounter: Payer: Self-pay | Admitting: Internal Medicine

## 2014-06-18 ENCOUNTER — Ambulatory Visit
Admission: RE | Admit: 2014-06-18 | Discharge: 2014-06-18 | Disposition: A | Discharge status: Home / Self Care | Payer: Medicare Other | Source: Ambulatory Visit | Attending: Internal Medicine | Admitting: Internal Medicine

## 2014-06-18 ENCOUNTER — Ambulatory Visit (HOSPITAL_BASED_OUTPATIENT_CLINIC_OR_DEPARTMENT_OTHER): Payer: Medicare Other | Admitting: Internal Medicine

## 2014-06-18 ENCOUNTER — Telehealth: Payer: Self-pay

## 2014-06-18 VITALS — BP 130/80 | HR 78 | Temp 98.0°F | Resp 16 | Wt 228.8 lb

## 2014-06-18 DIAGNOSIS — Z87891 Personal history of nicotine dependence: Secondary | ICD-10-CM | POA: Diagnosis not present

## 2014-06-18 DIAGNOSIS — K219 Gastro-esophageal reflux disease without esophagitis: Secondary | ICD-10-CM | POA: Diagnosis not present

## 2014-06-18 DIAGNOSIS — H8110 Benign paroxysmal vertigo, unspecified ear: Secondary | ICD-10-CM

## 2014-06-18 DIAGNOSIS — M25561 Pain in right knee: Secondary | ICD-10-CM | POA: Diagnosis not present

## 2014-06-18 DIAGNOSIS — R0981 Nasal congestion: Secondary | ICD-10-CM

## 2014-06-18 DIAGNOSIS — M2550 Pain in unspecified joint: Secondary | ICD-10-CM

## 2014-06-18 DIAGNOSIS — I10 Essential (primary) hypertension: Secondary | ICD-10-CM | POA: Diagnosis not present

## 2014-06-18 DIAGNOSIS — M25562 Pain in left knee: Secondary | ICD-10-CM | POA: Diagnosis not present

## 2014-06-18 DIAGNOSIS — G894 Chronic pain syndrome: Secondary | ICD-10-CM

## 2014-06-18 DIAGNOSIS — N39498 Other specified urinary incontinence: Secondary | ICD-10-CM

## 2014-06-18 LAB — RHEUMATOID FACTOR: Rhuematoid fact SerPl-aCnc: <10 IU/mL (ref <=14)

## 2014-06-18 LAB — URIC ACID: URIC ACID, SERUM: 3.9 mg/dL (ref 2.4–7.0)

## 2014-06-18 MED ORDER — METOPROLOL TARTRATE 50 MG PO TABS: 50.0000 mg | ORAL_TABLET | Freq: Two times a day (BID) | ORAL | Status: DC

## 2014-06-18 MED ORDER — MECLIZINE HCL 25 MG PO TABS
25.0000 mg | ORAL_TABLET | Freq: Three times a day (TID) | ORAL | Status: DC | PRN
Start: 2014-06-18 — End: 2014-08-20

## 2014-06-18 MED ORDER — FLUTICASONE PROPIONATE 50 MCG/ACT NA SUSP: 2.0000 | Freq: Every day | NASAL | Status: DC

## 2014-06-18 MED ORDER — TRAMADOL-ACETAMINOPHEN 37.5-325 MG PO TABS: 1.0000 | ORAL_TABLET | Freq: Three times a day (TID) | ORAL | Status: DC | PRN

## 2014-06-18 MED ORDER — PANTOPRAZOLE SODIUM 40 MG PO TBEC: 40.0000 mg | DELAYED_RELEASE_TABLET | Freq: Every day | ORAL | Status: DC

## 2014-06-18 NOTE — Progress Notes (Signed)
MRN: [id number] Name: Shannon Andrade  Sex: female Age: 59 y.o. DOB: May 03, 1955  Allergies: Review of patient's allergies indicates no known allergies.  Chief Complaint  Patient presents with  . Follow-up    HPI: Patient is 59 y.o. female who history of hypertension, urinary incontinence, chronic pain syndrome, chronic bilateral knee pain/arthralgia, chronic vertigo, as per patient she used to see an ENT specialist in the past and is requesting another referral, she also has been referred to pain management in the past at bedtime patient did not had insurance, today she has not taken her blood pressure medication, her blood pressure today is elevated, she was given clonidine, repeat manual blood pressure is improved. Patient is also requesting refill on her pain medication.patient reports family history of arthritis, not sure if anyone was diagnosed with rheumatoid arthritis, she does complain of multiple joint pain and morning stiffness in her hand joints.  Past Medical History  Diagnosis Date  . Hypertension   . Kidney stones   . Depression   . Tachycardia   . Angiomyolipoma of right kidney   . GERD (gastroesophageal reflux disease)   . Type II diabetes mellitus     "took Metformin"; resolved S/P gastric bypass  . Anemia   . Hepatitis     "when I was pregnant; got the one that's not bad"   . Migraine     "q few months" (11/04/2013)  . Arthritis     "hands, feet, back, neck" (11/04/2013)  . DDD (degenerative disc disease)   . Chronic back pain     "neck down into lower back" (11/04/2013)    Past Surgical History  Procedure Laterality Date  . Gastric bypass  2008  . Appendectomy  ~ 1972  . Reduction mammaplasty Bilateral ~ 2010  . Kidney stone surgery  ~ 2004    "cut them out"  . Esophagogastroduodenoscopy (egd) with propofol N/A 11/28/2013    Procedure: ESOPHAGOGASTRODUODENOSCOPY (EGD) WITH PROPOFOL;  Surgeon: Inda Castle, MD;  Location: [facility];  Service:  Endoscopy;  Laterality: N/A;      Medication List       This list is accurate as of: 06/18/14  1:16 PM.  Always use your most recent med list.               amiodarone 200 MG tablet  Commonly known as:  PACERONE  Take 200 mg by mouth daily as needed (for heart racing).     diclofenac 75 MG EC tablet  Commonly known as:  VOLTAREN  Take 1 tablet (75 mg total) by mouth 2 (two) times daily.     enalapril 10 MG tablet  Commonly known as:  VASOTEC  Take 10 mg by mouth daily.     ergocalciferol 50000 UNITS capsule  Commonly known as:  VITAMIN D2  Take 50,000 Units by mouth once a week. On Monday     ferrous sulfate 325 (65 FE) MG tablet  Take 325 mg by mouth daily with breakfast.     fluticasone 50 MCG/ACT nasal spray  Commonly known as:  FLONASE  Place 2 sprays into both nostrils daily.     gabapentin 300 MG capsule  Commonly known as:  NEURONTIN  Take 1 capsule (300 mg total) by mouth 3 (three) times daily.     Hyoscyamine Sulfate 0.375 MG Tbcr  Take 1 tab bid prn abdominal pain     meclizine 25 MG tablet  Commonly known as:  ANTIVERT  Take  1 tablet (25 mg total) by mouth 3 (three) times daily as needed for dizziness.     metoprolol 50 MG tablet  Commonly known as:  LOPRESSOR  Take 1 tablet (50 mg total) by mouth 2 (two) times daily.     oxybutynin 5 MG 24 hr tablet  Commonly known as:  DITROPAN XL  Take 1 tablet (5 mg total) by mouth at bedtime.     oxymetazoline 0.05 % nasal spray  Commonly known as:  AFRIN  Place 1 spray into both nostrils 2 (two) times daily.     pantoprazole 40 MG tablet  Commonly known as:  PROTONIX  Take 1 tablet (40 mg total) by mouth daily.     PRESCRIPTION MEDICATION  Place 1 spray into alternate nostrils daily as needed. For nasal congestion     traMADol-acetaminophen 37.5-325 MG per tablet  Commonly known as:  ULTRACET  Take 1 tablet by mouth every 8 (eight) hours as needed for moderate pain.        Meds ordered this  encounter  Medications  . meclizine (ANTIVERT) 25 MG tablet    Sig: Take 1 tablet (25 mg total) by mouth 3 (three) times daily as needed for dizziness.    Dispense:  30 tablet    Refill:  2  . metoprolol (LOPRESSOR) 50 MG tablet    Sig: Take 1 tablet (50 mg total) by mouth 2 (two) times daily.    Dispense:  60 tablet    Refill:  2  . pantoprazole (PROTONIX) 40 MG tablet    Sig: Take 1 tablet (40 mg total) by mouth daily.    Dispense:  30 tablet    Refill:  3  . traMADol-acetaminophen (ULTRACET) 37.5-325 MG per tablet    Sig: Take 1 tablet by mouth every 8 (eight) hours as needed for moderate pain.    Dispense:  30 tablet    Refill:  0  . fluticasone (FLONASE) 50 MCG/ACT nasal spray    Sig: Place 2 sprays into both nostrils daily.    Dispense:  16 g    Refill:  3    There is no immunization history for the selected administration types on file for this patient.  Family History  Problem Relation Age of Onset  . Diabetes Mother   . Heart disease Father     History  Substance Use Topics  . Smoking status: Former Smoker -- 0.25 packs/day for 43 years  . Smokeless tobacco: Never Used  . Alcohol Use: No    Review of Systems   As noted in HPI  Filed Vitals:   06/18/14 1253  BP: 130/80  Pulse:   Temp:   Resp:     Physical Exam  Physical Exam  HENT:  Joseph congestion no sinus tenderness Both TMs visualized not congested  Eyes: EOM are normal. Pupils are equal, round, and reactive to light.  Cardiovascular: Normal rate and regular rhythm.   Pulmonary/Chest: No respiratory distress. She has no wheezes. She has no rales.  Abdominal: Soft. There is no tenderness. There is no rebound.  Musculoskeletal:  Bilateral knee crepitation+    CBC    Component Value Date/Time   WBC 8.7 03/30/2014 1635   RBC 4.24 03/30/2014 1635   HGB 13.7 03/30/2014 1635   HCT 41.4 03/30/2014 1635   PLT 270 03/30/2014 1635   MCV 97.6 03/30/2014 1635   LYMPHSABS 3.0 03/30/2014 1635    MONOABS 0.6 03/30/2014 1635   EOSABS 0.1 03/30/2014 1635  BASOSABS 0.0 03/30/2014 1635    CMP     Component Value Date/Time   NA 142 03/30/2014 1635   K 4.9 03/30/2014 1635   CL 114* 03/30/2014 1635   CO2 23 03/30/2014 1635   GLUCOSE 97 03/30/2014 1635   BUN 8 03/30/2014 1635   CREATININE 0.63 03/30/2014 1635   CREATININE 0.65 05/28/2013 1532   CALCIUM 9.0 03/30/2014 1635   PROT 6.3 03/30/2014 1635   ALBUMIN 3.4* 03/30/2014 1635   AST 23 03/30/2014 1635   ALT 17 03/30/2014 1635   ALKPHOS 82 03/30/2014 1635   BILITOT 0.5 03/30/2014 1635   GFRNONAA >90 03/30/2014 1635   GFRNONAA >89 05/28/2013 1532   GFRAA >90 03/30/2014 1635   GFRAA >89 05/28/2013 1532    Lab Results  Component Value Date/Time   CHOL 127 11/05/2013 01:41 AM    Lab Results  Component Value Date/Time   HGBA1C 5.3 05/28/2013 04:14 PM    Lab Results  Component Value Date/Time   AST 23 03/30/2014 04:35 PM    Assessment and Plan  Gastroesophageal reflux disease, esophagitis presence not specified - Plan: lifestyle modification, continue with pantoprazole (PROTONIX) 40 MG tablet  Essential hypertension, benign - Plan: advised patient for DASH diet, continue with metoprolol (LOPRESSOR) 50 MG tablet  Arthralgia of both knees - Plan: traMADol-acetaminophen (ULTRACET) 37.5-325 MG per tablet, DG Knee Bilateral Standing AP  BPPV (benign paroxysmal positional vertigo), unspecified laterality - Plan: meclizine (ANTIVERT) 25 MG tablet, Ambulatory referral to ENT  Nasal congestion - Plan: fluticasone (FLONASE) 50 MCG/ACT nasal spray  Other urinary incontinence - Plan: Ambulatory referral to Urology  Multiple joint pain - Plan: traMADol-acetaminophen (ULTRACET) 37.5-325 MG per tablet, Rheumatoid factor, ANA, Uric acid, Cyclic citrul peptide antibody, IgG  Chronic pain syndrome - Plan: traMADol-acetaminophen (ULTRACET) 37.5-325 MG per tablet, Ambulatory referral to Pain Clinic   Return in about 3 months  (around 09/17/2014), or if symptoms worsen or fail to improve.   This note has been created with Surveyor, quantity. Any transcriptional errors are unintentional.    Lorayne Marek, MD

## 2014-06-18 NOTE — Progress Notes (Signed)
Patient here with in house interpreter0Belen Patient complains of leg pain knee pain and back pain Patient is requesting referral to ortho and pain management Patient also states she suffers from vertigo and requesting a referral to ENT Patient is also requesting a full time nurses aid because she states she need full time Help at home due to her vertigo Patient's blood pressure is elevated but patient states she has not taken her medication yet today

## 2014-06-18 NOTE — Telephone Encounter (Signed)
-----   Message from Lorayne Marek, MD sent at 06/18/2014  3:53 PM EDT ----- Call and let the patient know that her x-ray of both knees is reported to be normal,  negative for DJD

## 2014-06-18 NOTE — Telephone Encounter (Signed)
Interpreter used _Belen Patient not available Message left on voice mail to return our call

## 2014-06-19 LAB — ANA: Anti Nuclear Antibody(ANA): NEGATIVE

## 2014-06-20 LAB — CYCLIC CITRUL PEPTIDE ANTIBODY, IGG: Cyclic Citrullin Peptide Ab: <2 U/mL (ref 0.0–5.0)

## 2014-06-20 NOTE — Telephone Encounter (Signed)
Interpreter line used Cory Roughen PO#[id number] Patients husband is aware of her lab results

## 2014-06-20 NOTE — Telephone Encounter (Signed)
-----   Message from Lorayne Marek, MD sent at 06/20/2014  1:07 PM EDT ----- Call and let the Patient know that blood work is normal.

## 2014-07-08 ENCOUNTER — Emergency Department (HOSPITAL_COMMUNITY): Payer: Medicare Other

## 2014-07-08 ENCOUNTER — Emergency Department (HOSPITAL_COMMUNITY)
Admission: EM | Admit: 2014-07-08 | Discharge: 2014-07-08 | Disposition: A | Discharge status: Home / Self Care | Payer: Medicare Other | Attending: Emergency Medicine | Admitting: Emergency Medicine

## 2014-07-08 ENCOUNTER — Other Ambulatory Visit: Payer: Self-pay

## 2014-07-08 ENCOUNTER — Encounter (HOSPITAL_COMMUNITY): Payer: Self-pay | Admitting: Emergency Medicine

## 2014-07-08 DIAGNOSIS — I10 Essential (primary) hypertension: Secondary | ICD-10-CM | POA: Diagnosis not present

## 2014-07-08 DIAGNOSIS — F329 Major depressive disorder, single episode, unspecified: Secondary | ICD-10-CM | POA: Diagnosis not present

## 2014-07-08 DIAGNOSIS — R51 Headache: Secondary | ICD-10-CM | POA: Diagnosis not present

## 2014-07-08 DIAGNOSIS — Z86018 Personal history of other benign neoplasm: Secondary | ICD-10-CM | POA: Insufficient documentation

## 2014-07-08 DIAGNOSIS — Z87891 Personal history of nicotine dependence: Secondary | ICD-10-CM | POA: Diagnosis not present

## 2014-07-08 DIAGNOSIS — Z862 Personal history of diseases of the blood and blood-forming organs and certain disorders involving the immune mechanism: Secondary | ICD-10-CM | POA: Diagnosis not present

## 2014-07-08 DIAGNOSIS — Z79899 Other long term (current) drug therapy: Secondary | ICD-10-CM | POA: Diagnosis not present

## 2014-07-08 DIAGNOSIS — Z7951 Long term (current) use of inhaled steroids: Secondary | ICD-10-CM | POA: Insufficient documentation

## 2014-07-08 DIAGNOSIS — G43909 Migraine, unspecified, not intractable, without status migrainosus: Secondary | ICD-10-CM | POA: Diagnosis not present

## 2014-07-08 DIAGNOSIS — Z791 Long term (current) use of non-steroidal anti-inflammatories (NSAID): Secondary | ICD-10-CM | POA: Insufficient documentation

## 2014-07-08 DIAGNOSIS — R079 Chest pain, unspecified: Secondary | ICD-10-CM

## 2014-07-08 DIAGNOSIS — Z87442 Personal history of urinary calculi: Secondary | ICD-10-CM | POA: Insufficient documentation

## 2014-07-08 DIAGNOSIS — K219 Gastro-esophageal reflux disease without esophagitis: Secondary | ICD-10-CM | POA: Diagnosis not present

## 2014-07-08 DIAGNOSIS — Z8739 Personal history of other diseases of the musculoskeletal system and connective tissue: Secondary | ICD-10-CM | POA: Insufficient documentation

## 2014-07-08 DIAGNOSIS — G8929 Other chronic pain: Secondary | ICD-10-CM | POA: Insufficient documentation

## 2014-07-08 DIAGNOSIS — I2510 Atherosclerotic heart disease of native coronary artery without angina pectoris: Secondary | ICD-10-CM | POA: Diagnosis not present

## 2014-07-08 DIAGNOSIS — R0789 Other chest pain: Secondary | ICD-10-CM

## 2014-07-08 DIAGNOSIS — E119 Type 2 diabetes mellitus without complications: Secondary | ICD-10-CM | POA: Insufficient documentation

## 2014-07-08 LAB — TROPONIN I
Troponin I: <0.03 ng/mL (ref <0.031)
Troponin I: <0.03 ng/mL (ref <0.031)

## 2014-07-08 LAB — CBC
HCT: 40.9 % (ref 36.0–46.0)
Hemoglobin: 13.3 g/dL (ref 12.0–15.0)
MCH: 31.3 pg (ref 26.0–34.0)
MCHC: 32.5 g/dL (ref 30.0–36.0)
MCV: 96.2 fL (ref 78.0–100.0)
Platelets: 224 10*3/uL (ref 150–400)
RBC: 4.25 MIL/uL (ref 3.87–5.11)
RDW: 13 % (ref 11.5–15.5)
WBC: 7.8 10*3/uL (ref 4.0–10.5)

## 2014-07-08 LAB — BASIC METABOLIC PANEL
ANION GAP: 9 (ref 5–15)
BUN: 11 mg/dL (ref 6–20)
CO2: 24 mmol/L (ref 22–32)
Calcium: 8.4 mg/dL — ABNORMAL LOW (ref 8.9–10.3)
Chloride: 107 mmol/L (ref 101–111)
Creatinine, Ser: 0.65 mg/dL (ref 0.44–1.00)
Glucose, Bld: 106 mg/dL — ABNORMAL HIGH (ref 70–99)
POTASSIUM: 4.4 mmol/L (ref 3.5–5.1)
SODIUM: 140 mmol/L (ref 135–145)

## 2014-07-08 LAB — I-STAT TROPONIN, ED: Troponin i, poc: 0 ng/mL (ref 0.00–0.08)

## 2014-07-08 LAB — BRAIN NATRIURETIC PEPTIDE: B NATRIURETIC PEPTIDE 5: 111.4 pg/mL — AB (ref 0.0–100.0)

## 2014-07-08 MED ORDER — IOHEXOL 350 MG/ML SOLN
80.0000 mL | Freq: Once | INTRAVENOUS | Status: AC | PRN
Start: 2014-07-08 — End: 2014-07-08
  Administered 2014-07-08: 80 mL via INTRAVENOUS

## 2014-07-08 MED ORDER — ACETAMINOPHEN 325 MG PO TABS
650.0000 mg | ORAL_TABLET | Freq: Once | ORAL | Status: AC
  Administered 2014-07-08: 650 mg via ORAL
  Filled 2014-07-08: qty 2

## 2014-07-08 MED ORDER — IOHEXOL 300 MG/ML  SOLN: 80.0000 mL | Freq: Once | INTRAMUSCULAR | Status: DC | PRN

## 2014-07-08 MED ORDER — TRAMADOL HCL 50 MG PO TABS
50.0000 mg | ORAL_TABLET | Freq: Once | ORAL | Status: AC
  Administered 2014-07-08: 50 mg via ORAL
  Filled 2014-07-08: qty 1

## 2014-07-08 NOTE — ED Notes (Signed)
Pt stable, ambulatory, son at bedside, states understanding of discharge instructions

## 2014-07-08 NOTE — ED Notes (Signed)
Patient coming from home with c/o of central chest pain with radiation to the back and down the left arm with associated SOB and nausea.  Medications give are 324mg  Aspirin, 4 Nitro and 1" nitro paste and 4mg  Zofran.  Vital signs 125/82, 74 HR, 16 respirations, 97%.  Pain initially 4/10 after medications 2/10.

## 2014-07-08 NOTE — Discharge Instructions (Signed)
Dolor de pecho (no especfico) (Chest Pain (Nonspecific)) Call Dr.Avbuere tomorrow schedule an office visit. Ask him to help you to stop smoking. Con frecuencia es difcil dar un diagnstico especfico de la causa del dolor de [place]. Siempre hay una posibilidad de que el dolor podra estar relacionado con algo grave, como un ataque al corazn o un cogulo sanguneo en los pulmones. Debe someterse a controles con el mdico para ms evaluaciones. CAUSAS   Acidez.  Neumona o bronquitis.  Ansiedad o estrs.  Inflamacin de la zona que rodea al corazn (pericarditis) o a los pulmones (pleuritis o pleuresa).  Un cogulo sanguneo en el pulmn.  Colapso de un pulmn (neumotrax), que puede aparecer de [employer] repentina por s solo (neumotrax espontneo) o debido a un traumatismo en el trax.  Culebrilla (virus del herpes zster). La pared torcica est compuesta por huesos, msculos y Database administrator. Cualquiera de estos puede ser la fuente del dolor.  Puede haber una contusin en los huesos debido a una lesin.  Puede haber un esguince en los msculos o el cartlago ocasionado por la tos o por [place].  El cartlago puede verse afectado por una inflamacin y Engineer, production (costocondritis). DIAGNSTICO  Ileene Hutchinson se necesiten anlisis de laboratorio u otros estudios para Animator causa del Social research officer, government. Adems, puede indicarle que se haga una prueba llamada electrocadiograma (ECG) ambulatorio. El ECG registra los patrones de los latidos cardacos durante 24horas. Adems, pueden hacerle otros estudios, por ejemplo:  Ecocardiograma transtorcico (ETT). Durante IT trainer, se usan ondas sonoras para evaluar el flujo de la sangre a travs del corazn.  Ecocardiograma transesofgico (ETE).  Monitoreo cardaco. Permite que el mdico controle la frecuencia y el ritmo cardaco en tiempo real.  Monitor Holter. Es un dispositivo porttil que [employer] latidos cardacos y [place] a  Retail buyer las arritmias cardacas. Le permite al [employer] registrar la actividad [place], si es necesario.  Pruebas de estrs por ejercicio o por medicamentos que aceleran los latidos cardacos. TRATAMIENTO   El tratamiento depende de la causa del dolor de [place]. El tratamiento puede incluir:  Inhibidores de la acidez estomacal.  Antiinflamatorios.  Analgsicos para las enfermedades inflamatorias.  Antibiticos, si hay una infeccin.  Podrn aconsejarle que modifique su estilo de vida. Esto incluye dejar de fumar y evitar el alcohol, la cafena y el chocolate.  Pueden aconsejarle que mantenga la cabeza levantada (elevada) cuando duerme. Esto reduce la probabilidad de que el cido retroceda del estmago al esfago. En la [employer], el dolor de pecho no especfico mejorar en el trmino de 2 a 3das, con reposo y [employer].  INSTRUCCIONES PARA EL CUIDADO EN EL HOGAR   Si le prescriben antibiticos, tmelos tal como se le indic. Termnelos aunque comience a sentirse mejor.  [address], no haga actividades fsicas que provoquen dolor de [place]. Contine con las actividades fsicas tal como se le indic  No consuma ningn producto que contenga tabaco, incluidos cigarrillos, tabaco de Higher education careers adviser o cigarrillos electrnicos.  Evite el consumo de alcohol.  Tome los medicamentos solamente como se lo haya indicado el mdico.  Siga las sugerencias del mdico en lo que respecta a las pruebas adicionales, si el dolor de pecho no desaparece.  Concurra a todas las visitas de control programadas. Si no lo hace, podra desarrollar problemas permanentes (crnicos) relacionados con el dolor. Si hay algn problema para concurrir a una cita, llame para reprogramarla. SOLICITE ATENCIN MDICA SI:   El dolor de [place]  no desaparece, incluso despus del tratamiento.  Tiene una erupcin cutnea con ampollas en el pecho.  Tiene fiebre. SOLICITE  ATENCIN MDICA DE [facility] SI:   Aumenta el dolor de pecho o este se irradia hacia el brazo, el cuello, la [place], la espalda o el abdomen.  Le falta el aire.  La tos empeora, o expectora sangre.  Siente dolor intenso en la espalda o el abdomen.  Se siente nauseoso o vomita.  Siente debilidad intensa.  Se desmaya.  Tiene escalofros. Esto es Engineer, maintenance (IT). No espere a ver si el dolor se pasa. Obtenga ayuda mdica de inmediato. Llame a los servicios de emergencia locales (911 en [place]). No conduzca por sus propios medios [employer] hospital. ASEGRESE DE QUE:   Comprende estas instrucciones.  Controlar su afeccin.  Recibir ayuda de inmediato si no mejora o si empeora. Document Released: 02/14/2005 Document Revised: 02/19/2013 [facility] Patient Information 2015 [place]. This information is not intended to replace advice given to you by your health care provider. Make sure you discuss any questions you have with your health care provider.

## 2014-07-08 NOTE — ED Provider Notes (Signed)
Patient speaks no [place]. Her adult son was used as an Astronomer. I offered a professional medical interpreter which she declined Complained of anterior chest pain nonradiating onset 9:45 AM today. Worse with deep inspiration radiating to the back. Nonexertional. She's had similar pain in the past for which she had a nuclear medicine study, Myoview performed September 2015 which was normal. Pain lasted approximately 2 hours. Resolved after EMS placed nitroglycerin paste on her chest. Her only complaint presently is headache. No chest pain. On exam alert Glasgow Coma Score 15 lungs clear auscultation heart regular rate and rhythm no murmurs abdomen nondistended nontender extremity so edema skin warm dry Heart score equals 2 based on risk factors of smoking and family history and age. She will receive 50 Mill grams prior to discharge for her headache. Nitroglycerin paste can be removed. I strongly doubt cardiac etiology. Diagnosis atypical chest pain. She is instructed to follow-up with her primary care physician Dr.Avbuere Chest x-ray viewed by me Counseled patient for 5 minutes on smoking cessation Results for orders placed or performed during the hospital encounter of 07/08/14  CBC  Result Value Ref Range   WBC 7.8 4.0 - 10.5 K/uL   RBC 4.25 3.87 - 5.11 MIL/uL   Hemoglobin 13.3 12.0 - 15.0 g/dL   HCT 40.9 36.0 - 46.0 %   MCV 96.2 78.0 - 100.0 fL   MCH 31.3 26.0 - 34.0 pg   MCHC 32.5 30.0 - 36.0 g/dL   RDW 13.0 11.5 - 15.5 %   Platelets 224 150 - 400 K/uL  Basic metabolic panel  Result Value Ref Range   Sodium 140 135 - 145 mmol/L   Potassium 4.4 3.5 - 5.1 mmol/L   Chloride 107 101 - 111 mmol/L   CO2 24 22 - 32 mmol/L   Glucose, Bld 106 (H) 70 - 99 mg/dL   BUN 11 6 - 20 mg/dL   Creatinine, Ser 0.65 0.44 - 1.00 mg/dL   Calcium 8.4 (L) 8.9 - 10.3 mg/dL   GFR calc non Af Amer >60 >60 mL/min   GFR calc Af Amer >60 >60 mL/min   Anion gap 9 5 - 15  BNP (order ONLY if patient complains of  dyspnea/SOB AND you have documented it for THIS visit)  Result Value Ref Range   B Natriuretic Peptide 111.4 (H) 0.0 - 100.0 pg/mL  Troponin I  Result Value Ref Range   Troponin I <0.03 <0.031 ng/mL  Troponin I  Result Value Ref Range   Troponin I <0.03 <0.031 ng/mL  I-stat troponin, ED  (not at [facility], [facility])  Result Value Ref Range   Troponin i, poc 0.00 0.00 - 0.08 ng/mL   Comment 3           Dg Knee Bilateral Standing Ap  06/18/2014   CLINICAL DATA:  Chronic knee pain bilaterally, no injury  EXAM: BILATERAL KNEES STANDING - 1 VIEW  COMPARISON:  None.  FINDINGS: A standing view of the knees shows relatively well preserved knee joint spaces. No significant degenerative change is seen. Alignment is normal.  IMPRESSION: Negative frontal standing view of the knees.   Electronically Signed   By: Ivar Drape M.D.   On: 06/18/2014 15:18   Ct Angio Chest Pe W/cm &/or Wo Cm  07/08/2014   CLINICAL DATA:  Chest pain  EXAM: CT ANGIOGRAPHY CHEST WITH CONTRAST  TECHNIQUE: Multidetector CT imaging of the chest was performed using the standard protocol during bolus administration of intravenous contrast. Multiplanar CT  image reconstructions and MIPs were obtained to evaluate the vascular anatomy.  CONTRAST:  98mL OMNIPAQUE IOHEXOL 350 MG/ML SOLN  COMPARISON:  Chest radiograph Jul 08, 2014  FINDINGS: There is no demonstrable pulmonary embolus. There is no demonstrable thoracic aortic aneurysm. There is no periaortic fluid.  There is calcification in the proximal left anterior descending coronary artery.  There is no lung edema or consolidation. There is no appreciable thoracic adenopathy. Visualized thyroid appears normal. Pericardium is not thickened.  In the visualized upper abdomen, there is evidence of previous gastric bypass procedure.  There are no blastic or lytic bone lesions.  Review of the MIP images confirms the above findings.  IMPRESSION: No demonstrable pulmonary embolus. No thoracic aortic aneurysm.  There is coronary artery calcification present. No lung edema or consolidation. Status post gastric bypass procedure.   Electronically Signed   By: Lowella Grip III M.D.   On: 07/08/2014 16:21   Dg Chest Port 1 View  07/08/2014   CLINICAL DATA:  59 year old female with chest pain since this morning. History of high blood pressure and smoking. Initial encounter.  EXAM: PORTABLE CHEST - 1 VIEW  COMPARISON:  03/30/2014.  FINDINGS: Prominence of the thoracic aorta may be related to the AP magnification and poor inspiration however if there is any clinical suspicion of thoracic aortic abnormality, CT imaging may be considered for further delineation.  Slight elevation left hemidiaphragm.  Mild central pulmonary vascular prominence without pulmonary edema.  No segmental consolidation or gross pneumothorax.  Heart size top-normal.  IMPRESSION: Prominence of the thoracic aorta may be related to the AP magnification and poor inspiration however if there is any clinical suspicion of thoracic aortic abnormality, CT imaging may be considered for further delineation.  Mild central pulmonary vascular prominence without pulmonary edema.  No segmental consolidation or gross pneumothorax.   Electronically Signed   By: Genia Del M.D.   On: 07/08/2014 11:52   Dx #1 atypical chest pain #2 tobacco abuse  Orlie Dakin, MD 07/08/14 1818

## 2014-07-08 NOTE — ED Provider Notes (Signed)
CSN: [id number]     Arrival date & time 07/08/14  1107 History   First MD Initiated Contact with Patient 07/08/14 1119     Chief Complaint  Patient presents with  . Chest Pain     (Consider location/radiation/quality/duration/timing/severity/associated sxs/prior Treatment) Patient is a 59 y.o. female presenting with chest pain. The history is provided by the patient. No language interpreter was used.  Chest Pain Pain location:  L chest Pain quality: aching and radiating   Pain radiates to:  L arm Pain radiates to the back: no   Pain severity:  Severe Onset quality:  Sudden Duration:  3 hours Timing:  Constant Progression:  Improving Context: not breathing, no movement and not raising an arm   Relieved by:  Nitroglycerin, oxygen and aspirin Worsened by:  Nothing tried Ineffective treatments:  None tried Risk factors: hypertension     Past Medical History  Diagnosis Date  . Hypertension   . Kidney stones   . Depression   . Tachycardia   . Angiomyolipoma of right kidney   . GERD (gastroesophageal reflux disease)   . Type II diabetes mellitus     "took Metformin"; resolved S/P gastric bypass  . Anemia   . Hepatitis     "when I was pregnant; got the one that's not bad"   . Migraine     "q few months" (11/04/2013)  . Arthritis     "hands, feet, back, neck" (11/04/2013)  . DDD (degenerative disc disease)   . Chronic back pain     "neck down into lower back" (11/04/2013)   Past Surgical History  Procedure Laterality Date  . Gastric bypass  2008  . Appendectomy  ~ 1972  . Reduction mammaplasty Bilateral ~ 2010  . Kidney stone surgery  ~ 2004    "cut them out"  . Esophagogastroduodenoscopy (egd) with propofol N/A 11/28/2013    Procedure: ESOPHAGOGASTRODUODENOSCOPY (EGD) WITH PROPOFOL;  Surgeon: Inda Castle, MD;  Location: [facility];  Service: Endoscopy;  Laterality: N/A;   Family History  Problem Relation Age of Onset  . Diabetes Mother   . Heart disease Father     History  Substance Use Topics  . Smoking status: Former Smoker -- 0.25 packs/day for 43 years  . Smokeless tobacco: Never Used  . Alcohol Use: No   OB History    Gravida Para Term Preterm AB TAB SAB Ectopic Multiple Living   5 3 3  2 2    3      Review of Systems  Cardiovascular: Positive for chest pain.  All other systems reviewed and are negative.     Allergies  Review of patient's allergies indicates no known allergies.  Home Medications   Prior to Admission medications   Medication Sig Start Date End Date Taking? Authorizing Provider  amiodarone (PACERONE) 200 MG tablet Take 200 mg by mouth daily as needed (for heart racing).    Historical Provider, MD  diclofenac (VOLTAREN) 75 MG EC tablet Take 1 tablet (75 mg total) by mouth 2 (two) times daily. 02/03/14   Lorayne Marek, MD  enalapril (VASOTEC) 10 MG tablet Take 10 mg by mouth daily.    Historical Provider, MD  ergocalciferol (VITAMIN D2) 50000 UNITS capsule Take 50,000 Units by mouth once a week. On Monday    Historical Provider, MD  ferrous sulfate 325 (65 FE) MG tablet Take 325 mg by mouth daily with breakfast.    Historical Provider, MD  fluticasone (FLONASE) 50 MCG/ACT nasal spray Place  2 sprays into both nostrils daily. 06/18/14   Lorayne Marek, MD  gabapentin (NEURONTIN) 300 MG capsule Take 1 capsule (300 mg total) by mouth 3 (three) times daily. 03/24/14   Lorayne Marek, MD  Hyoscyamine Sulfate 0.375 MG TBCR Take 1 tab bid prn abdominal pain Patient not taking: Reported on 03/30/2014 11/28/13   Inda Castle, MD  meclizine (ANTIVERT) 25 MG tablet Take 1 tablet (25 mg total) by mouth 3 (three) times daily as needed for dizziness. 06/18/14   Lorayne Marek, MD  metoprolol (LOPRESSOR) 50 MG tablet Take 1 tablet (50 mg total) by mouth 2 (two) times daily. 06/18/14   Lorayne Marek, MD  oxybutynin (DITROPAN-XL) 5 MG 24 hr tablet Take 1 tablet (5 mg total) by mouth at bedtime. 02/03/14   Lorayne Marek, MD  oxymetazoline  (AFRIN) 0.05 % nasal spray Place 1 spray into both nostrils 2 (two) times daily.    Historical Provider, MD  pantoprazole (PROTONIX) 40 MG tablet Take 1 tablet (40 mg total) by mouth daily. 06/18/14   Lorayne Marek, MD  PRESCRIPTION MEDICATION Place 1 spray into alternate nostrils daily as needed. For nasal congestion    Historical Provider, MD  traMADol-acetaminophen (ULTRACET) 37.5-325 MG per tablet Take 1 tablet by mouth every 8 (eight) hours as needed for moderate pain. 06/18/14   Lorayne Marek, MD   SpO2 97% Physical Exam  Constitutional: She is oriented to person, place, and time. She appears well-developed and well-nourished.  HENT:  Head: Normocephalic.  Right Ear: External ear normal.  Left Ear: External ear normal.  Nose: Nose normal.  Mouth/Throat: Oropharynx is clear and moist.  Eyes: Conjunctivae and EOM are normal. Pupils are equal, round, and reactive to light.  Neck: Normal range of motion.  Cardiovascular: Normal rate and normal heart sounds.   Pulmonary/Chest: Effort normal and breath sounds normal.  Abdominal: Soft. She exhibits no distension.  Musculoskeletal: Normal range of motion.  Neurological: She is alert and oriented to person, place, and time.  Skin: Skin is warm.  Psychiatric: She has a normal mood and affect.  Nursing note and vitals reviewed.   ED Course  Procedures (including critical care time) Labs Review Labs Reviewed - No data to display  Imaging Review Dg Chest Port 1 View  07/08/2014   CLINICAL DATA:  59 year old female with chest pain since this morning. History of high blood pressure and smoking. Initial encounter.  EXAM: PORTABLE CHEST - 1 VIEW  COMPARISON:  03/30/2014.  FINDINGS: Prominence of the thoracic aorta may be related to the AP magnification and poor inspiration however if there is any clinical suspicion of thoracic aortic abnormality, CT imaging may be considered for further delineation.  Slight elevation left hemidiaphragm.  Mild  central pulmonary vascular prominence without pulmonary edema.  No segmental consolidation or gross pneumothorax.  Heart size top-normal.  IMPRESSION: Prominence of the thoracic aorta may be related to the AP magnification and poor inspiration however if there is any clinical suspicion of thoracic aortic abnormality, CT imaging may be considered for further delineation.  Mild central pulmonary vascular prominence without pulmonary edema.  No segmental consolidation or gross pneumothorax.   Electronically Signed   By: Genia Del M.D.   On: 07/08/2014 11:52     EKG Interpretation   Date/Time:  Tuesday Jul 08 2014 11:12:42 EDT Ventricular Rate:  63 PR Interval:  148 QRS Duration: 86 QT Interval:  382 QTC Calculation: 390 R Axis:   24 Text Interpretation:  Normal sinus rhythm  Normal ECG Confirmed by ZAMMIT   MD, JOSEPH [phone]) on 07/08/2014 1:58:38 PM      MDM  Pt reports pain relieved with nitro, pt has a slight headache.   Troponin negative.    Radiologist reports prominence of aorta.   Ct scan ordered.     Final diagnoses:  Chest pain        Fransico Meadow, PA-C 07/08/14 1512  Milton Ferguson, MD 07/09/14 [phone]

## 2014-07-23 ENCOUNTER — Telehealth: Payer: Self-pay | Admitting: Internal Medicine

## 2014-07-23 DIAGNOSIS — E669 Obesity, unspecified: Secondary | ICD-10-CM | POA: Diagnosis not present

## 2014-07-23 DIAGNOSIS — G894 Chronic pain syndrome: Secondary | ICD-10-CM | POA: Diagnosis not present

## 2014-07-23 DIAGNOSIS — M488X6 Other specified spondylopathies, lumbar region: Secondary | ICD-10-CM | POA: Diagnosis not present

## 2014-07-23 DIAGNOSIS — M545 Low back pain: Secondary | ICD-10-CM | POA: Diagnosis not present

## 2014-07-23 DIAGNOSIS — Z79899 Other long term (current) drug therapy: Secondary | ICD-10-CM | POA: Diagnosis not present

## 2014-07-23 NOTE — Telephone Encounter (Signed)
Pt now has insurance and is requesting referral for home health aide.  Pt says she has been seen recently and is not sure whether she needs to come back in for referral or if referral can be made based on previous notes. Please f/u with pt.

## 2014-08-05 ENCOUNTER — Telehealth: Payer: Self-pay | Admitting: Internal Medicine

## 2014-08-05 DIAGNOSIS — G894 Chronic pain syndrome: Secondary | ICD-10-CM | POA: Diagnosis not present

## 2014-08-05 DIAGNOSIS — E669 Obesity, unspecified: Secondary | ICD-10-CM | POA: Diagnosis not present

## 2014-08-05 DIAGNOSIS — Z79899 Other long term (current) drug therapy: Secondary | ICD-10-CM | POA: Diagnosis not present

## 2014-08-05 DIAGNOSIS — M47817 Spondylosis without myelopathy or radiculopathy, lumbosacral region: Secondary | ICD-10-CM | POA: Diagnosis not present

## 2014-08-05 DIAGNOSIS — M545 Low back pain: Secondary | ICD-10-CM | POA: Diagnosis not present

## 2014-08-05 DIAGNOSIS — M488X6 Other specified spondylopathies, lumbar region: Secondary | ICD-10-CM | POA: Diagnosis not present

## 2014-08-05 NOTE — Telephone Encounter (Signed)
Pt came into office requesting status of  referral to home health aide. Please f/u with patient.

## 2014-08-20 ENCOUNTER — Encounter: Payer: Self-pay | Admitting: Internal Medicine

## 2014-08-20 ENCOUNTER — Ambulatory Visit: Payer: Medicare Other | Attending: Internal Medicine | Admitting: Internal Medicine

## 2014-08-20 VITALS — BP 126/85 | HR 86 | Temp 98.0°F | Resp 16 | Wt 230.0 lb

## 2014-08-20 DIAGNOSIS — Z79899 Other long term (current) drug therapy: Secondary | ICD-10-CM | POA: Diagnosis not present

## 2014-08-20 DIAGNOSIS — M25561 Pain in right knee: Secondary | ICD-10-CM | POA: Diagnosis not present

## 2014-08-20 DIAGNOSIS — M2550 Pain in unspecified joint: Secondary | ICD-10-CM | POA: Diagnosis not present

## 2014-08-20 DIAGNOSIS — Z9884 Bariatric surgery status: Secondary | ICD-10-CM | POA: Diagnosis not present

## 2014-08-20 DIAGNOSIS — I10 Essential (primary) hypertension: Secondary | ICD-10-CM | POA: Insufficient documentation

## 2014-08-20 DIAGNOSIS — H8110 Benign paroxysmal vertigo, unspecified ear: Secondary | ICD-10-CM

## 2014-08-20 DIAGNOSIS — R000 Tachycardia, unspecified: Secondary | ICD-10-CM | POA: Diagnosis not present

## 2014-08-20 DIAGNOSIS — M25562 Pain in left knee: Secondary | ICD-10-CM | POA: Diagnosis not present

## 2014-08-20 DIAGNOSIS — K219 Gastro-esophageal reflux disease without esophagitis: Secondary | ICD-10-CM | POA: Insufficient documentation

## 2014-08-20 DIAGNOSIS — Z87891 Personal history of nicotine dependence: Secondary | ICD-10-CM | POA: Diagnosis not present

## 2014-08-20 DIAGNOSIS — G894 Chronic pain syndrome: Secondary | ICD-10-CM

## 2014-08-20 DIAGNOSIS — H9202 Otalgia, left ear: Secondary | ICD-10-CM

## 2014-08-20 MED ORDER — MECLIZINE HCL 25 MG PO TABS: 25.0000 mg | ORAL_TABLET | Freq: Three times a day (TID) | ORAL | Status: AC | PRN

## 2014-08-20 MED ORDER — GABAPENTIN 300 MG PO CAPS: 300.0000 mg | ORAL_CAPSULE | Freq: Three times a day (TID) | ORAL | Status: AC

## 2014-08-20 MED ORDER — TRAMADOL-ACETAMINOPHEN 37.5-325 MG PO TABS: 1.0000 | ORAL_TABLET | Freq: Three times a day (TID) | ORAL | Status: DC | PRN

## 2014-08-20 NOTE — Progress Notes (Signed)
ASSESSMENT: Pt currently experiencing motivation to advocate for herself regarding obtaining home health aid. Pt would benefit from a referral to home health aid services.  Stage of Change: action  PLAN: 1. F/U with behavioral health consultant in as needed 2. Psychiatric Medications: none. 3. Behavioral recommendation(s):   -Expect call from [place] for home health care  SUBJECTIVE: Pt. referred by Dr Annitta Needs for home health aid referral:  Pt. here for referral regarding home health aid referral.  Pt. reports the following symptoms/concerns: Pt states that she previously had home health aid from [place], and would like to continue receiving that service, as it was very helpful to her in the past.  Duration of problem: <one month Severity: mild  OBJECTIVE: Orientation & Cognition: Oriented x3. Thought processes normal and appropriate to situation. Mood: appropriate. Affect: appropriate Appearance: appropriate Risk of harm to self or others: no risk of harm to self or others Substance use: none Psychiatric medication use: Unchanged from prior contact. Assessments administered: none  Diagnosis: Problem related to psychosocial circumstances CPT Code: Z65.9 -------------------------------------------- Other(s) present in the room: [place]  Time spent with patient in exam room: 16 minutes

## 2014-08-20 NOTE — Progress Notes (Signed)
Patient here for follow up Complains of left ear pain that has gotten worse over the past couple of days Patient not able to sleep on her left side due to the pain Patient also still complains of her vertigo Patient is requesting a referral for home [facility] Patient has brought paper work from [place] showing she had home help when she lived there

## 2014-08-20 NOTE — Progress Notes (Signed)
MRN: [id number] Name: Shannon Andrade  Sex: female Age: 59 y.o. DOB: 08-06-1955  Allergies: Review of patient's allergies indicates no known allergies.  Chief Complaint  Patient presents with  . Ear Pain    HPI: Patient is 59 y.o. female who history of chronic pain syndrome, arthralgia, chronic vertigo, comes today still complaining of ear pain denies any fever chills, has chronic low back pain chronic bilateral knee pain though her x-ray was negative for DJD in her knees, she is going to see a pain management, patient reported to have these symptoms chronically and when she was in jail she had a home health aid and she would like to have another referral, denies any new symptoms denies any fever chills chest and shortness of breath.  Past Medical History  Diagnosis Date  . Hypertension   . Kidney stones   . Depression   . Tachycardia   . Angiomyolipoma of right kidney   . GERD (gastroesophageal reflux disease)   . Type II diabetes mellitus     "took Metformin"; resolved S/P gastric bypass  . Anemia   . Hepatitis     "when I was pregnant; got the one that's not bad"   . Migraine     "q few months" (11/04/2013)  . Arthritis     "hands, feet, back, neck" (11/04/2013)  . DDD (degenerative disc disease)   . Chronic back pain     "neck down into lower back" (11/04/2013)    Past Surgical History  Procedure Laterality Date  . Gastric bypass  2008  . Appendectomy  ~ 1972  . Reduction mammaplasty Bilateral ~ 2010  . Kidney stone surgery  ~ 2004    "cut them out"  . Esophagogastroduodenoscopy (egd) with propofol N/A 11/28/2013    Procedure: ESOPHAGOGASTRODUODENOSCOPY (EGD) WITH PROPOFOL;  Surgeon: Inda Castle, MD;  Location: [facility];  Service: Endoscopy;  Laterality: N/A;      Medication List       This list is accurate as of: 08/20/14  5:32 PM.  Always use your most recent med list.               amiodarone 200 MG tablet  Commonly known as:  PACERONE  Take  200 mg by mouth daily as needed (for heart racing).     diclofenac 75 MG EC tablet  Commonly known as:  VOLTAREN  Take 1 tablet (75 mg total) by mouth 2 (two) times daily.     enalapril 10 MG tablet  Commonly known as:  VASOTEC  Take 10 mg by mouth daily.     ergocalciferol 50000 UNITS capsule  Commonly known as:  VITAMIN D2  Take 50,000 Units by mouth once a week. On Monday     fluticasone 50 MCG/ACT nasal spray  Commonly known as:  FLONASE  Place 2 sprays into both nostrils daily.     gabapentin 300 MG capsule  Commonly known as:  NEURONTIN  Take 1 capsule (300 mg total) by mouth 3 (three) times daily.     Hyoscyamine Sulfate 0.375 MG Tbcr  Take 1 tab bid prn abdominal pain     meclizine 25 MG tablet  Commonly known as:  ANTIVERT  Take 1 tablet (25 mg total) by mouth 3 (three) times daily as needed for dizziness.     metoprolol 50 MG tablet  Commonly known as:  LOPRESSOR  Take 1 tablet (50 mg total) by mouth 2 (two) times daily.  oxybutynin 5 MG 24 hr tablet  Commonly known as:  DITROPAN XL  Take 1 tablet (5 mg total) by mouth at bedtime.     pantoprazole 40 MG tablet  Commonly known as:  PROTONIX  Take 1 tablet (40 mg total) by mouth daily.     traMADol-acetaminophen 37.5-325 MG per tablet  Commonly known as:  ULTRACET  Take 1 tablet by mouth every 8 (eight) hours as needed for moderate pain.        Meds ordered this encounter  Medications  . traMADol-acetaminophen (ULTRACET) 37.5-325 MG per tablet    Sig: Take 1 tablet by mouth every 8 (eight) hours as needed for moderate pain.    Dispense:  60 tablet    Refill:  0  . meclizine (ANTIVERT) 25 MG tablet    Sig: Take 1 tablet (25 mg total) by mouth 3 (three) times daily as needed for dizziness.    Dispense:  90 tablet    Refill:  2  . gabapentin (NEURONTIN) 300 MG capsule    Sig: Take 1 capsule (300 mg total) by mouth 3 (three) times daily.    Dispense:  90 capsule    Refill:  3    There is no  immunization history for the selected administration types on file for this patient.  Family History  Problem Relation Age of Onset  . Diabetes Mother   . Heart disease Father     History  Substance Use Topics  . Smoking status: Former Smoker -- 0.25 packs/day for 43 years  . Smokeless tobacco: Never Used  . Alcohol Use: No    Review of Systems   As noted in HPI  Filed Vitals:   08/20/14 1417  BP: 126/85  Pulse: 86  Temp: 98 F (36.7 C)  Resp: 16    Physical Exam  Physical Exam  HENT:  Bilateral ear canals clean , tympanic membrane not congested  Eyes: EOM are normal. Pupils are equal, round, and reactive to light.  Cardiovascular: Normal rate and regular rhythm.   Pulmonary/Chest: Breath sounds normal. No respiratory distress. She has no wheezes. She has no rales.  Musculoskeletal: She exhibits no edema.    CBC    Component Value Date/Time   WBC 7.8 07/08/2014 1154   RBC 4.25 07/08/2014 1154   HGB 13.3 07/08/2014 1154   HCT 40.9 07/08/2014 1154   PLT 224 07/08/2014 1154   MCV 96.2 07/08/2014 1154   LYMPHSABS 3.0 03/30/2014 1635   MONOABS 0.6 03/30/2014 1635   EOSABS 0.1 03/30/2014 1635   BASOSABS 0.0 03/30/2014 1635    CMP     Component Value Date/Time   NA 140 07/08/2014 1154   K 4.4 07/08/2014 1154   CL 107 07/08/2014 1154   CO2 24 07/08/2014 1154   GLUCOSE 106* 07/08/2014 1154   BUN 11 07/08/2014 1154   CREATININE 0.65 07/08/2014 1154   CREATININE 0.65 05/28/2013 1532   CALCIUM 8.4* 07/08/2014 1154   PROT 6.3 03/30/2014 1635   ALBUMIN 3.4* 03/30/2014 1635   AST 23 03/30/2014 1635   ALT 17 03/30/2014 1635   ALKPHOS 82 03/30/2014 1635   BILITOT 0.5 03/30/2014 1635   GFRNONAA >60 07/08/2014 1154   GFRNONAA >89 05/28/2013 1532   GFRAA >60 07/08/2014 1154   GFRAA >89 05/28/2013 1532    Lab Results  Component Value Date/Time   CHOL 127 11/05/2013 01:41 AM    Lab Results  Component Value Date/Time   HGBA1C 5.3 05/28/2013 04:14 PM  Lab Results  Component Value Date/Time   AST 23 03/30/2014 04:35 PM    Assessment and Plan  BPPV (benign paroxysmal positional vertigo), unspecified laterality/Otalgia, left - Plan:patient is given refill on meclizine (ANTIVERT) 25 MG tablet, she already has referral to see ENT.  Chronic pain syndrome/Multiple joint pain  - Plan: traMADol-acetaminophen (ULTRACET) 37.5-325 MG per tablet, gabapentin (NEURONTIN) 300 MG capsule  Arthralgia of both knees - Plan: traMADol-acetaminophen (ULTRACET) 37.5-325 MG per tablet  Patient is referred to social worker to help with home health aid services.  Return in about 3 months (around 11/20/2014), or if symptoms worsen or fail to improve.   This note has been created with Surveyor, quantity. Any transcriptional errors are unintentional.    Lorayne Marek, MD

## 2014-08-26 DIAGNOSIS — H81312 Aural vertigo, left ear: Secondary | ICD-10-CM | POA: Diagnosis not present

## 2014-08-26 DIAGNOSIS — H9193 Unspecified hearing loss, bilateral: Secondary | ICD-10-CM | POA: Diagnosis not present

## 2014-08-26 DIAGNOSIS — H9202 Otalgia, left ear: Secondary | ICD-10-CM | POA: Diagnosis not present

## 2014-09-09 NOTE — Telephone Encounter (Signed)
Pt was seen in clinic for referral

## 2014-09-26 ENCOUNTER — Encounter: Payer: Medicare Other | Attending: Physical Medicine & Rehabilitation

## 2014-09-26 ENCOUNTER — Ambulatory Visit: Payer: Medicare Other | Admitting: Physical Medicine & Rehabilitation

## 2014-09-26 ENCOUNTER — Ambulatory Visit: Payer: Self-pay | Admitting: Physical Medicine & Rehabilitation

## 2014-11-02 ENCOUNTER — Encounter (HOSPITAL_COMMUNITY): Payer: Self-pay | Admitting: *Deleted

## 2014-11-02 ENCOUNTER — Encounter (HOSPITAL_COMMUNITY): Payer: Self-pay

## 2014-11-02 ENCOUNTER — Emergency Department (HOSPITAL_COMMUNITY)
Admission: EM | Admit: 2014-11-02 | Discharge: 2014-11-03 | Discharge status: Against Medical Advice | Payer: Medicare Other | Source: Home / Self Care | Attending: Emergency Medicine | Admitting: Emergency Medicine

## 2014-11-02 ENCOUNTER — Emergency Department (HOSPITAL_COMMUNITY)
Admission: EM | Admit: 2014-11-02 | Discharge: 2014-11-02 | Disposition: A | Discharge status: Home / Self Care | Payer: Medicare Other | Attending: Emergency Medicine | Admitting: Emergency Medicine

## 2014-11-02 DIAGNOSIS — Z8659 Personal history of other mental and behavioral disorders: Secondary | ICD-10-CM | POA: Diagnosis not present

## 2014-11-02 DIAGNOSIS — G8929 Other chronic pain: Secondary | ICD-10-CM

## 2014-11-02 DIAGNOSIS — R079 Chest pain, unspecified: Secondary | ICD-10-CM | POA: Insufficient documentation

## 2014-11-02 DIAGNOSIS — M47812 Spondylosis without myelopathy or radiculopathy, cervical region: Secondary | ICD-10-CM | POA: Diagnosis not present

## 2014-11-02 DIAGNOSIS — Z87442 Personal history of urinary calculi: Secondary | ICD-10-CM | POA: Diagnosis not present

## 2014-11-02 DIAGNOSIS — E119 Type 2 diabetes mellitus without complications: Secondary | ICD-10-CM | POA: Insufficient documentation

## 2014-11-02 DIAGNOSIS — I471 Supraventricular tachycardia: Secondary | ICD-10-CM | POA: Insufficient documentation

## 2014-11-02 DIAGNOSIS — I10 Essential (primary) hypertension: Secondary | ICD-10-CM

## 2014-11-02 DIAGNOSIS — M19042 Primary osteoarthritis, left hand: Secondary | ICD-10-CM | POA: Diagnosis not present

## 2014-11-02 DIAGNOSIS — Z86018 Personal history of other benign neoplasm: Secondary | ICD-10-CM | POA: Diagnosis not present

## 2014-11-02 DIAGNOSIS — R0602 Shortness of breath: Secondary | ICD-10-CM | POA: Diagnosis present

## 2014-11-02 DIAGNOSIS — Z87891 Personal history of nicotine dependence: Secondary | ICD-10-CM | POA: Diagnosis not present

## 2014-11-02 DIAGNOSIS — Z862 Personal history of diseases of the blood and blood-forming organs and certain disorders involving the immune mechanism: Secondary | ICD-10-CM | POA: Diagnosis not present

## 2014-11-02 DIAGNOSIS — M19079 Primary osteoarthritis, unspecified ankle and foot: Secondary | ICD-10-CM | POA: Insufficient documentation

## 2014-11-02 DIAGNOSIS — G43909 Migraine, unspecified, not intractable, without status migrainosus: Secondary | ICD-10-CM | POA: Diagnosis not present

## 2014-11-02 DIAGNOSIS — K219 Gastro-esophageal reflux disease without esophagitis: Secondary | ICD-10-CM | POA: Insufficient documentation

## 2014-11-02 DIAGNOSIS — M19041 Primary osteoarthritis, right hand: Secondary | ICD-10-CM | POA: Insufficient documentation

## 2014-11-02 DIAGNOSIS — M47819 Spondylosis without myelopathy or radiculopathy, site unspecified: Secondary | ICD-10-CM | POA: Diagnosis not present

## 2014-11-02 DIAGNOSIS — Z79899 Other long term (current) drug therapy: Secondary | ICD-10-CM | POA: Diagnosis not present

## 2014-11-02 HISTORY — DX: Supraventricular tachycardia, unspecified: I47.10

## 2014-11-02 HISTORY — DX: Supraventricular tachycardia: I47.1

## 2014-11-02 LAB — CBC
HCT: 45.9 % (ref 36.0–46.0)
Hemoglobin: 15.3 g/dL — ABNORMAL HIGH (ref 12.0–15.0)
MCH: 32.3 pg (ref 26.0–34.0)
MCHC: 33.3 g/dL (ref 30.0–36.0)
MCV: 97 fL (ref 78.0–100.0)
Platelets: 276 10*3/uL (ref 150–400)
RBC: 4.73 MIL/uL (ref 3.87–5.11)
RDW: 12.5 % (ref 11.5–15.5)
WBC: 9.5 10*3/uL (ref 4.0–10.5)

## 2014-11-02 LAB — BASIC METABOLIC PANEL
Anion gap: 9 (ref 5–15)
BUN: 9 mg/dL (ref 6–20)
CO2: 24 mmol/L (ref 22–32)
CREATININE: 0.67 mg/dL (ref 0.44–1.00)
Calcium: 9.3 mg/dL (ref 8.9–10.3)
Chloride: 106 mmol/L (ref 101–111)
GFR calc Af Amer: >60 mL/min (ref >60)
GFR calc non Af Amer: >60 mL/min (ref >60)
Glucose, Bld: 116 mg/dL — ABNORMAL HIGH (ref 65–99)
POTASSIUM: 4.4 mmol/L (ref 3.5–5.1)
SODIUM: 139 mmol/L (ref 135–145)

## 2014-11-02 LAB — TROPONIN I

## 2014-11-02 NOTE — ED Notes (Addendum)
Pt here from home for chest pain she describes as pressure.  Was discharged from here this am after being tx for SVT and told to come back if pain returned.  Also states her bp is elevated.  Normotensive in triage.  Pt drinks a 12 pack of beer per day.

## 2014-11-02 NOTE — Discharge Instructions (Signed)
Taquicardia supraventricular (Supraventricular Tachycardia) Taquicardia supraventricular (TSV) significa que el corazn tiene un ritmo anormal (arritmia) en el que late muy rpido (taquicardia). Este tipo de ritmo cardaco rpido se desarrolla en las cmaras superiores del corazn (atria). Una TSV puede ocasionar que el corazn tenga un ritmo de ms de 100 latidos por minuto. La TSV puede provocar un rpido estallido de latidos. La TSV puede aparecer y desaparecer de repente sin advertencia y se denomina no sostenida. La TSV puede ser sostenida, en la que el corazn late a un ritmo rpido y continuado.  CAUSAS Hay diferentes causas de TSV. Entre ellos se incluyen:  Problemas en las vlvulas cardacas como Prolapso de vlvula mitral.  Corazn agrandado (cardiomiopata hipertrfica).  Enfermedades cardiacas congnitas.  Inflamacin del corazn (pericarditis).  Hipertiroidismo.  Bajos niveles de potasio o magnesio.  Cafena.  El consumo de drogas como la cocana y las metanfetaminas o estimulantes.  Algunos medicamentos de venta libre como:  Decongestivos.  Medicamentos de dieta.  Hierbas medicinales. SNTOMAS Los sntomas de la TSV pueden variar. Los sntomas dependen de si la TSV es sostenida o no sostenida. Estas pueden ser:  Ningn sntoma (asntomtico).  Sensacin de latidos cardacos rpidos en el pecho (palpitaciones).  Falta de aire.  Dolor o presin Architectural technologist. Si la presin sangunea decae debido a la TSV, podr experimentar:  Desvanecimiento o desmayo.  Debilidad.  Mareos. DIAGNSTICO Podrn realizarse diferentes pruebas para diagnosticar TSV como:  Un electrocardiograma (ECG). Es un procedimiento indoloro que mide todo registro de [place] elctrica del corazn.  Monitoreo Holter. Es un registro de 24 horas su ritmo cardaco. Le darn un diario. Anote todos los sntomas que tenga y lo que estaba haciendo en el momento en que los experiment.  Monitor  de arritmias. Es un pequeo dispositivo que se Youth worker. Registra el ritmo cardaco cuando tiene sntomas.  Ecocardiograma. Esta es una prueba por imgenes que ayuda a diagnosticar una estructura cardaca anormal como en el caso de las anormalidades genticas, problemas en las vlvulas del corazn o un agrandamiento cardaco.  Pruebas de estrs. Esta prueba le ayudar a determinar si la TSV est relacionada con el ejercicio.  Estudios de Scientist, research (physical sciences) (EEF). Es un procedimiento que evala el sistema elctrico del corazn y puede ayudarle al medico a Pension scheme manager la causa de la TSV. TRATAMIENTO El tratamiento de la TSV depende de los sntomas, su recurrencia y si existen problemas cardacos subyacentes.   Si los sntomas son poco comunes o no existe una enfermedad cardaca, no suele Psychologist, prison and probation services.  Se realizar un anlisis de sangre para controlar el potasio, magnesio y los niveles de la hormona tiroides para ver si hay anormalidades. Si estos niveles son anormales, se Arts administrator para Academic librarian. Medicamentos: El mdico podr [employer] medicamentos por va oral para tratar la TSV. Estos medicamentos se administran para un control a largo plazo de la TSV. Los medicamentos pueden utilizarse solos o en conjunto con otros tratamientos. Se utilizan para enlentecer los impulsos nerviosos en el msculo cardaco. Los medicamentos tambin pueden usarse para tratar la presin alta. Algunos de [employer] son:  Electrical engineer de los canales de calcio.  Bloqueadores Beta.  Digoxina. Procedimientos no quirrgicos: Las tcnicas no quirrgicas pueden utilizarse si los medicamentos orales no funcionan. Algunos ejemplos:  Cardioversin. En esta tcnica se utilizan frmacos o un shock elctrico para restablecer el ritmo cardaco normal.  Las drogas de cardioversin podrn administrarse por va intravenosa (IV) para ayudar a recomponer el ritmo  cardaco.  En la cardioversin elctrica, el mdico enva shocks al corazn para interrumpir su latido por una fraccin de segundo. Esto ayudar a que el corazn vuelva a su ritmo normal.  Ablacin. Este procedimiento se realiza bajo anestesia superficial. Se utilizan ondas de radio de alta frecuencia para destruir la zona del tejido del corazn responsable de la TSV. INSTRUCCIONES PARA EL CUIDADO DOMICILIARIO  No fumar.  Tome slo los medicamentos prescriptos por el profesional que lo asiste. Consulte con su mdico antes de Risk manager medicamentos de [employer].  Consulte con su mdico cunto alcohol y cafena (caf, t, chocolate o bebidas cola) puede consumir.  Es muy importante cumplir con todas las citas de seguimiento con el objeto de Barrister's clerk el problema. SOLICITE ATENCIN MDICA DE INMEDIATO SI:  Tiene mareos.  Tiene desvanecimientos o desmayos.  Tiene falta de aire.  Tiene dolor o presin en el pecho.  Tiene siente nuseas o vmitos.  Tiene sudoracin intensa.  Est preocupado por el tiempo que durarn los sntomas.  Est preocupado por la frecuencia de los episodios de taquicardia supraventricular. Si tiene los [employer], llame al servicio local de Freight forwarder de inmediato (911 en los [place]). No conduzca hasta el hospital por sus propios medios. ASEGRESE QUE:   Comprende esas instrucciones para el alta mdica.  Controlar su enfermedad.  Pedir ayuda de inmediato si no mejora o empeora. Document Released: 11/24/2004 Document Revised: 05/09/2011 [facility] Patient Information 2015 [place]. This information is not intended to replace advice given to you by your health care provider. Make sure you discuss any questions you have with your health care provider.

## 2014-11-02 NOTE — ED Notes (Signed)
[facility]- pt coming from home with raving heart for approximately 3 hours. Pt short of breath and tachy at 170 on EMS arrival. Pt received 6mg  of adenosine and converted to NSR. Pt pain free on arrival.

## 2014-11-02 NOTE — ED Notes (Signed)
No answer when called to recheck vitals

## 2014-11-02 NOTE — ED Provider Notes (Signed)
CSN: [id number]     Arrival date & time 11/02/14  0919 History   First MD Initiated Contact with Patient 11/02/14 [phone]     Chief Complaint  Patient presents with  . Tachycardia     (Consider location/radiation/quality/duration/timing/severity/associated sxs/prior Treatment) Patient is a 59 y.o. female presenting with shortness of breath. The history is provided by the patient.  Shortness of Breath Severity:  Moderate Onset quality:  Sudden Duration:  3 hours Timing:  Constant Progression:  Resolved Chronicity:  Chronic Relieved by: adenosine. Worsened by:  Nothing tried Ineffective treatments:  None tried Associated symptoms: chest pain   Associated symptoms: no fever, no headaches, no vomiting and no wheezing   Risk factors comment:  Hx of svt  59 yo F with a chief complaint of shortness of breath and chest pain. This started about 3 hours ago. Patient states this happens to her about twice a week. Patient has a history of paroxysmal SVT. Seen by cardiology multiple times for this. Patient has a prescription for amiodarone and took some prior to EMS call. En route patient was given 6 mg of adenosine with complete resolution of all symptoms.  Past Medical History  Diagnosis Date  . Hypertension   . Kidney stones   . Depression   . Tachycardia   . Angiomyolipoma of right kidney   . GERD (gastroesophageal reflux disease)   . Type II diabetes mellitus     "took Metformin"; resolved S/P gastric bypass  . Anemia   . Hepatitis     "when I was pregnant; got the one that's not bad"   . Migraine     "q few months" (11/04/2013)  . Arthritis     "hands, feet, back, neck" (11/04/2013)  . DDD (degenerative disc disease)   . Chronic back pain     "neck down into lower back" (11/04/2013)   Past Surgical History  Procedure Laterality Date  . Gastric bypass  2008  . Appendectomy  ~ 1972  . Reduction mammaplasty Bilateral ~ 2010  . Kidney stone surgery  ~ 2004    "cut them out"  .  Esophagogastroduodenoscopy (egd) with propofol N/A 11/28/2013    Procedure: ESOPHAGOGASTRODUODENOSCOPY (EGD) WITH PROPOFOL;  Surgeon: Inda Castle, MD;  Location: [facility];  Service: Endoscopy;  Laterality: N/A;   Family History  Problem Relation Age of Onset  . Diabetes Mother   . Heart disease Father    Social History  Substance Use Topics  . Smoking status: Former Smoker -- 0.25 packs/day for 43 years  . Smokeless tobacco: Never Used  . Alcohol Use: No   OB History    Gravida Para Term Preterm AB TAB SAB Ectopic Multiple Living   5 3 3  2 2    3      Review of Systems  Constitutional: Negative for fever and chills.  HENT: Negative for congestion and rhinorrhea.   Eyes: Negative for redness and visual disturbance.  Respiratory: Positive for shortness of breath. Negative for wheezing.   Cardiovascular: Positive for chest pain and palpitations.  Gastrointestinal: Negative for nausea and vomiting.  Genitourinary: Negative for dysuria and urgency.  Musculoskeletal: Negative for myalgias and arthralgias.  Skin: Negative for pallor and wound.  Neurological: Negative for dizziness and headaches.      Allergies  Review of patient's allergies indicates no known allergies.  Home Medications   Prior to Admission medications   Medication Sig Start Date End Date Taking? Authorizing Provider  amiodarone (PACERONE) 200 MG  tablet Take 200 mg by mouth daily as needed (for heart racing).    Historical Provider, MD  diclofenac (VOLTAREN) 75 MG EC tablet Take 1 tablet (75 mg total) by mouth 2 (two) times daily. Patient not taking: Reported on 07/08/2014 02/03/14   Lorayne Marek, MD  enalapril (VASOTEC) 10 MG tablet Take 10 mg by mouth daily.    Historical Provider, MD  ergocalciferol (VITAMIN D2) 50000 UNITS capsule Take 50,000 Units by mouth once a week. On Monday    Historical Provider, MD  fluticasone (FLONASE) 50 MCG/ACT nasal spray Place 2 sprays into both nostrils daily. Patient  taking differently: Place 2 sprays into both nostrils 2 (two) times daily as needed for rhinitis.  06/18/14   Lorayne Marek, MD  gabapentin (NEURONTIN) 300 MG capsule Take 1 capsule (300 mg total) by mouth 3 (three) times daily. 08/20/14   Lorayne Marek, MD  Hyoscyamine Sulfate 0.375 MG TBCR Take 1 tab bid prn abdominal pain Patient not taking: Reported on 03/30/2014 11/28/13   Inda Castle, MD  meclizine (ANTIVERT) 25 MG tablet Take 1 tablet (25 mg total) by mouth 3 (three) times daily as needed for dizziness. 08/20/14   Lorayne Marek, MD  metoprolol (LOPRESSOR) 50 MG tablet Take 1 tablet (50 mg total) by mouth 2 (two) times daily. Patient taking differently: Take 50 mg by mouth 2 (two) times daily as needed (blood pressure).  06/18/14   Lorayne Marek, MD  oxybutynin (DITROPAN-XL) 5 MG 24 hr tablet Take 1 tablet (5 mg total) by mouth at bedtime. 02/03/14   Lorayne Marek, MD  pantoprazole (PROTONIX) 40 MG tablet Take 1 tablet (40 mg total) by mouth daily. Patient taking differently: Take 40 mg by mouth 2 (two) times daily as needed (acid reflux).  06/18/14   Lorayne Marek, MD  traMADol-acetaminophen (ULTRACET) 37.5-325 MG per tablet Take 1 tablet by mouth every 8 (eight) hours as needed for moderate pain. 08/20/14   Lorayne Marek, MD   BP 118/86 mmHg  Pulse 73  Temp(Src) 98.5 F (36.9 C) (Oral)  Resp 14  SpO2 99% Physical Exam  Constitutional: She is oriented to person, place, and time. She appears well-developed and well-nourished. No distress.  HENT:  Head: Normocephalic and atraumatic.  Eyes: EOM are normal. Pupils are equal, round, and reactive to light.  Neck: Normal range of motion. Neck supple.  Cardiovascular: Normal rate and regular rhythm.  Exam reveals no gallop and no friction rub.   No murmur heard. Pulmonary/Chest: Effort normal. She has no wheezes. She has no rales.  Abdominal: Soft. She exhibits no distension. There is no tenderness. There is no rebound and no guarding.   Musculoskeletal: She exhibits no edema or tenderness.  Neurological: She is alert and oriented to person, place, and time.  Skin: Skin is warm and dry. She is not diaphoretic.  Psychiatric: She has a normal mood and affect. Her behavior is normal.    ED Course  Procedures (including critical care time) Labs Review Labs Reviewed - No data to display  Imaging Review No results found. I have personally reviewed and evaluated these images and lab results as part of my medical decision-making.   EKG Interpretation None      MDM   Final diagnoses:  SVT (supraventricular tachycardia)    59 yo F with history of recurrent SVT comes in with the same. Converted prior to arrival. EKG without concerning signs. Patient continues to be some asymptomatic. Will have her follow-up with her cardiologist for  possible medication change.  9:58 AM:  I have discussed the diagnosis/risks/treatment options with the patient and believe the pt to be eligible for discharge home to follow-up with PCP. We also discussed returning to the ED immediately if new or worsening sx occur. We discussed the sx which are most concerning (e.g., recurrance) that necessitate immediate return. Medications administered to the patient during their visit and any new prescriptions provided to the patient are listed below.  Medications given during this visit Medications - No data to display  New Prescriptions   No medications on file     The patient appears reasonably screen and/or stabilized for discharge and I doubt any other medical condition or other [facility] requiring further screening, evaluation, or treatment in the ED at this time prior to discharge.      Deno Etienne, DO 11/02/14 [phone]

## 2014-11-02 NOTE — ED Notes (Signed)
Called for Pt when Room ready. No Answer.

## 2014-12-08 ENCOUNTER — Other Ambulatory Visit (HOSPITAL_COMMUNITY)
Admission: RE | Admit: 2014-12-08 | Discharge: 2014-12-08 | Disposition: A | Discharge status: Home / Self Care | Payer: Medicare Other | Source: Ambulatory Visit | Attending: Family Medicine | Admitting: Family Medicine

## 2014-12-08 ENCOUNTER — Encounter: Payer: Self-pay | Admitting: Family Medicine

## 2014-12-08 ENCOUNTER — Ambulatory Visit: Payer: Medicare Other | Attending: Family Medicine | Admitting: Family Medicine

## 2014-12-08 ENCOUNTER — Other Ambulatory Visit: Payer: Self-pay | Admitting: Internal Medicine

## 2014-12-08 VITALS — BP 103/71 | HR 67 | Temp 98.3°F | Resp 16 | Ht 67.0 in | Wt 222.0 lb

## 2014-12-08 DIAGNOSIS — Z87891 Personal history of nicotine dependence: Secondary | ICD-10-CM | POA: Diagnosis not present

## 2014-12-08 DIAGNOSIS — N760 Acute vaginitis: Secondary | ICD-10-CM | POA: Diagnosis not present

## 2014-12-08 DIAGNOSIS — Z79899 Other long term (current) drug therapy: Secondary | ICD-10-CM | POA: Diagnosis not present

## 2014-12-08 DIAGNOSIS — R32 Unspecified urinary incontinence: Secondary | ICD-10-CM

## 2014-12-08 DIAGNOSIS — Z1151 Encounter for screening for human papillomavirus (HPV): Secondary | ICD-10-CM | POA: Insufficient documentation

## 2014-12-08 DIAGNOSIS — F329 Major depressive disorder, single episode, unspecified: Secondary | ICD-10-CM | POA: Diagnosis not present

## 2014-12-08 DIAGNOSIS — Z6834 Body mass index (BMI) 34.0-34.9, adult: Secondary | ICD-10-CM | POA: Insufficient documentation

## 2014-12-08 DIAGNOSIS — Z124 Encounter for screening for malignant neoplasm of cervix: Secondary | ICD-10-CM | POA: Diagnosis not present

## 2014-12-08 DIAGNOSIS — E669 Obesity, unspecified: Secondary | ICD-10-CM | POA: Insufficient documentation

## 2014-12-08 DIAGNOSIS — Z113 Encounter for screening for infections with a predominantly sexual mode of transmission: Secondary | ICD-10-CM | POA: Diagnosis not present

## 2014-12-08 DIAGNOSIS — G894 Chronic pain syndrome: Secondary | ICD-10-CM

## 2014-12-08 DIAGNOSIS — M549 Dorsalgia, unspecified: Secondary | ICD-10-CM | POA: Diagnosis not present

## 2014-12-08 DIAGNOSIS — Z01419 Encounter for gynecological examination (general) (routine) without abnormal findings: Secondary | ICD-10-CM | POA: Insufficient documentation

## 2014-12-08 DIAGNOSIS — F419 Anxiety disorder, unspecified: Secondary | ICD-10-CM | POA: Insufficient documentation

## 2014-12-08 DIAGNOSIS — R102 Pelvic and perineal pain: Secondary | ICD-10-CM | POA: Diagnosis not present

## 2014-12-08 DIAGNOSIS — E66811 Obesity, class 1: Secondary | ICD-10-CM

## 2014-12-08 LAB — POCT URINALYSIS DIPSTICK
Bilirubin, UA: NEGATIVE
Blood, UA: NEGATIVE
Glucose, UA: NEGATIVE
LEUKOCYTES UA: NEGATIVE
Nitrite, UA: NEGATIVE
Protein, UA: NEGATIVE
Spec Grav, UA: >=1.03
Urobilinogen, UA: 0.2
pH, UA: 5

## 2014-12-08 MED ORDER — OXYBUTYNIN CHLORIDE ER 10 MG PO TB24: 10.0000 mg | ORAL_TABLET | Freq: Every day | ORAL | Status: AC

## 2014-12-08 MED ORDER — MELOXICAM 7.5 MG PO TABS: 7.5000 mg | ORAL_TABLET | Freq: Every day | ORAL | Status: DC

## 2014-12-08 MED ORDER — TRAMADOL-ACETAMINOPHEN 37.5-325 MG PO TABS: 1.0000 | ORAL_TABLET | Freq: Three times a day (TID) | ORAL | Status: DC | PRN

## 2014-12-08 NOTE — Progress Notes (Signed)
Patient ID: Shannon Andrade, female   DOB: 1955/10/06, 59 y.o.   MRN: [id number]   Subjective:  Patient ID: Shannon Andrade, female    DOB: July 31, 1965  Age: 59 y.o. MRN: [id number] Spanish interpreter phone used  CC: Urinary Incontinence; Back Pain; Anxiety; and Depression   HPI Shannon Andrade presents for   1. Urinary incontinence: present for many years with urgency and leaking with coughing. She has had 3 large vaginal deliveries. Denies dysuria. Ditropan XL 5 mg helped initially, but is no longer helping.   2. Pelvic pain: x 2 weeks. L sided. No uterine bleeding or discharge. No diarrhea or constipation. No fever or chills.   3. Chronic pains: in mid back and mid chest. mobic and ultracet help. Requesting referral to "spine specialist". Had a fall in shower 2 years ago.   Social History  Substance Use Topics  . Smoking status: Former Smoker -- 0.50 packs/day for 43 years    Types: Cigarettes  . Smokeless tobacco: Never Used  . Alcohol Use: 50.4 oz/week    84 Cans of beer per week     Comment: Pt drinks a 12 pack a day    Outpatient Prescriptions Prior to Visit  Medication Sig Dispense Refill  . enalapril (VASOTEC) 10 MG tablet Take 10 mg by mouth daily.    . fluticasone (FLONASE) 50 MCG/ACT nasal spray Place 2 sprays into both nostrils daily. 16 g 3  . gabapentin (NEURONTIN) 300 MG capsule Take 1 capsule (300 mg total) by mouth 3 (three) times daily. 90 capsule 3  . Hyoscyamine Sulfate 0.375 MG TBCR Take 1 tab bid prn abdominal pain 60 each 1  . meclizine (ANTIVERT) 25 MG tablet Take 1 tablet (25 mg total) by mouth 3 (three) times daily as needed for dizziness. 90 tablet 2  . metoprolol (LOPRESSOR) 50 MG tablet Take 1 tablet (50 mg total) by mouth 2 (two) times daily. (Patient taking differently: Take 50 mg by mouth 2 (two) times daily as needed (blood pressure). ) 60 tablet 2  . oxybutynin (DITROPAN-XL) 5 MG 24 hr tablet Take 1 tablet (5 mg total) by mouth at  bedtime. 30 tablet 3  . pantoprazole (PROTONIX) 40 MG tablet Take 1 tablet (40 mg total) by mouth daily. (Patient taking differently: Take 40 mg by mouth 2 (two) times daily as needed (acid reflux). ) 30 tablet 3  . traMADol-acetaminophen (ULTRACET) 37.5-325 MG per tablet Take 1 tablet by mouth every 8 (eight) hours as needed for moderate pain. 60 tablet 0  . amiodarone (PACERONE) 200 MG tablet Take 200 mg by mouth daily as needed (for heart racing).    Marland Kitchen diclofenac (VOLTAREN) 75 MG EC tablet Take 1 tablet (75 mg total) by mouth 2 (two) times daily. (Patient not taking: Reported on 07/08/2014) 60 tablet 3  . ergocalciferol (VITAMIN D2) 50000 UNITS capsule Take 50,000 Units by mouth once a week. On Monday     No facility-administered medications prior to visit.    ROS Review of Systems  Constitutional: Negative for fever and chills.  Eyes: Negative for visual disturbance.  Respiratory: Negative for shortness of breath.   Cardiovascular: Positive for chest pain.  Gastrointestinal: Negative for abdominal pain and blood in stool.  Genitourinary: Positive for pelvic pain.  Musculoskeletal: Negative for back pain and arthralgias.  Skin: Negative for rash.  Allergic/Immunologic: Negative for immunocompromised state.  Hematological: Negative for adenopathy. Does not bruise/bleed easily.  Psychiatric/Behavioral: Positive for dysphoric mood. Negative for suicidal  ideas. The patient is nervous/anxious.   GAD-7: score of 21. 3s down the line.   Objective:  BP 103/71 mmHg  Pulse 67  Temp(Src) 98.3 F (36.8 C) (Oral)  Resp 16  Ht 5\' 7"  (1.702 m)  Wt 222 lb (100.699 kg)  BMI 34.76 kg/m2  SpO2 98%  BP/Weight 12/08/2014 10/29/4780 11/03/6211  Systolic BP 086 578 469  Diastolic BP 71 84 79  Wt. (Lbs) 222 - -  BMI 34.76 - -   Physical Exam  Constitutional: She is oriented to person, place, and time. She appears well-developed and well-nourished. No distress.  HENT:  Head: Normocephalic and  atraumatic.  Cardiovascular: Normal rate, regular rhythm, normal heart sounds and intact distal pulses.   Pulmonary/Chest: Effort normal and breath sounds normal.  Abdominal: Soft. Bowel sounds are normal. She exhibits no distension and no mass. There is tenderness (LLQ tenderness ). There is no rebound and no guarding.  Genitourinary: Vagina normal. Pelvic exam was performed with patient prone. There is no rash, tenderness or lesion on the right labia. There is no rash, tenderness or lesion on the left labia. Uterus is tender. Uterus is not deviated, not enlarged and not fixed. Cervix exhibits motion tenderness and friability. Cervix exhibits no discharge.  Musculoskeletal: She exhibits no edema.  Lymphadenopathy:       Right: No inguinal adenopathy present.       Left: No inguinal adenopathy present.  Neurological: She is alert and oriented to person, place, and time.  Skin: Skin is warm and dry. No rash noted.  Psychiatric: She has a normal mood and affect.   UA: wnl   Assessment & Plan:   Problem List Items Addressed This Visit    Chronic pain syndrome (Chronic)   Relevant Medications   traMADol-acetaminophen (ULTRACET) 37.5-325 MG tablet   meloxicam (MOBIC) 7.5 MG tablet   Other Relevant Orders   Ambulatory referral to Pain Clinic   Obesity (BMI 30.0-34.9) (Chronic)   Pelvic pain in female (Chronic)   Relevant Orders   US Pelvis Complete   US Transvaginal Non-OB   Urinary incontinence - Primary (Chronic)   Relevant Medications   oxybutynin (DITROPAN-XL) 10 MG 24 hr tablet   Other Relevant Orders   POCT urinalysis dipstick (Completed)    Other Visit Diagnoses    Pap smear for cervical cancer screening        Relevant Orders    Cytology - PAP [place]       No orders of the defined types were placed in this encounter.    Follow-up: No Follow-up on file.   Boykin Nearing MD

## 2014-12-08 NOTE — Progress Notes (Signed)
Requesting GYN referral Vaginal pain unable to contain urine  Stated back pain due to injury

## 2014-12-08 NOTE — Patient Instructions (Addendum)
Shannon Andrade was seen today for urinary incontinence, back pain, anxiety and depression.  Diagnoses and all orders for this visit:  Urinary incontinence, unspecified incontinence type -     POCT urinalysis dipstick -     oxybutynin (DITROPAN-XL) 10 MG 24 hr tablet; Take 1 tablet (10 mg total) by mouth at bedtime.  Pap smear for cervical cancer screening -     Cytology - PAP [place]  Pelvic pain in female -     US Pelvis Complete; Future -     US Transvaginal Non-OB; Future  Chronic pain syndrome -     traMADol-acetaminophen (ULTRACET) 37.5-325 MG tablet; Take 1 tablet by mouth every 8 (eight) hours as needed for moderate pain. -     meloxicam (MOBIC) 7.5 MG tablet; Take 1 tablet (7.5 mg total) by mouth daily.   F/u in 3-4 weeks to discuss anxiety and depression in more detail F/u in 6 week for urinary incontinence and pelvic pain   Dr. Adrian Blackwater

## 2014-12-09 LAB — CERVICOVAGINAL ANCILLARY ONLY
Chlamydia: NEGATIVE
Neisseria Gonorrhea: NEGATIVE
TRICH (WINDOWPATH): NEGATIVE
WET PREP (BD AFFIRM): NEGATIVE

## 2014-12-09 LAB — CYTOLOGY - PAP

## 2014-12-10 ENCOUNTER — Other Ambulatory Visit: Payer: Self-pay | Admitting: Obstetrics and Gynecology

## 2014-12-10 ENCOUNTER — Telehealth: Payer: Self-pay | Admitting: *Deleted

## 2014-12-10 DIAGNOSIS — Z1231 Encounter for screening mammogram for malignant neoplasm of breast: Secondary | ICD-10-CM

## 2014-12-10 NOTE — Telephone Encounter (Signed)
[place] schedule on 10/21.2016 at 08:30 arriving 15 min early at [facility]  With Full bladder    Unable to contact pt Left  Appointment information with pt Son Toya Smothers. (DPR SCANNED 11/13/13-GI- OK TO TALK TO SON- EDIBERTO CABRERA)

## 2014-12-19 ENCOUNTER — Ambulatory Visit (HOSPITAL_COMMUNITY): Admission: RE | Admit: 2014-12-19 | Payer: Medicare Other | Source: Ambulatory Visit

## 2014-12-19 ENCOUNTER — Ambulatory Visit (HOSPITAL_COMMUNITY): Payer: Medicare Other

## 2014-12-24 ENCOUNTER — Telehealth: Payer: Self-pay | Admitting: *Deleted

## 2014-12-24 NOTE — Telephone Encounter (Signed)
-----   Message from Boykin Nearing, MD sent at 12/09/2014  9:15 AM EDT ----- Negative std screen

## 2014-12-24 NOTE — Telephone Encounter (Signed)
-----   Message from Boykin Nearing, MD sent at 12/11/2014  8:36 AM EDT ----- Negative pap Repeat in 5 years

## 2014-12-24 NOTE — Telephone Encounter (Signed)
LVM to return call.

## 2015-01-15 ENCOUNTER — Ambulatory Visit (HOSPITAL_COMMUNITY): Payer: Medicare Other

## 2015-03-05 ENCOUNTER — Other Ambulatory Visit: Payer: Self-pay | Admitting: Internal Medicine

## 2015-03-05 ENCOUNTER — Telehealth: Payer: Self-pay | Admitting: Family Medicine

## 2015-03-05 MED FILL — PANTOPRAZOLE SOD DR 40 MG T: 40 | 30 days supply | Qty: 30 | Fill #0

## 2015-03-05 MED FILL — FLUTICASONE PROP 50 MCG SPR: 50 | 30 days supply | Qty: 16 | Fill #3

## 2015-03-05 MED FILL — METOPROLOL TARTRATE 50 MG T: 50 | 30 days supply | Qty: 60 | Fill #0

## 2015-03-05 MED FILL — OXYBUTYNIN CL ER 10 MG TAB: 10 | 30 days supply | Qty: 30 | Fill #0

## 2015-03-05 MED FILL — MELOXICAM 7.5 MG TABLET: 7.5 | 30 days supply | Qty: 30 | Fill #0

## 2015-03-05 NOTE — Telephone Encounter (Signed)
Pt. Called requesting a refill for Tramadol. Pt. Stated she is going out of town tomorrow. Please f/u with pt.

## 2015-03-06 ENCOUNTER — Telehealth: Payer: Self-pay | Admitting: Family Medicine

## 2015-03-06 DIAGNOSIS — G894 Chronic pain syndrome: Secondary | ICD-10-CM

## 2015-03-06 MED ORDER — TRAMADOL-ACETAMINOPHEN 37.5-325 MG PO TABS
1.0000 | ORAL_TABLET | Freq: Three times a day (TID) | ORAL | Status: DC | PRN
Start: 2015-03-06 — End: 2017-06-28

## 2015-03-06 NOTE — Telephone Encounter (Signed)
Tramadol ready for pick up  

## 2015-03-10 NOTE — Telephone Encounter (Signed)
Unable to contact pt   Rx tramadol at front office

## 2015-03-11 NOTE — Telephone Encounter (Signed)
Unable to contact pt x2  LVM to emergency phone number to return call  Rx Tramadol at front office

## 2015-04-07 ENCOUNTER — Other Ambulatory Visit: Payer: Self-pay | Admitting: Internal Medicine

## 2015-04-07 MED FILL — OXYBUTYNIN CL ER 10 MG TAB: 10 | 30 days supply | Qty: 30 | Fill #1

## 2015-04-07 MED FILL — TRAVEL SICKNESS 25 MG TAB C: 25 | 10 days supply | Qty: 30 | Fill #1

## 2015-04-07 MED FILL — GABAPENTIN 300 MG CAPSULE: 300 | 30 days supply | Qty: 90 | Fill #1

## 2015-04-07 MED FILL — MELOXICAM 7.5 MG TABLET: 7.5 | 30 days supply | Qty: 30 | Fill #1

## 2015-04-07 MED FILL — PANTOPRAZOLE SOD DR 40 MG T: 40 | 30 days supply | Qty: 30 | Fill #1

## 2015-04-07 MED FILL — TRAMADOL-APAP 37.5-325 TAB: 37.5-325 | 20 days supply | Qty: 60 | Fill #0

## 2015-04-23 ENCOUNTER — Ambulatory Visit: Payer: Medicare Other | Admitting: Family Medicine

## 2015-05-04 MED FILL — METOPROLOL TARTRATE 50 MG T: 50 | 30 days supply | Qty: 60 | Fill #1

## 2015-05-04 MED FILL — OXYBUTYNIN CL ER 10 MG TAB: 10 | 30 days supply | Qty: 30 | Fill #2

## 2015-05-04 MED FILL — GABAPENTIN 300 MG CAPSULE: 300 | 30 days supply | Qty: 90 | Fill #2

## 2015-05-04 MED FILL — PANTOPRAZOLE SOD DR 40 MG T: 40 | 30 days supply | Qty: 30 | Fill #2

## 2015-05-05 MED FILL — FLUTICASONE PROP 50 MCG SPR: 50 | 30 days supply | Qty: 16 | Fill #0

## 2015-05-28 MED FILL — PANTOPRAZOLE SOD DR 40 MG T: 40 | 30 days supply | Qty: 30 | Fill #3

## 2015-05-28 MED FILL — GABAPENTIN 300 MG CAPSULE: 300 | 30 days supply | Qty: 90 | Fill #3

## 2015-05-28 MED FILL — OXYBUTYNIN CL ER 10 MG TAB: 10 | 30 days supply | Qty: 30 | Fill #3

## 2015-05-28 MED FILL — FLUTICASONE PROP 50 MCG SPR: 50 | 30 days supply | Qty: 16 | Fill #1

## 2015-05-28 MED FILL — METOPROLOL TARTRATE 50 MG T: 50 | 30 days supply | Qty: 60 | Fill #2

## 2015-10-06 DIAGNOSIS — M25562 Pain in left knee: Secondary | ICD-10-CM | POA: Diagnosis not present

## 2015-10-06 DIAGNOSIS — M25561 Pain in right knee: Secondary | ICD-10-CM | POA: Diagnosis not present

## 2016-04-25 DIAGNOSIS — M25461 Effusion, right knee: Secondary | ICD-10-CM | POA: Diagnosis not present

## 2016-04-25 DIAGNOSIS — M170 Bilateral primary osteoarthritis of knee: Secondary | ICD-10-CM | POA: Diagnosis not present

## 2016-08-15 IMAGING — CR DG CHEST 1V PORT
1 series · 1 of 1 positions shown · non-contrast
Comparison: 03/30/2014.

CLINICAL DATA: 58-year-old female with chest pain since this
morning. History of high blood pressure and smoking. Initial
encounter.

EXAM:
PORTABLE CHEST - 1 VIEW

[AP]
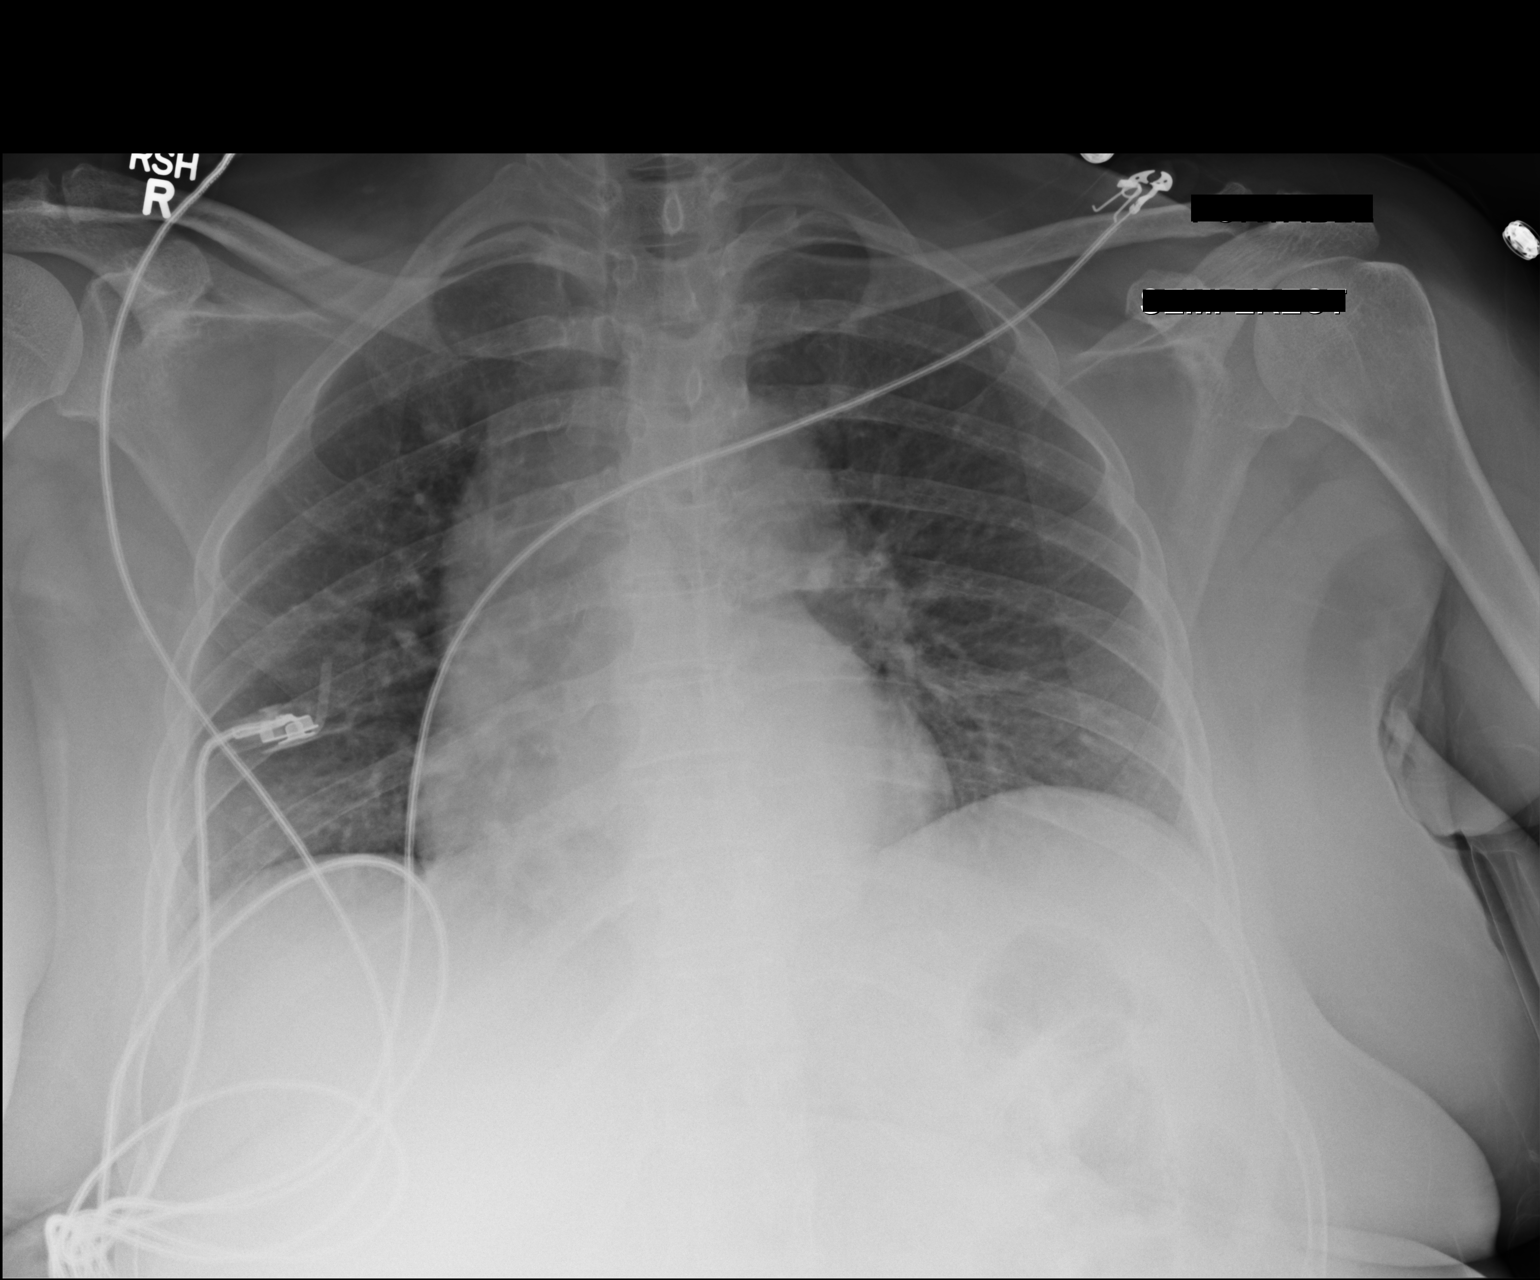

[1 of 1 positions shown; findings below may reference images not displayed]

FINDINGS: Prominence of the thoracic aorta may be related to the AP
magnification and poor inspiration however if there is any clinical
suspicion of thoracic aortic abnormality, CT imaging may be
considered for further delineation.

Slight elevation left hemidiaphragm.

Mild central pulmonary vascular prominence without pulmonary edema.

No segmental consolidation or gross pneumothorax.

Heart size top-normal.
IMPRESSION: Prominence of the thoracic aorta may be related to the AP
magnification and poor inspiration however if there is any clinical
suspicion of thoracic aortic abnormality, CT imaging may be
considered for further delineation.

Mild central pulmonary vascular prominence without pulmonary edema.

No segmental consolidation or gross pneumothorax.

## 2017-04-04 ENCOUNTER — Other Ambulatory Visit: Payer: Self-pay

## 2017-04-04 ENCOUNTER — Emergency Department (HOSPITAL_BASED_OUTPATIENT_CLINIC_OR_DEPARTMENT_OTHER)
Admission: EM | Admit: 2017-04-04 | Discharge: 2017-04-04 | Disposition: A | Discharge status: Home / Self Care | Payer: Medicare (Managed Care) | Attending: Emergency Medicine | Admitting: Emergency Medicine

## 2017-04-04 ENCOUNTER — Encounter (HOSPITAL_BASED_OUTPATIENT_CLINIC_OR_DEPARTMENT_OTHER): Payer: Self-pay | Admitting: *Deleted

## 2017-04-04 ENCOUNTER — Emergency Department (HOSPITAL_BASED_OUTPATIENT_CLINIC_OR_DEPARTMENT_OTHER): Payer: Medicare (Managed Care)

## 2017-04-04 DIAGNOSIS — R079 Chest pain, unspecified: Secondary | ICD-10-CM

## 2017-04-04 DIAGNOSIS — K8020 Calculus of gallbladder without cholecystitis without obstruction: Secondary | ICD-10-CM | POA: Insufficient documentation

## 2017-04-04 DIAGNOSIS — R0789 Other chest pain: Secondary | ICD-10-CM | POA: Diagnosis not present

## 2017-04-04 DIAGNOSIS — E119 Type 2 diabetes mellitus without complications: Secondary | ICD-10-CM | POA: Insufficient documentation

## 2017-04-04 DIAGNOSIS — I10 Essential (primary) hypertension: Secondary | ICD-10-CM | POA: Insufficient documentation

## 2017-04-04 DIAGNOSIS — Z79899 Other long term (current) drug therapy: Secondary | ICD-10-CM | POA: Diagnosis not present

## 2017-04-04 DIAGNOSIS — K219 Gastro-esophageal reflux disease without esophagitis: Secondary | ICD-10-CM

## 2017-04-04 DIAGNOSIS — R1011 Right upper quadrant pain: Secondary | ICD-10-CM | POA: Diagnosis not present

## 2017-04-04 DIAGNOSIS — F17210 Nicotine dependence, cigarettes, uncomplicated: Secondary | ICD-10-CM | POA: Insufficient documentation

## 2017-04-04 LAB — COMPREHENSIVE METABOLIC PANEL
ALT: 17 U/L (ref 14–54)
AST: 26 U/L (ref 15–41)
Albumin: 3.8 g/dL (ref 3.5–5.0)
Alkaline Phosphatase: 88 U/L (ref 38–126)
Anion gap: 8 (ref 5–15)
BUN: 10 mg/dL (ref 6–20)
CALCIUM: 8.9 mg/dL (ref 8.9–10.3)
CHLORIDE: 106 mmol/L (ref 101–111)
CO2: 27 mmol/L (ref 22–32)
CREATININE: 0.56 mg/dL (ref 0.44–1.00)
GFR calc Af Amer: >60 mL/min (ref >60)
GFR calc non Af Amer: >60 mL/min (ref >60)
Glucose, Bld: 114 mg/dL — ABNORMAL HIGH (ref 65–99)
Potassium: 4.2 mmol/L (ref 3.5–5.1)
Sodium: 141 mmol/L (ref 135–145)
Total Bilirubin: 0.7 mg/dL (ref 0.3–1.2)
Total Protein: 6.9 g/dL (ref 6.5–8.1)

## 2017-04-04 LAB — D-DIMER, QUANTITATIVE: D-Dimer, Quant: 0.32 ug/mL-FEU (ref 0.00–0.50)

## 2017-04-04 LAB — CBG MONITORING, ED: GLUCOSE-CAPILLARY: 112 mg/dL — AB (ref 65–99)

## 2017-04-04 LAB — CBC
HCT: 43.9 % (ref 36.0–46.0)
Hemoglobin: 14.5 g/dL (ref 12.0–15.0)
MCH: 31.9 pg (ref 26.0–34.0)
MCHC: 33 g/dL (ref 30.0–36.0)
MCV: 96.7 fL (ref 78.0–100.0)
PLATELETS: 262 10*3/uL (ref 150–400)
RBC: 4.54 MIL/uL (ref 3.87–5.11)
RDW: 12.1 % (ref 11.5–15.5)
WBC: 7.9 10*3/uL (ref 4.0–10.5)

## 2017-04-04 LAB — TROPONIN I
Troponin I: <0.03 ng/mL (ref <0.03)
Troponin I: <0.03 ng/mL (ref <0.03)

## 2017-04-04 LAB — LIPASE, BLOOD: LIPASE: 39 U/L (ref 11–51)

## 2017-04-04 MED ORDER — SUCRALFATE 1 GM/10ML PO SUSP: 1.0000 g | Freq: Three times a day (TID) | ORAL | 0 refills | Status: DC

## 2017-04-04 MED ORDER — KETOROLAC TROMETHAMINE 30 MG/ML IJ SOLN
15.0000 mg | Freq: Once | INTRAMUSCULAR | Status: AC
  Administered 2017-04-04: 15 mg via INTRAVENOUS
  Filled 2017-04-04: qty 1

## 2017-04-04 MED ORDER — PANTOPRAZOLE SODIUM 40 MG IV SOLR
40.0000 mg | Freq: Once | INTRAVENOUS | Status: DC
Start: 2017-04-04 — End: 2017-04-04

## 2017-04-04 MED ORDER — GI COCKTAIL ~~LOC~~
30.0000 mL | Freq: Once | ORAL | Status: AC
Start: 2017-04-04 — End: 2017-04-04
  Administered 2017-04-04: 30 mL via ORAL
  Filled 2017-04-04: qty 30

## 2017-04-04 MED ORDER — ASPIRIN 81 MG PO CHEW
324.0000 mg | CHEWABLE_TABLET | Freq: Once | ORAL | Status: AC
  Administered 2017-04-04: 324 mg via ORAL
  Filled 2017-04-04: qty 4

## 2017-04-04 MED ORDER — PANTOPRAZOLE SODIUM 40 MG PO TBEC
40.0000 mg | DELAYED_RELEASE_TABLET | Freq: Once | ORAL | Status: AC
  Administered 2017-04-04: 40 mg via ORAL
  Filled 2017-04-04: qty 1

## 2017-04-04 MED ORDER — PANTOPRAZOLE SODIUM 40 MG PO TBEC: 40.0000 mg | DELAYED_RELEASE_TABLET | Freq: Every day | ORAL | 3 refills | Status: DC

## 2017-04-04 MED FILL — CARAFATE 1 GM/10 ML SUSP: 1 | 11 days supply | Qty: 420 | Fill #0

## 2017-04-04 MED FILL — PANTOPRAZOLE SOD DR 40 MG T: 40 | 30 days supply | Qty: 30 | Fill #0

## 2017-04-04 NOTE — ED Notes (Signed)
Patient transported to Ultrasound 

## 2017-04-04 NOTE — ED Notes (Signed)
ED Provider at bedside. 

## 2017-04-04 NOTE — ED Notes (Signed)
Patient walked to restroom with no distress noted.

## 2017-04-04 NOTE — ED Triage Notes (Signed)
Chest pain x 2-3 days "burning". Pain goes to back and both shoulders

## 2017-04-04 NOTE — ED Notes (Signed)
Pt verbalizes understanding of d/c instructions and denies any further needs at this time. 

## 2017-04-04 NOTE — ED Notes (Signed)
Patient hooked back up to cardiac monitor with Vitals set to Q 30 mins.

## 2017-04-04 NOTE — ED Provider Notes (Signed)
[place] EMERGENCY DEPARTMENT Provider Note   CSN: [id number] Arrival date & time: 04/04/17  1022     History   Chief Complaint Chief Complaint  Patient presents with  . Chest Pain    HPI Shannon Andrade is a 62 y.o. female.  HPI 62 year old Hispanic female with past medical history significant for chronic pain, DJD, GERD, hypertension, gastric bypass that presents to the emergency department today for evaluation of chest pain.  Patient reports 4 days of constant anterior chest pain that radiates to her back.  Describes the pain as a burning sensation.  Denies any associated diaphoresis, nausea, emesis.  Does report some mild shortness of breath with deep inspiration.  The pain is constant.  It is not resolved by any over-the-counter medications.  Patient has tried taking tramadol and Tylenol with some relief of her symptoms.  Patient states that the chest pain is not exertional.  Does report some pleuritic chest pain.  She also states that the pain is under her right breast.  Has known cholelithiasis.  Patient states that eating makes the pain worse.  Describes the pain is 7 out of 10.  Patient reports tobacco use.  Also reports history of hypertension.  Patient does report episode of emesis last week that resolved prior to the chest pain starting.  Patient denies any recent surgeries/prolonged immobilization, lower extremity edema or calf tenderness, history of DVT/PE, hormone use, hemoptysis, cancer. Pt feels like her pain is in her lungs.   Pt denies any fever, chill, ha, vision changes, lightheadedness, dizziness, congestion, neck pain,  cough, abd pain, n/v/d, urinary symptoms, change in bowel habits, melena, hematochezia, lower extremity paresthesias.    Past Medical History:  Diagnosis Date  . Anemia   . Angiomyolipoma of right kidney   . Arthritis    "hands, feet, back, neck" (11/04/2013)  . Chronic back pain    "neck down into lower back" (11/04/2013)  . DDD  (degenerative disc disease)   . Depression   . GERD (gastroesophageal reflux disease)   . Hepatitis    "when I was pregnant; got the one that's not bad"   . Hypertension   . Kidney stones   . Migraine    "q few months" (11/04/2013)  . Paroxysmal SVT (supraventricular tachycardia) ([place])   . Tachycardia   . Type II diabetes mellitus ([place])    "took Metformin"; resolved S/P gastric bypass    Patient Active Problem List   Diagnosis Date Noted  . Urinary incontinence 12/08/2014  . Pelvic pain in female 12/08/2014  . Obesity (BMI 30.0-34.9) 12/08/2014  . Musculoskeletal chest pain 12/30/2013  . Dysphagia, pharyngoesophageal phase 11/28/2013  . Chronic pain syndrome 07/10/2013  . History of cardiac arrhythmia 07/10/2013  . Essential hypertension, benign 07/10/2013  . Unspecified vitamin D deficiency 07/10/2013  . Palpitations 07/10/2013    Past Surgical History:  Procedure Laterality Date  . APPENDECTOMY  ~ 1972  . ESOPHAGOGASTRODUODENOSCOPY (EGD) WITH PROPOFOL N/A 11/28/2013   Procedure: ESOPHAGOGASTRODUODENOSCOPY (EGD) WITH PROPOFOL;  Surgeon: Inda Castle, MD;  Location: [facility] ENDOSCOPY;  Service: Endoscopy;  Laterality: N/A;  . GASTRIC BYPASS  2008  . KIDNEY STONE SURGERY  ~ 2004   "cut them out"  . REDUCTION MAMMAPLASTY Bilateral ~ 2010    OB History    Gravida Para Term Preterm AB Living   5 3 3   2 3    SAB TAB Ectopic Multiple Live Births     2  Home Medications    Prior to Admission medications   Medication Sig Start Date End Date Taking? Authorizing Provider  enalapril (VASOTEC) 10 MG tablet Take 10 mg by mouth daily.    [provider]  fluticasone (FLONASE) 50 MCG/ACT nasal spray PLACE 2 SPRAYS INTO BOTH NOSTRILS DAILY. 04/07/15   Funches, Adriana Mccallum, MD  gabapentin (NEURONTIN) 300 MG capsule Take 1 capsule (300 mg total) by mouth 3 (three) times daily. 08/20/14   Lorayne Marek, MD  Hyoscyamine Sulfate 0.375 MG TBCR Take 1 tab bid prn abdominal  pain 11/28/13   Inda Castle, MD  meclizine (ANTIVERT) 25 MG tablet Take 1 tablet (25 mg total) by mouth 3 (three) times daily as needed for dizziness. 08/20/14   Lorayne Marek, MD  meloxicam (MOBIC) 7.5 MG tablet Take 1 tablet (7.5 mg total) by mouth daily. 12/08/14   Funches, Adriana Mccallum, MD  metoprolol (LOPRESSOR) 50 MG tablet TAKE 1 TABLET BY MOUTH 2 TIMES DAILY 03/05/15   Funches, Adriana Mccallum, MD  oxybutynin (DITROPAN-XL) 10 MG 24 hr tablet Take 1 tablet (10 mg total) by mouth at bedtime. 12/08/14   Funches, Adriana Mccallum, MD  pantoprazole (PROTONIX) 40 MG tablet Take 1 tablet (40 mg total) by mouth daily. Patient taking differently: Take 40 mg by mouth 2 (two) times daily as needed (acid reflux).  06/18/14   Lorayne Marek, MD  traMADol-acetaminophen (ULTRACET) 37.5-325 MG tablet Take 1 tablet by mouth every 8 (eight) hours as needed for moderate pain. 03/06/15   Boykin Nearing, MD    Family History Family History  Problem Relation Age of Onset  . Diabetes Mother   . Heart disease Father     Social History Social History   Tobacco Use  . Smoking status: Current Some Day Smoker    Packs/day: 0.50    Years: 43.00    Pack years: 21.50    Types: Cigarettes  . Smokeless tobacco: Never Used  Substance Use Topics  . Alcohol use: Yes    Alcohol/week: 50.4 oz    Types: 84 Cans of beer per week    Comment: occasional  . Drug use: No     Allergies   Patient has no known allergies.   Review of Systems Review of Systems  Constitutional: Negative for chills, diaphoresis and fever.  HENT: Negative for congestion.   Eyes: Negative for visual disturbance.  Respiratory: Positive for shortness of breath. Negative for cough.   Cardiovascular: Positive for chest pain. Negative for palpitations and leg swelling.  Gastrointestinal: Negative for abdominal pain, diarrhea, nausea and vomiting.  Genitourinary: Negative for dysuria, flank pain, frequency, hematuria, urgency, vaginal bleeding and vaginal  discharge.  Musculoskeletal: Negative for arthralgias and myalgias.  Skin: Negative for rash.  Neurological: Negative for dizziness, syncope, weakness, light-headedness, numbness and headaches.  Psychiatric/Behavioral: Negative for sleep disturbance. The patient is not nervous/anxious.      Physical Exam Updated Vital Signs BP (!) 155/95   Pulse 63   Temp 98.2 F (36.8 C) (Oral)   Resp 18   Ht 5\' 7"  (1.702 m)   Wt 99.8 kg (220 lb)   SpO2 99%   BMI 34.46 kg/m   Physical Exam  Constitutional: She is oriented to person, place, and time. She appears well-developed and well-nourished.  Non-toxic appearance. No distress.  HENT:  Head: Normocephalic and atraumatic.  Nose: Nose normal.  Mouth/Throat: Oropharynx is clear and moist.  Eyes: Conjunctivae are normal. Pupils are equal, round, and reactive to light. Right eye exhibits no discharge.  Left eye exhibits no discharge.  Neck: Normal range of motion. Neck supple. No JVD present. No tracheal deviation present.  Cardiovascular: Normal rate, regular rhythm, normal heart sounds and intact distal pulses. Exam reveals no gallop and no friction rub.  No murmur heard. Pulmonary/Chest: Effort normal and breath sounds normal. No stridor. No respiratory distress. She has no rales. She exhibits no tenderness.  No hypoxia or tachypnea.  Abdominal: Soft. Bowel sounds are normal. She exhibits no distension. There is tenderness in the right upper quadrant. There is no rigidity, no rebound, no guarding, no CVA tenderness, no tenderness at McBurney's point and negative Murphy's sign.  Musculoskeletal: Normal range of motion.  No lower extremity edema or calf tenderness.  Lymphadenopathy:    She has no cervical adenopathy.  Neurological: She is alert and oriented to person, place, and time.  Skin: Skin is warm and dry. Capillary refill takes less than 2 seconds. She is not diaphoretic.  Psychiatric: Her behavior is normal. Judgment and thought  content normal.  Nursing note and vitals reviewed.    ED Treatments / Results  Labs (all labs ordered are listed, but only abnormal results are displayed) Labs Reviewed  COMPREHENSIVE METABOLIC PANEL - Abnormal; Notable for the following components:      Result Value   Glucose, Bld 114 (*)    All other components within normal limits  CBG MONITORING, ED - Abnormal; Notable for the following components:   Glucose-Capillary 112 (*)    All other components within normal limits  TROPONIN I  CBC  LIPASE, BLOOD  TROPONIN I  I-STAT CHEM 8, ED    EKG  EKG Interpretation  Date/Time:  Tuesday April 04 2017 10:28:12 EST Ventricular Rate:  72 PR Interval:  140 QRS Duration: 90 QT Interval:  400 QTC Calculation: 438 R Axis:   0 Text Interpretation:  Normal sinus rhythm Normal ECG No significant change was found Confirmed by Jola Schmidt [id number]) on 04/04/2017 4:01:31 PM       Radiology Dg Chest 2 View  Result Date: 04/04/2017 CLINICAL DATA:  Chest pain for 2-3 days EXAM: CHEST  2 VIEW COMPARISON:  07/08/2014 FINDINGS: The heart size and mediastinal contours are within normal limits. Both lungs are clear. The visualized skeletal structures are unremarkable. IMPRESSION: No active cardiopulmonary disease. Electronically Signed   By: Monte Fantasia M.D.   On: 04/04/2017 12:51   US Abdomen Limited Ruq  Result Date: 04/04/2017 CLINICAL DATA:  Chest pain and burning.  Prior gastric bypass. EXAM: ULTRASOUND ABDOMEN LIMITED RIGHT UPPER QUADRANT COMPARISON:  MRI dated 06/11/2013 FINDINGS: Gallbladder: Contracted gallbladder containing small gallstones measuring up to about 6 mm in diameter. Sonographic Murphy's sign absent. Common bile duct: Diameter: 9 mm in diameter, similar to the appearance on prior CT chest from 07/08/2014. No directly visualized choledocholithiasis. Liver: Coarse echogenic liver with poor sonic penetration compatible with diffuse hepatic steatosis. Portal vein is patent  on color Doppler imaging with normal direction of blood flow towards the liver. Other: Right kidney upper pole mass measures up to 3.5 cm. This was previously categorized as an angiomyolipoma by MRI, and previously measured up to 3.7 cm in length. IMPRESSION: 1. Echogenic filling defects in the contracted gallbladder favor gallstones. Sonographic Murphy's sign absent. 2. Chronic prominence of the extrahepatic biliary tree without directly visualized choledocholithiasis. The the biliary tree has been prominent back through the prior MRI from 06/11/2013. 3. Right kidney upper pole angiomyolipoma as depicted on prior MRI. 4. Coarse echogenic liver with  poor sonic penetration compatible with diffuse hepatic steatosis. Electronically Signed   By: Van Clines M.D.   On: 04/04/2017 12:54    Procedures Procedures (including critical care time)  Medications Ordered in ED Medications  aspirin chewable tablet 324 mg (324 mg Oral Given 04/04/17 1157)  gi cocktail (Maalox,Lidocaine,Donnatal) (30 mLs Oral Given 04/04/17 1159)  ketorolac (TORADOL) 30 MG/ML injection 15 mg (15 mg Intravenous Given 04/04/17 1412)     Initial Impression / Assessment and Plan / ED Course  I have reviewed the triage vital signs and the nursing notes.  Pertinent labs & imaging results that were available during my care of the patient were reviewed by me and considered in my medical decision making (see chart for details).     Pt presents to the Ed today with complaints of cp. Patient is to be discharged with recommendation to follow up with PCP in regards to today's hospital visit. Chest pain is not likely of cardiac or pulmonary etiology d/t presentation, d-dimer negative, VSS, no tracheal deviation, no JVD or new murmur, RRR, breath sounds equal bilaterally, EKG without any change from prior tracing and shows no signs of ischemia, negative deltatroponin, and negative CXR. Heart pathways score 3.  At times very atypical of ACS.   Seems more GI related. Lab work reassuring.  No elevation of liver enzymes.  Kidney function is normal.  No leukocytosis.  Lipase is unremarkable.  I did order ultrasound of abdomen to rule out any cholecystitis given history of cholelithiasis.  Ultrasound shows chronic prominence of common bile duct from prior imaging with no change.  No signs of acute cholecystitis.  Does note hepatic steatosis.  Pt has been advised to return to the ED is CP becomes exertional, associated with diaphoresis or nausea, radiates to left jaw/arm, worsens or becomes concerning in any way. Pt appears reliable for follow up and is agreeable to discharge.   Case has been discussed with and seen by Dr. Venora Maples who agrees with the above plan to discharge.    Final Clinical Impressions(s) / ED Diagnoses   Final diagnoses:  Chest pain  Atypical chest pain    ED Discharge Orders        Ordered    sucralfate (CARAFATE) 1 GM/10ML suspension  3 times daily with meals & bedtime     04/04/17 1611    pantoprazole (PROTONIX) 40 MG tablet  Daily     04/04/17 1611       Doristine Devoid, PA-C 04/04/17 1647    Doristine Devoid, PA-C 04/04/17 1651    Jola Schmidt, MD 04/04/17 2228

## 2017-04-04 NOTE — ED Notes (Signed)
PA student at bedside.

## 2017-04-04 NOTE — Discharge Instructions (Addendum)
Your workup has been reassuring in the ED.  Your symptoms may be consistent with acid reflux.  Avoid NSAIDs.  Make sure that you are taking your Protonix as prescribed have been given your dose in the ED today. Make sure that you are taking this medication. I would also start taking a zantac or pepcid daily. Have also given you carafate to take before meals.  If you develop worsening pain, bloody or dark stools return to the ED.  Make sure you follow-up with your GI doctor.

## 2017-06-26 ENCOUNTER — Emergency Department (HOSPITAL_COMMUNITY): Payer: Medicare (Managed Care)

## 2017-06-26 ENCOUNTER — Other Ambulatory Visit: Payer: Self-pay

## 2017-06-26 ENCOUNTER — Encounter (HOSPITAL_COMMUNITY): Payer: Self-pay | Admitting: Radiology

## 2017-06-26 ENCOUNTER — Observation Stay (HOSPITAL_COMMUNITY): Payer: Medicare (Managed Care)

## 2017-06-26 ENCOUNTER — Observation Stay (HOSPITAL_COMMUNITY)
Admission: EM | Admit: 2017-06-26 | Discharge: 2017-06-28 | Disposition: A | Discharge status: Home / Self Care | Payer: Medicare (Managed Care) | Attending: Internal Medicine | Admitting: Internal Medicine

## 2017-06-26 DIAGNOSIS — Z791 Long term (current) use of non-steroidal anti-inflammatories (NSAID): Secondary | ICD-10-CM | POA: Diagnosis not present

## 2017-06-26 DIAGNOSIS — D72829 Elevated white blood cell count, unspecified: Secondary | ICD-10-CM | POA: Diagnosis not present

## 2017-06-26 DIAGNOSIS — Z79899 Other long term (current) drug therapy: Secondary | ICD-10-CM

## 2017-06-26 DIAGNOSIS — Z833 Family history of diabetes mellitus: Secondary | ICD-10-CM | POA: Diagnosis not present

## 2017-06-26 DIAGNOSIS — T447X6A Underdosing of beta-adrenoreceptor antagonists, initial encounter: Secondary | ICD-10-CM | POA: Diagnosis not present

## 2017-06-26 DIAGNOSIS — I4510 Unspecified right bundle-branch block: Secondary | ICD-10-CM

## 2017-06-26 DIAGNOSIS — Z8249 Family history of ischemic heart disease and other diseases of the circulatory system: Secondary | ICD-10-CM

## 2017-06-26 DIAGNOSIS — K219 Gastro-esophageal reflux disease without esophagitis: Secondary | ICD-10-CM | POA: Diagnosis not present

## 2017-06-26 DIAGNOSIS — Z91128 Patient's intentional underdosing of medication regimen for other reason: Secondary | ICD-10-CM | POA: Diagnosis not present

## 2017-06-26 DIAGNOSIS — I2510 Atherosclerotic heart disease of native coronary artery without angina pectoris: Secondary | ICD-10-CM | POA: Diagnosis not present

## 2017-06-26 DIAGNOSIS — E1140 Type 2 diabetes mellitus with diabetic neuropathy, unspecified: Secondary | ICD-10-CM | POA: Diagnosis not present

## 2017-06-26 DIAGNOSIS — F329 Major depressive disorder, single episode, unspecified: Secondary | ICD-10-CM | POA: Diagnosis not present

## 2017-06-26 DIAGNOSIS — R079 Chest pain, unspecified: Secondary | ICD-10-CM

## 2017-06-26 DIAGNOSIS — Z9884 Bariatric surgery status: Secondary | ICD-10-CM | POA: Diagnosis not present

## 2017-06-26 DIAGNOSIS — R918 Other nonspecific abnormal finding of lung field: Secondary | ICD-10-CM

## 2017-06-26 DIAGNOSIS — E872 Acidosis: Secondary | ICD-10-CM | POA: Diagnosis not present

## 2017-06-26 DIAGNOSIS — F1010 Alcohol abuse, uncomplicated: Secondary | ICD-10-CM | POA: Insufficient documentation

## 2017-06-26 DIAGNOSIS — I471 Supraventricular tachycardia, unspecified: Secondary | ICD-10-CM | POA: Diagnosis present

## 2017-06-26 DIAGNOSIS — R072 Precordial pain: Secondary | ICD-10-CM | POA: Diagnosis not present

## 2017-06-26 DIAGNOSIS — F17210 Nicotine dependence, cigarettes, uncomplicated: Secondary | ICD-10-CM | POA: Insufficient documentation

## 2017-06-26 DIAGNOSIS — N3281 Overactive bladder: Secondary | ICD-10-CM | POA: Diagnosis not present

## 2017-06-26 DIAGNOSIS — I10 Essential (primary) hypertension: Secondary | ICD-10-CM | POA: Diagnosis not present

## 2017-06-26 LAB — COMPREHENSIVE METABOLIC PANEL
ALBUMIN: 3.3 g/dL — AB (ref 3.5–5.0)
ALT: 22 U/L (ref 14–54)
AST: 41 U/L (ref 15–41)
Alkaline Phosphatase: 79 U/L (ref 38–126)
Anion gap: 16 — ABNORMAL HIGH (ref 5–15)
BUN: 14 mg/dL (ref 6–20)
CHLORIDE: 112 mmol/L — AB (ref 101–111)
CO2: 17 mmol/L — AB (ref 22–32)
Calcium: 8.2 mg/dL — ABNORMAL LOW (ref 8.9–10.3)
Creatinine, Ser: 0.73 mg/dL (ref 0.44–1.00)
GFR calc Af Amer: >60 mL/min (ref >60)
GLUCOSE: 179 mg/dL — AB (ref 65–99)
Potassium: 4.8 mmol/L (ref 3.5–5.1)
SODIUM: 145 mmol/L (ref 135–145)
Total Bilirubin: 0.3 mg/dL (ref 0.3–1.2)
Total Protein: 6.2 g/dL — ABNORMAL LOW (ref 6.5–8.1)

## 2017-06-26 LAB — BASIC METABOLIC PANEL
ANION GAP: 9 (ref 5–15)
BUN: 11 mg/dL (ref 6–20)
CHLORIDE: 113 mmol/L — AB (ref 101–111)
CO2: 18 mmol/L — ABNORMAL LOW (ref 22–32)
Calcium: 8.3 mg/dL — ABNORMAL LOW (ref 8.9–10.3)
Creatinine, Ser: 0.77 mg/dL (ref 0.44–1.00)
GFR calc Af Amer: >60 mL/min (ref >60)
GFR calc non Af Amer: >60 mL/min (ref >60)
Glucose, Bld: 110 mg/dL — ABNORMAL HIGH (ref 65–99)
POTASSIUM: 4.2 mmol/L (ref 3.5–5.1)
SODIUM: 140 mmol/L (ref 135–145)

## 2017-06-26 LAB — CBC WITH DIFFERENTIAL/PLATELET
BASOS ABS: 0 10*3/uL (ref 0.0–0.1)
Basophils Relative: 0 %
EOS PCT: 0 %
Eosinophils Absolute: 0 10*3/uL (ref 0.0–0.7)
HCT: 47.4 % — ABNORMAL HIGH (ref 36.0–46.0)
Hemoglobin: 15.3 g/dL — ABNORMAL HIGH (ref 12.0–15.0)
LYMPHS PCT: 31 %
Lymphs Abs: 4.5 10*3/uL — ABNORMAL HIGH (ref 0.7–4.0)
MCH: 31.5 pg (ref 26.0–34.0)
MCHC: 32.3 g/dL (ref 30.0–36.0)
MCV: 97.5 fL (ref 78.0–100.0)
Monocytes Absolute: 0.9 10*3/uL (ref 0.1–1.0)
Monocytes Relative: 6 %
Neutro Abs: 9.2 10*3/uL — ABNORMAL HIGH (ref 1.7–7.7)
Neutrophils Relative %: 63 %
PLATELETS: 311 10*3/uL (ref 150–400)
RBC: 4.86 MIL/uL (ref 3.87–5.11)
RDW: 12.9 % (ref 11.5–15.5)
WBC: 14.6 10*3/uL — AB (ref 4.0–10.5)

## 2017-06-26 LAB — HEMOGLOBIN A1C
Hgb A1c MFr Bld: 5.4 % (ref 4.8–5.6)
Mean Plasma Glucose: 108.28 mg/dL

## 2017-06-26 LAB — TROPONIN I
Troponin I: <0.03 ng/mL (ref <0.03)
Troponin I: 0.04 ng/mL (ref <0.03)

## 2017-06-26 LAB — TSH: TSH: 0.604 u[IU]/mL (ref 0.350–4.500)

## 2017-06-26 LAB — CBG MONITORING, ED: GLUCOSE-CAPILLARY: 88 mg/dL (ref 65–99)

## 2017-06-26 MED ORDER — LORAZEPAM 1 MG PO TABS: 1.0000 mg | ORAL_TABLET | Freq: Four times a day (QID) | ORAL | Status: DC | PRN

## 2017-06-26 MED ORDER — LORAZEPAM 2 MG/ML IJ SOLN: 1.0000 mg | Freq: Four times a day (QID) | INTRAMUSCULAR | Status: DC | PRN

## 2017-06-26 MED ORDER — IOPAMIDOL (ISOVUE-300) INJECTION 61%
75.0000 mL | Freq: Once | INTRAVENOUS | Status: AC | PRN
  Administered 2017-06-26: 75 mL via INTRAVENOUS

## 2017-06-26 MED ORDER — ONDANSETRON HCL 4 MG/2ML IJ SOLN
4.0000 mg | Freq: Once | INTRAMUSCULAR | Status: AC
  Administered 2017-06-26: 4 mg via INTRAVENOUS
  Filled 2017-06-26: qty 2

## 2017-06-26 MED ORDER — THIAMINE HCL 100 MG/ML IJ SOLN: 100.0000 mg | Freq: Every day | INTRAMUSCULAR | Status: DC

## 2017-06-26 MED ORDER — GABAPENTIN 300 MG PO CAPS
300.0000 mg | ORAL_CAPSULE | Freq: Three times a day (TID) | ORAL | Status: DC
  Administered 2017-06-26 – 2017-06-28 (×5): 300 mg via ORAL
  Filled 2017-06-26 (×5): qty 1

## 2017-06-26 MED ORDER — LACTATED RINGERS IV SOLN
INTRAVENOUS | Status: DC
  Administered 2017-06-26 – 2017-06-27 (×2): via INTRAVENOUS

## 2017-06-26 MED ORDER — INSULIN ASPART 100 UNIT/ML ~~LOC~~ SOLN: 0.0000 [IU] | Freq: Three times a day (TID) | SUBCUTANEOUS | Status: DC

## 2017-06-26 MED ORDER — SODIUM CHLORIDE 0.9 % IV BOLUS
1000.0000 mL | Freq: Once | INTRAVENOUS | Status: AC
  Administered 2017-06-26: 1000 mL via INTRAVENOUS

## 2017-06-26 MED ORDER — OXYBUTYNIN CHLORIDE ER 10 MG PO TB24
10.0000 mg | ORAL_TABLET | Freq: Every day | ORAL | Status: DC
  Administered 2017-06-26 – 2017-06-27 (×2): 10 mg via ORAL
  Filled 2017-06-26 (×3): qty 1

## 2017-06-26 MED ORDER — ENOXAPARIN SODIUM 40 MG/0.4ML ~~LOC~~ SOLN
40.0000 mg | SUBCUTANEOUS | Status: DC
  Administered 2017-06-26 – 2017-06-27 (×2): 40 mg via SUBCUTANEOUS
  Filled 2017-06-26 (×3): qty 0.4

## 2017-06-26 MED ORDER — METOPROLOL TARTRATE 5 MG/5ML IV SOLN
INTRAVENOUS | Status: AC
  Administered 2017-06-26: 2.5 mg
  Filled 2017-06-26: qty 5

## 2017-06-26 MED ORDER — METOCLOPRAMIDE HCL 5 MG/ML IJ SOLN
5.0000 mg | Freq: Once | INTRAMUSCULAR | Status: AC
  Administered 2017-06-26: 5 mg via INTRAVENOUS
  Filled 2017-06-26: qty 2

## 2017-06-26 MED ORDER — METOPROLOL TARTRATE 25 MG PO TABS: 50.0000 mg | ORAL_TABLET | Freq: Two times a day (BID) | ORAL | Status: DC

## 2017-06-26 MED ORDER — VITAMIN B-1 100 MG PO TABS
100.0000 mg | ORAL_TABLET | Freq: Every day | ORAL | Status: DC
  Administered 2017-06-26 – 2017-06-28 (×3): 100 mg via ORAL
  Filled 2017-06-26 (×3): qty 1

## 2017-06-26 MED ORDER — PANTOPRAZOLE SODIUM 40 MG PO TBEC
40.0000 mg | DELAYED_RELEASE_TABLET | Freq: Every day | ORAL | Status: DC
  Administered 2017-06-26 – 2017-06-28 (×3): 40 mg via ORAL
  Filled 2017-06-26 (×3): qty 1

## 2017-06-26 MED ORDER — ADULT MULTIVITAMIN W/MINERALS CH
1.0000 | ORAL_TABLET | Freq: Every day | ORAL | Status: DC
  Administered 2017-06-26 – 2017-06-28 (×3): 1 via ORAL
  Filled 2017-06-26 (×3): qty 1

## 2017-06-26 MED ORDER — ADENOSINE 6 MG/2ML IV SOLN
INTRAVENOUS | Status: AC
Start: 2017-06-26 — End: 2017-06-26
  Administered 2017-06-26: 6 mg
  Filled 2017-06-26: qty 2

## 2017-06-26 MED ORDER — METOPROLOL TARTRATE 5 MG/5ML IV SOLN: 2.5000 mg | Freq: Once | INTRAVENOUS | Status: DC

## 2017-06-26 MED ORDER — FOLIC ACID 1 MG PO TABS
1.0000 mg | ORAL_TABLET | Freq: Every day | ORAL | Status: DC
  Administered 2017-06-26 – 2017-06-28 (×3): 1 mg via ORAL
  Filled 2017-06-26 (×3): qty 1

## 2017-06-26 MED ORDER — SODIUM CHLORIDE 0.9 % IV SOLN
INTRAVENOUS | Status: DC
  Administered 2017-06-26: 09:00:00 via INTRAVENOUS

## 2017-06-26 MED ORDER — FAMOTIDINE 20 MG PO TABS: 20.0000 mg | ORAL_TABLET | Freq: Two times a day (BID) | ORAL | Status: DC | PRN

## 2017-06-26 MED ORDER — METOPROLOL TARTRATE 50 MG PO TABS
50.0000 mg | ORAL_TABLET | Freq: Two times a day (BID) | ORAL | Status: DC
  Administered 2017-06-26 – 2017-06-28 (×4): 50 mg via ORAL
  Filled 2017-06-26 (×4): qty 1

## 2017-06-26 MED ORDER — DIPHENHYDRAMINE HCL 50 MG/ML IJ SOLN
12.5000 mg | Freq: Once | INTRAMUSCULAR | Status: AC
  Administered 2017-06-26: 12.5 mg via INTRAVENOUS
  Filled 2017-06-26: qty 1

## 2017-06-26 MED ORDER — ADENOSINE 6 MG/2ML IV SOLN
INTRAVENOUS | Status: AC
  Administered 2017-06-26: 6 mg
  Filled 2017-06-26: qty 2

## 2017-06-26 NOTE — ED Triage Notes (Signed)
Patient arrived via EMS, family called for chest pain, patient is hypertensive, HR 164

## 2017-06-26 NOTE — ED Notes (Signed)
ORDERED DIET TRAY FOR PT  

## 2017-06-26 NOTE — ED Triage Notes (Signed)
Ems=Patient received 1500 of normal saline, 324 aspirin, 4mg  zofran

## 2017-06-26 NOTE — Progress Notes (Signed)
Per patient, states that she only drinks 12 pack of beer in the weekends only and not every day.  Patient will be monitored through the CIWA scale per MD orders.

## 2017-06-26 NOTE — H&P (Addendum)
Date: 06/26/2017               Patient Name:  Shannon Andrade MRN: [id number]  DOB: Jul 05, 1955 Age / Sex: 62 y.o., female   PCP: Patient, No Pcp Per         Medical Service: Internal Medicine Teaching Service         Attending Physician: Dr. Lacretia Leigh, MD    First Contact: Dr. Johny Chess Pager: [phone]  Second Contact: Dr. Heber [place] Pager: [phone]       After Hours (After 5p/  First Contact Pager: [phone]  weekends / holidays): Second Contact Pager: [phone]   Chief Complaint: Chest pain, heart palpitations  History of Present Illness: Patient is a 62 year old female with a past medical history of hypertension, type 2 diabetes, paroxysmal SVT, GERD, depression presenting to the hospital for further evaluation of chest pain and heart palpitations.  Patient states she has been having intermittent substernal, burning, nonradiating chest pain associated with diaphoresis, shortness of breath, and nausea for the past 1 week.  States the chest pain happens whenever she is having heart palpitations.  This morning around 5-6 AM she again had heart palpitations and experienced chest pain.  Denies having any chest pain at present.  Home medications include metoprolol 50 mg twice daily.  Patient reports taking this medication once a day.  She is from [place] and currently visiting her sons who live here.  She was planning to go back to [place] the end of this week. States she was previously on amiodarone which was stopped by her cardiologist in [place].  Reports having chronic dry cough, especially at night.  States she has intentionally lost 7 pounds in the past 1 month by controlling her diet.  She denies having any fevers or abdominal pain.  Denies having hemoptysis.  States she visited [place] a month and a half ago but denies exposure to any individuals with TB.  No other complaints.  Meds:  Current Meds  Medication Sig  . acetaminophen (TYLENOL) 325 MG tablet Take 650 mg by  mouth every 6 (six) hours as needed for mild pain.  . Diclofenac Sodium (PENNSAID) 2 % SOLN Apply 1 application topically daily as needed (pain).  . DUEXIS 800-26.6 MG TABS Take 1 tablet by mouth daily.  . enalapril (VASOTEC) 10 MG tablet Take 10 mg by mouth daily.  . ferrous sulfate 325 (65 FE) MG tablet Take 325 mg by mouth daily with breakfast.  . fluticasone (FLONASE) 50 MCG/ACT nasal spray PLACE 2 SPRAYS INTO BOTH NOSTRILS DAILY. (Patient taking differently: PLACE 2 SPRAYS INTO BOTH NOSTRILS DAILY AS NEEDED FOR ALLERGIES)  . gabapentin (NEURONTIN) 300 MG capsule Take 1 capsule (300 mg total) by mouth 3 (three) times daily.  . meclizine (ANTIVERT) 25 MG tablet Take 1 tablet (25 mg total) by mouth 3 (three) times daily as needed for dizziness. (Patient taking differently: Take 25 mg by mouth 3 (three) times daily. )  . meloxicam (MOBIC) 7.5 MG tablet Take 1 tablet (7.5 mg total) by mouth daily. (Patient taking differently: Take 15 mg by mouth daily. )  . metoprolol (LOPRESSOR) 50 MG tablet TAKE 1 TABLET BY MOUTH 2 TIMES DAILY  . omeprazole (PRILOSEC) 40 MG capsule Take 40 mg by mouth daily.  Marland Kitchen oxybutynin (DITROPAN-XL) 10 MG 24 hr tablet Take 1 tablet (10 mg total) by mouth at bedtime.  . ranitidine (ZANTAC) 150 MG tablet Take 150 mg by mouth 2 (two)  times daily as needed for heartburn.     Allergies: Allergies as of 06/26/2017  . (No Known Allergies)   Past Medical History:  Diagnosis Date  . Anemia   . Angiomyolipoma of right kidney   . Arthritis    "hands, feet, back, neck" (11/04/2013)  . Chronic back pain    "neck down into lower back" (11/04/2013)  . DDD (degenerative disc disease)   . Depression   . GERD (gastroesophageal reflux disease)   . Hepatitis    "when I was pregnant; got the one that's not bad"   . Hypertension   . Kidney stones   . Migraine    "q few months" (11/04/2013)  . Paroxysmal SVT (supraventricular tachycardia) ([place])   . Tachycardia   . Type II diabetes  mellitus ([place])    "took Metformin"; resolved S/P gastric bypass    Family History: Mother with diabetes.  Father had a heart attack in his 41s.  Sister had a stroke.  Social History: Smokes 1/2 pack of cigarettes in a day but not on a regular basis; smoking for the past 40 years.  Drinks a 12 pack of beer at a time 5-6 times a week; most recent use was yesterday.  Denies drug use.  Review of Systems: A complete ROS was negative except as per HPI.  Physical Exam: Blood pressure 111/81, pulse 90, temperature 97.9 F (36.6 C), resp. rate (!) 23, SpO2 97 %. Physical Exam  Constitutional: She is oriented to person, place, and time. She appears well-developed and well-nourished. No distress.  HENT:  Mouth/Throat: Oropharynx is clear and moist.  Cardiovascular: Normal rate, regular rhythm and intact distal pulses. Exam reveals no gallop and no friction rub.  Pulmonary/Chest: Effort normal and breath sounds normal. No respiratory distress. She has no wheezes. She has no rales.  Abdominal: Soft. Bowel sounds are normal. She exhibits no distension. There is no tenderness.  Musculoskeletal: She exhibits no edema.  Neurological: She is alert and oriented to person, place, and time.  Skin: Skin is warm and dry.  Psychiatric: She has a normal mood and affect.   EKG: personally reviewed my interpretation is sinus rhythm with right bundle branch block.  CXR: personally reviewed my interpretation is mild prominence of the mediastinum in the right paratracheal region.  Assessment & Plan by Problem: Active Problems:   * No active hospital problems. *  62 year old female with a past medical history of hypertension, type 2 diabetes, paroxysmal SVT, GERD, depression presenting to the hospital for further evaluation of chest pain and heart palpitations.  SVT: Likely due to suboptimal dosing of home metoprolol.  She is supposed to be taking metoprolol 50 mg twice daily but has only been taking it once a  day.  She has a history of paroxysmal SVT. Heart rate in the 160s on arrival.  Hypotensive and received IVF in the ED and systolic pressure improved to above 100.  Given 2.5 mg of Lopressor IV push without response in the ED.  Was given adenosine 6 mg with temporary improvement of her rate to sinus but then she became more tachycardic.  Adenosine pressure was repeated at 6 mg in the ED and currently in sinus rhythm.  TSH normal.  Currently in sinus rhythm with heart rate in low 70s. -Monitor on telemetry -Resume home metoprolol 50 mg twice daily -Echo to check valvular function   Chest pain: Likely related to SVT as it only happens when she gets heart palpitations. I-STAT troponin negative.  EKG with right bundle branch block.  TSH normal.  She was admitted in September 2015 for chest pain and nuclear stress test done at that time was negative. Received aspirin via EMS.  Heart score 3 (low risk).  Currently chest pain-free and in sinus rhythm. -Monitor on telemetry -Continue to trend troponin -Echo  Abnormal chest x-ray: Showing mild prominence of the mediastinum in the right paratracheal region.  She is a current 1/2 pack/day and has been smoking for the past 40 years.  Complaining of chronic dry cough and does endorse losing 7 pounds in the past 1 month which she thinks is intentional. -CT chest with contrast to rule out pulmonary mass/ lymphadenopathy -Counseled on smoking cessation  Mild leukocytosis: White count 14.6 likely related to stress versus hemoconcentration.  Hemoglobin and platelet count are also increased which would be consistent with hemoconcentration. -Repeat CBC in a.m.  High anion gap metabolic acidosis: Bicarb 17 and anion gap 16.  DKA less likely as glucose 179.  -Check urine for ketones -She received IV fluid bolus in the ED.  Continue fluid resuscitation with lactated Ringer's at 125 cc/hr.  Hypertension: Hypotensive in the ED; BP now improved with IV fluid. -Continue  home metoprolol 50 mg twice daily in the setting of SVT -Hold home lisinopril  Type 2 diabetes: Not on home medications. -Sliding scale insulin sensitive with meals -Check A1c  Alcohol abuse: Patient drinks an entire 12 pack of beer 5 to 6 days a week. -CIWA monitoring with prn Ativan   Diabetic neuropathy -Continue home gabapentin 300 mg 3 times a day  GERD -Continue home Protonix 40 mg daily -Pepcid 20 mg BID prn  Overactive bladder -Continue home oxybutynin 10 mg at bedtime  Diet: Carb modified  DVT prophylaxis: Lovenox  CODE STATUS: Patient wishes to be full code.  Dispo: Admit patient to Observation with expected length of stay less than 2 midnights.  Signed: Shela Leff, MD 06/26/2017, 10:23 AM  Pager: [phone]

## 2017-06-26 NOTE — ED Provider Notes (Addendum)
[facility] EMERGENCY DEPARTMENT Provider Note   CSN: [id number] Arrival date & time: 06/26/17  4098     History   Chief Complaint Chief Complaint  Patient presents with  . Chest Pain    SVT  . Hypertension    HPI Shannon Andrade is a 62 y.o. female.  62 year old female presents with several days of persistent chest pain was then developed into diaphoresis and tachycardia this morning.  Does have a history of SVT as well as hypertension.  Takes daily beta-blockers and has been compliant with this.  No exertional component to her symptoms.  Called EMS and was found to be in SVT as well as hypotensive.  Patient given IV fluids Zofran and aspirin and transported here.     Past Medical History:  Diagnosis Date  . Anemia   . Angiomyolipoma of right kidney   . Arthritis    "hands, feet, back, neck" (11/04/2013)  . Chronic back pain    "neck down into lower back" (11/04/2013)  . DDD (degenerative disc disease)   . Depression   . GERD (gastroesophageal reflux disease)   . Hepatitis    "when I was pregnant; got the one that's not bad"   . Hypertension   . Kidney stones   . Migraine    "q few months" (11/04/2013)  . Paroxysmal SVT (supraventricular tachycardia) ([place])   . Tachycardia   . Type II diabetes mellitus ([place])    "took Metformin"; resolved S/P gastric bypass    Patient Active Problem List   Diagnosis Date Noted  . Urinary incontinence 12/08/2014  . Pelvic pain in female 12/08/2014  . Obesity (BMI 30.0-34.9) 12/08/2014  . Musculoskeletal chest pain 12/30/2013  . Dysphagia, pharyngoesophageal phase 11/28/2013  . Chronic pain syndrome 07/10/2013  . History of cardiac arrhythmia 07/10/2013  . Essential hypertension, benign 07/10/2013  . Unspecified vitamin D deficiency 07/10/2013  . Palpitations 07/10/2013    Past Surgical History:  Procedure Laterality Date  . APPENDECTOMY  ~ 1972  . ESOPHAGOGASTRODUODENOSCOPY (EGD) WITH PROPOFOL N/A  11/28/2013   Procedure: ESOPHAGOGASTRODUODENOSCOPY (EGD) WITH PROPOFOL;  Surgeon: Inda Castle, MD;  Location: [facility] ENDOSCOPY;  Service: Endoscopy;  Laterality: N/A;  . GASTRIC BYPASS  2008  . KIDNEY STONE SURGERY  ~ 2004   "cut them out"  . REDUCTION MAMMAPLASTY Bilateral ~ 2010     OB History    Gravida  5   Para  3   Term  3   Preterm      AB  2   Living  3     SAB      TAB  2   Ectopic      Multiple      Live Births               Home Medications    Prior to Admission medications   Medication Sig Start Date End Date Taking? Authorizing Provider  enalapril (VASOTEC) 10 MG tablet Take 10 mg by mouth daily.    [provider]  fluticasone (FLONASE) 50 MCG/ACT nasal spray PLACE 2 SPRAYS INTO BOTH NOSTRILS DAILY. 04/07/15   Funches, Adriana Mccallum, MD  gabapentin (NEURONTIN) 300 MG capsule Take 1 capsule (300 mg total) by mouth 3 (three) times daily. 08/20/14   Lorayne Marek, MD  Hyoscyamine Sulfate 0.375 MG TBCR Take 1 tab bid prn abdominal pain 11/28/13   Inda Castle, MD  meclizine (ANTIVERT) 25 MG tablet Take 1 tablet (25 mg total) by  mouth 3 (three) times daily as needed for dizziness. 08/20/14   Lorayne Marek, MD  meloxicam (MOBIC) 7.5 MG tablet Take 1 tablet (7.5 mg total) by mouth daily. 12/08/14   Funches, Adriana Mccallum, MD  metoprolol (LOPRESSOR) 50 MG tablet TAKE 1 TABLET BY MOUTH 2 TIMES DAILY 03/05/15   Funches, Adriana Mccallum, MD  oxybutynin (DITROPAN-XL) 10 MG 24 hr tablet Take 1 tablet (10 mg total) by mouth at bedtime. 12/08/14   Funches, Adriana Mccallum, MD  pantoprazole (PROTONIX) 40 MG tablet Take 1 tablet (40 mg total) by mouth daily. 04/04/17   Doristine Devoid, PA-C  sucralfate (CARAFATE) 1 GM/10ML suspension Take 10 mLs (1 g total) by mouth 4 (four) times daily -  with meals and at bedtime. 04/04/17   Doristine Devoid, PA-C  traMADol-acetaminophen (ULTRACET) 37.5-325 MG tablet Take 1 tablet by mouth every 8 (eight) hours as needed for moderate pain. 03/06/15    Boykin Nearing, MD    Family History Family History  Problem Relation Age of Onset  . Diabetes Mother   . Heart disease Father     Social History Social History   Tobacco Use  . Smoking status: Current Some Day Smoker    Packs/day: 0.50    Years: 43.00    Pack years: 21.50    Types: Cigarettes  . Smokeless tobacco: Never Used  Substance Use Topics  . Alcohol use: Yes    Alcohol/week: 50.4 oz    Types: 84 Cans of beer per week    Comment: occasional  . Drug use: No     Allergies   Patient has no known allergies.   Review of Systems Review of Systems  All other systems reviewed and are negative.    Physical Exam Updated Vital Signs BP 103/83 (BP Location: Left Arm)   Pulse (!) 165   Resp 20   Physical Exam  Constitutional: She is oriented to person, place, and time. She appears well-developed and well-nourished.  Non-toxic appearance. No distress.  HENT:  Head: Normocephalic and atraumatic.  Eyes: Pupils are equal, round, and reactive to light. Conjunctivae, EOM and lids are normal.  Neck: Normal range of motion. Neck supple. No tracheal deviation present. No thyroid mass present.  Cardiovascular: Regular rhythm and normal heart sounds. Tachycardia present. Exam reveals no gallop.  No murmur heard. Pulmonary/Chest: Effort normal and breath sounds normal. No stridor. No respiratory distress. She has no decreased breath sounds. She has no wheezes. She has no rhonchi. She has no rales.  Abdominal: Soft. Normal appearance and bowel sounds are normal. She exhibits no distension. There is no tenderness. There is no rebound and no CVA tenderness.  Musculoskeletal: Normal range of motion. She exhibits no edema or tenderness.  Neurological: She is alert and oriented to person, place, and time. She has normal strength. No cranial nerve deficit or sensory deficit. GCS eye subscore is 4. GCS verbal subscore is 5. GCS motor subscore is 6.  Skin: Skin is warm. No abrasion  and no rash noted. She is diaphoretic.  Psychiatric: Her speech is normal and behavior is normal. Her mood appears anxious.  Nursing note and vitals reviewed.    ED Treatments / Results  Labs (all labs ordered are listed, but only abnormal results are displayed) Labs Reviewed  CBC WITH DIFFERENTIAL/PLATELET  COMPREHENSIVE METABOLIC PANEL  TROPONIN I    EKG EKG Interpretation  Date/Time:  Monday June 26 2017 08:26:27 EDT Ventricular Rate:  166 PR Interval:    QRS Duration: 109 QT  Interval:  323 QTC Calculation: 537 R Axis:   -33 Text Interpretation:  Supraventricular tachycardia Incomplete RBBB and LAFB Consider right ventricular hypertrophy Confirmed by Lacretia Leigh ([id number]) on 06/26/2017 8:33:14 AM   Radiology No results found.  Procedures Procedures (including critical care time)  Medications Ordered in ED Medications  sodium chloride 0.9 % bolus 1,000 mL (has no administration in time range)  0.9 %  sodium chloride infusion (has no administration in time range)  metoprolol tartrate (LOPRESSOR) 5 MG/5ML injection (has no administration in time range)  metoprolol tartrate (LOPRESSOR) injection 2.5 mg (has no administration in time range)     Initial Impression / Assessment and Plan / ED Course  I have reviewed the triage vital signs and the nursing notes.  Pertinent labs & imaging results that were available during my care of the patient were reviewed by me and considered in my medical decision making (see chart for details).     8:53 AM Patient SVT here and was given 2.5 mg of Lopressor IV push without response.  Was given IV fluids and systolic blood pressure was above 100.  Was given adenosine 6 mg with temporary improvement of her rate to sinus but then she became more tachycardic.  The adenosine dose was repeated again at 6 mg and she is now in sinus rhythm.  10:15 AM Patient's troponin is negative.  Her heart rate is greatly improved.  Due to her  complaint of chest pain will admit for observation.   CRITICAL CARE Performed by: Leota Jacobsen Total critical care time: 60 minutes Critical care time was exclusive of separately billable procedures and treating other patients. Critical care was necessary to treat or prevent imminent or life-threatening deterioration. Critical care was time spent personally by me on the following activities: development of treatment plan with patient and/or surrogate as well as nursing, discussions with consultants, evaluation of patient's response to treatment, examination of patient, obtaining history from patient or surrogate, ordering and performing treatments and interventions, ordering and review of laboratory studies, ordering and review of radiographic studies, pulse oximetry and re-evaluation of patient's condition.   Final Clinical Impressions(s) / ED Diagnoses   Final diagnoses:  None    ED Discharge Orders    None       Lacretia Leigh, MD 06/26/17 1016    Lacretia Leigh, MD 06/26/17 1016

## 2017-06-27 ENCOUNTER — Ambulatory Visit (HOSPITAL_COMMUNITY)
Admission: EM | Disposition: A | Discharge status: Home / Self Care | Payer: Self-pay | Source: Home / Self Care | Attending: Emergency Medicine

## 2017-06-27 ENCOUNTER — Observation Stay (HOSPITAL_COMMUNITY): Payer: Medicare (Managed Care)

## 2017-06-27 DIAGNOSIS — F329 Major depressive disorder, single episode, unspecified: Secondary | ICD-10-CM | POA: Diagnosis not present

## 2017-06-27 DIAGNOSIS — F1099 Alcohol use, unspecified with unspecified alcohol-induced disorder: Secondary | ICD-10-CM | POA: Diagnosis not present

## 2017-06-27 DIAGNOSIS — R079 Chest pain, unspecified: Secondary | ICD-10-CM

## 2017-06-27 DIAGNOSIS — M546 Pain in thoracic spine: Secondary | ICD-10-CM

## 2017-06-27 DIAGNOSIS — I10 Essential (primary) hypertension: Secondary | ICD-10-CM | POA: Diagnosis not present

## 2017-06-27 DIAGNOSIS — I471 Supraventricular tachycardia: Secondary | ICD-10-CM | POA: Diagnosis not present

## 2017-06-27 DIAGNOSIS — E119 Type 2 diabetes mellitus without complications: Secondary | ICD-10-CM | POA: Diagnosis not present

## 2017-06-27 DIAGNOSIS — K219 Gastro-esophageal reflux disease without esophagitis: Secondary | ICD-10-CM | POA: Diagnosis not present

## 2017-06-27 DIAGNOSIS — Z79899 Other long term (current) drug therapy: Secondary | ICD-10-CM | POA: Diagnosis not present

## 2017-06-27 HISTORY — PX: LEFT HEART CATH AND CORONARY ANGIOGRAPHY: CATH118249

## 2017-06-27 LAB — BASIC METABOLIC PANEL
Anion gap: 5 (ref 5–15)
BUN: 11 mg/dL (ref 6–20)
CHLORIDE: 108 mmol/L (ref 101–111)
CO2: 25 mmol/L (ref 22–32)
Calcium: 8.3 mg/dL — ABNORMAL LOW (ref 8.9–10.3)
Creatinine, Ser: 0.55 mg/dL (ref 0.44–1.00)
GFR calc Af Amer: >60 mL/min (ref >60)
GFR calc non Af Amer: >60 mL/min (ref >60)
GLUCOSE: 92 mg/dL (ref 65–99)
POTASSIUM: 4.1 mmol/L (ref 3.5–5.1)
SODIUM: 138 mmol/L (ref 135–145)

## 2017-06-27 LAB — ECHOCARDIOGRAM COMPLETE
Height: 67 in
WEIGHTICAEL: 3836.8 [oz_av]

## 2017-06-27 LAB — CBC
HCT: 35.9 % — ABNORMAL LOW (ref 36.0–46.0)
HEMOGLOBIN: 11.5 g/dL — AB (ref 12.0–15.0)
MCH: 30.8 pg (ref 26.0–34.0)
MCHC: 32 g/dL (ref 30.0–36.0)
MCV: 96.2 fL (ref 78.0–100.0)
Platelets: 240 10*3/uL (ref 150–400)
RBC: 3.73 MIL/uL — AB (ref 3.87–5.11)
RDW: 12.8 % (ref 11.5–15.5)
WBC: 7.7 10*3/uL (ref 4.0–10.5)

## 2017-06-27 LAB — TROPONIN I
TROPONIN I: 0.03 ng/mL — AB (ref <0.03)
Troponin I: 0.03 ng/mL (ref <0.03)

## 2017-06-27 LAB — GLUCOSE, CAPILLARY
GLUCOSE-CAPILLARY: 92 mg/dL (ref 65–99)
GLUCOSE-CAPILLARY: 99 mg/dL (ref 65–99)
Glucose-Capillary: 100 mg/dL — ABNORMAL HIGH (ref 65–99)
Glucose-Capillary: 100 mg/dL — ABNORMAL HIGH (ref 65–99)

## 2017-06-27 LAB — HIV ANTIBODY (ROUTINE TESTING W REFLEX): HIV SCREEN 4TH GENERATION: NONREACTIVE

## 2017-06-27 SURGERY — LEFT HEART CATH AND CORONARY ANGIOGRAPHY
Anesthesia: LOCAL

## 2017-06-27 MED ORDER — ONDANSETRON HCL 4 MG/2ML IJ SOLN: 4.0000 mg | Freq: Four times a day (QID) | INTRAMUSCULAR | Status: DC | PRN

## 2017-06-27 MED ORDER — METOPROLOL TARTRATE 5 MG/5ML IV SOLN
5.0000 mg | Freq: Once | INTRAVENOUS | Status: AC
  Administered 2017-06-27: 5 mg via INTRAVENOUS

## 2017-06-27 MED ORDER — ASPIRIN 81 MG PO CHEW
CHEWABLE_TABLET | ORAL | Status: AC
  Filled 2017-06-27: qty 1

## 2017-06-27 MED ORDER — SODIUM CHLORIDE 0.9% FLUSH: 3.0000 mL | INTRAVENOUS | Status: DC | PRN

## 2017-06-27 MED ORDER — SODIUM CHLORIDE 0.9 % IV SOLN: 250.0000 mL | INTRAVENOUS | Status: DC | PRN

## 2017-06-27 MED ORDER — MIDAZOLAM HCL 2 MG/2ML IJ SOLN
INTRAMUSCULAR | Status: AC
  Filled 2017-06-27: qty 2

## 2017-06-27 MED ORDER — LIDOCAINE HCL (PF) 1 % IJ SOLN
INTRAMUSCULAR | Status: DC | PRN
  Administered 2017-06-27: 15 mL

## 2017-06-27 MED ORDER — FENTANYL CITRATE (PF) 100 MCG/2ML IJ SOLN
INTRAMUSCULAR | Status: AC
  Filled 2017-06-27: qty 2

## 2017-06-27 MED ORDER — HEPARIN (PORCINE) IN NACL 2-0.9 UNITS/ML
INTRAMUSCULAR | Status: AC | PRN
  Administered 2017-06-27 (×2): 500 mL via INTRA_ARTERIAL

## 2017-06-27 MED ORDER — SODIUM CHLORIDE 0.9 % IV SOLN: INTRAVENOUS | Status: AC

## 2017-06-27 MED ORDER — HEPARIN (PORCINE) IN NACL 1000-0.9 UT/500ML-% IV SOLN
INTRAVENOUS | Status: AC
  Filled 2017-06-27: qty 1000

## 2017-06-27 MED ORDER — METOPROLOL TARTRATE 50 MG PO TABS: 50.0000 mg | ORAL_TABLET | Freq: Two times a day (BID) | ORAL | 2 refills | Status: DC

## 2017-06-27 MED ORDER — LIDOCAINE HCL (PF) 1 % IJ SOLN
INTRAMUSCULAR | Status: AC
  Filled 2017-06-27: qty 30

## 2017-06-27 MED ORDER — IOPAMIDOL (ISOVUE-370) INJECTION 76%
INTRAVENOUS | Status: DC | PRN
  Administered 2017-06-27: 30 mL via INTRA_ARTERIAL

## 2017-06-27 MED ORDER — SODIUM CHLORIDE 0.9% FLUSH: 3.0000 mL | Freq: Two times a day (BID) | INTRAVENOUS | Status: DC

## 2017-06-27 MED ORDER — ACETAMINOPHEN 325 MG PO TABS: 650.0000 mg | ORAL_TABLET | ORAL | Status: DC | PRN

## 2017-06-27 MED ORDER — IOPAMIDOL (ISOVUE-370) INJECTION 76%
INTRAVENOUS | Status: AC
  Filled 2017-06-27: qty 100

## 2017-06-27 MED ORDER — FENTANYL CITRATE (PF) 100 MCG/2ML IJ SOLN
INTRAMUSCULAR | Status: DC | PRN
  Administered 2017-06-27 (×3): 25 ug via INTRAVENOUS

## 2017-06-27 MED ORDER — ASPIRIN 81 MG PO CHEW: 81.0000 mg | CHEWABLE_TABLET | ORAL | Status: DC

## 2017-06-27 MED ORDER — METOPROLOL TARTRATE 5 MG/5ML IV SOLN
INTRAVENOUS | Status: AC
  Filled 2017-06-27: qty 5

## 2017-06-27 MED ORDER — SODIUM CHLORIDE 0.9 % IV SOLN
INTRAVENOUS | Status: DC
  Administered 2017-06-27: 16:00:00 via INTRAVENOUS

## 2017-06-27 MED ORDER — METOPROLOL TARTRATE 50 MG PO TABS: 50.0000 mg | ORAL_TABLET | Freq: Two times a day (BID) | ORAL | 2 refills | Status: AC

## 2017-06-27 MED ORDER — ASPIRIN 81 MG PO CHEW
CHEWABLE_TABLET | ORAL | Status: DC | PRN
  Administered 2017-06-27: 81 mg via ORAL

## 2017-06-27 MED ORDER — MIDAZOLAM HCL 2 MG/2ML IJ SOLN
INTRAMUSCULAR | Status: DC | PRN
  Administered 2017-06-27 (×2): 1 mg via INTRAVENOUS

## 2017-06-27 MED ORDER — METOPROLOL TARTRATE 5 MG/5ML IV SOLN
INTRAVENOUS | Status: DC | PRN
  Administered 2017-06-27: 5 mg via INTRAVENOUS

## 2017-06-27 SURGICAL SUPPLY — 6 items
CATH INFINITI 5FR MULTPACK ANG (CATHETERS) ×2 IMPLANT
KIT HEART LEFT (KITS) ×2 IMPLANT
PACK CARDIAC CATHETERIZATION (CUSTOM PROCEDURE TRAY) ×2 IMPLANT
SHEATH AVANTI 11CM 5FR (SHEATH) ×2 IMPLANT
TRANSDUCER W/STOPCOCK (MISCELLANEOUS) ×2 IMPLANT
WIRE EMERALD 3MM-J .035X150CM (WIRE) ×2 IMPLANT

## 2017-06-27 NOTE — Discharge Summary (Signed)
Name: Shannon Andrade MRN: [id number] DOB: 1955-07-28 62 y.o. PCP: Patient, No Pcp Per  Date of Admission: 06/26/2017  8:22 AM Date of Discharge: 06/28/2017 Attending Physician: Oval Linsey  Discharge Diagnosis: Active Problems:   SVT (supraventricular tachycardia) [facility])   Discharge Medications: Allergies as of 06/28/2017   No Known Allergies     Medication List    STOP taking these medications   Hyoscyamine Sulfate 0.375 MG Tbcr   meloxicam 7.5 MG tablet Commonly known as:  MOBIC   pantoprazole 40 MG tablet Commonly known as:  PROTONIX   sucralfate 1 GM/10ML suspension Commonly known as:  CARAFATE   traMADol-acetaminophen 37.5-325 MG tablet Commonly known as:  ULTRACET     TAKE these medications   acetaminophen 325 MG tablet Commonly known as:  TYLENOL Take 650 mg by mouth every 6 (six) hours as needed for mild pain.   DUEXIS 800-26.6 MG Tabs Generic drug:  Ibuprofen-Famotidine Take 1 tablet by mouth daily.   enalapril 10 MG tablet Commonly known as:  VASOTEC Take 10 mg by mouth daily.   ferrous sulfate 325 (65 FE) MG tablet Take 325 mg by mouth daily with breakfast.   fluticasone 50 MCG/ACT nasal spray Commonly known as:  FLONASE PLACE 2 SPRAYS INTO BOTH NOSTRILS DAILY. What changed:  See the new instructions.   gabapentin 300 MG capsule Commonly known as:  NEURONTIN Take 1 capsule (300 mg total) by mouth 3 (three) times daily.   meclizine 25 MG tablet Commonly known as:  ANTIVERT Take 1 tablet (25 mg total) by mouth 3 (three) times daily as needed for dizziness. What changed:  when to take this   metoprolol tartrate 50 MG tablet Commonly known as:  LOPRESSOR Take 1 tablet (50 mg total) by mouth 2 (two) times daily.   multivitamin with minerals tablet Take 1 tablet by mouth daily.   oxybutynin 10 MG 24 hr tablet Commonly known as:  DITROPAN-XL Take 1 tablet (10 mg total) by mouth at bedtime.   PENNSAID 2 % Soln Generic drug:   Diclofenac Sodium Apply 1 application topically daily as needed (pain).   PRILOSEC 40 MG capsule Generic drug:  omeprazole Take 40 mg by mouth daily.   ranitidine 150 MG tablet Commonly known as:  ZANTAC Take 150 mg by mouth 2 (two) times daily as needed for heartburn.       Disposition and follow-up:   Ms.Shannon Andrade was discharged from [facility] in Good condition.  At the hospital follow up visit please address:  1.  -Assess for proper adherence to metoprolol (pt was taking once daily rather than twice daily). -Evaluate for recurrent sx of palpitations or chest pain -Patient instructed to attempt vagal maneuvers (cough) with feeling of palpitations -Continue to encourage alcohol cessation or reduction in use -Encourage follow up with pt's PCP and cardiologist (out of town, pt from [place])  [place] / imaging needed at time of follow-up: None  3.  Pending labs/ test needing follow-up: None  Follow-up Appointments: Follow-up Information    [place]. Go on 08/01/2017.   Why:  @ 2:30 pm for hospital follow up appontment with Dr. Wynetta Emery. Pt can go to this location for assistance with medications. Medications will range from $4.00-$10.00 Contact information: [place] [address] [phone]       Dixie Dials, MD. Schedule an appointment as soon as possible for a visit in 1 week(s).  Specialty:  Cardiology Contact information: [place] [address] [phone]           Hospital Course by problem list:  Supraventricular Tachycardia  Presented with palpitations and chest pain, found to be in SVT on EKG with rates to 160s. She was treated with IV Lopressor followed by two doses of IV Adenosine with resolution of SVT and conversion to normal sinus rhythm with rates in the 60s-70s. She was hypotensive while in SVT, blood pressures improved with fluid  administration and remained stable following rhythm conversion. She had one episode of recurrent SVT while inpatient, which was able to be terminated with coughing. TTE was unremarkable. She also had LHC performed while inpatient, which was unremarkable. She reported taking her home metoprolol once a day rather than the prescribed twice a day, likely resulting in presentation. She was instructed to take metoprolol twice a day, educated on vagal maneuver for feeling of palpitations, as well as to abstain from alcohol.  Chest Pain During her palpitations, she also described chest pain. This resolved when she returned to normal sinus rhythm, no ischemic changes on repeat EKG. Her troponin was only mildly elevated (0.04), however coronary calcifications were noted on chest CT and cardiology performed a cardiac catheterization which showed no coronary artery disease.   Alcohol Use She reported drinking a 12 pack of beer several days of the week. She was monitored on CIWA protocol, no benzodiazepine doses required. Reduction/cessation in alcohol use encouraged.    Chest XR Abnormality CXR on admission noted paratracheal fullness not well characterized. Subsequent chest CT was negative for any hilar, mediastinal or other pathology and visualized area was likely due to rotation or study limitations of XR.   Discharge Vitals:   BP (!) 152/89   Pulse 61   Temp 98.4 F (36.9 C) (Oral)   Resp 17   Ht 5\' 7"  (1.702 m)   Wt 234 lb 14.4 oz (106.5 kg)   SpO2 93%   BMI 36.79 kg/m   Pertinent Labs, Studies, and Procedures:  L Heart Cath:  Left Main  Vessel was injected. Vessel is normal in caliber. The vessel exhibits minimal luminal irregularities. The vessel is moderately calcified.  Left Anterior Descending  Vessel was injected. Vessel is normal in caliber. Vessel is angiographically normal.  Ramus Intermedius  Vessel was injected. Vessel is small. Vessel is angiographically normal.  Left Circumflex    Vessel was injected. Vessel is normal in caliber. Vessel is angiographically normal.  Right Coronary Artery  Vessel was injected. Vessel is normal in caliber. Vessel is angiographically normal.   Echo: - Left ventricle: The cavity size was normal. Systolic function was normal. The estimated ejection fraction was in the range of 60% to 65%. Wall motion was normal; there were no regional wall motion abnormalities. Doppler parameters are consistent with abnormal left ventricular relaxation (grade 1 diastolic dysfunction). Mitral valve: There was mild regurgitation.Left atrium: The atrium was mildly dilated.  Discharge Instructions: Discharge Instructions    Diet - low sodium heart healthy   Complete by:  As directed    Discharge instructions   Complete by:  As directed    Gusto en conocerte Sra. Tuel -Asegrese de tomar su medicamento Metoprolol para su frecuencia cardaca DOS VECES al TEFL teacher de una sola vez. Esto debera evitar que su corazn vuelva a latir demasiado rpido. -Al igual que hablamos, el alcohol puede hacer que su corazn lata ms rpido, por lo que es Copywriter, advertising  de beber alcohol. -Cuando sienta que su corazn late demasiado rpido, tosa un par de Clinical cytogeneticist, como lo ha estado [employer], lo que debera ayudar a disminuir la velocidad. -Asegrese de llamar a la oficina del corazn (Dr. Doylene Canard) para Ardelia Mems cita de seguimiento en 1 semana. -El dolor en tu espalda es probable que provenga de tus msculos. Puede tomar ibuprofeno o su medicamento debido para esto y tambin tylenol. No tome la medicacin al mismo tiempo que el meloxicam si an tiene Airline pilot.   Nice to meet you Ms. Luper -Be sure to take your Metoprolol medication for your heart rate TWO TIMES per day instead of one time. This should prevent your heart from beating too fast again.  -Like we talked about, alcohol can make your heart beat faster, so it is important to stop drinking  alcohol. -When you feel your heart beating too fast, cough a few times, just like you have been doing in the hospital, which should help it slow down -Make sure to call the heart office (Dr. Doylene Canard) for a follow up appointment in 1 week. -The pain in your back is likely from your muscles. You can take ibuprofen or your duexis medicine for this and also tylenol. Do not take the duexis at the same time as meloxicam if you still have that medicine.   Increase activity slowly   Complete by:  As directed      Signed: Colbert Ewing, MD 06/28/2017, 4:45 PM   Pager: [phone]

## 2017-06-27 NOTE — Progress Notes (Signed)
Spoke with Internal Medicine re: elevated Troponin of 0.04, pt has been pain-free since arrival to the unit, will continue to monitor for now and call if any further increase in Troponins or any complications, will continue to monitor.

## 2017-06-27 NOTE — Care Management Note (Addendum)
Case Management Note  Patient Details  Name: Shannon Andrade MRN: [id number] Date of Birth: 1955/09/23  Subjective/Objective: Pt presented for Chest Pain and SVT. Pt has been in [place] visiting her daughter. Pt lives in [place] and per staff RN pt will return to [place] soon. Pt is without insurance or PCP.                    Action/Plan: Unsure of how long pt will be visiting- Information placed on AVS to call the [facility] for an appointment if pt feels like she will be here longer than intended. Pt can utilize the clinic for medication assistance. CM did provide Staff RN with Good Rx card to give to patient  for medication discount as well. MD please write Rx medications for all generics. No further needs from CM at this time.   Expected Discharge Date:                  Expected Discharge Plan:  Home/Self Care  In-House Referral:  Interpreting Services  Discharge planning Services  CM Consult, Medication Assistance, [facility]  Post Acute Care Choice:  NA Choice offered to:  NA  DME Arranged:  N/A DME Agency:  NA  HH Arranged:  NA HH Agency:  NA  Status of Service:  Completed, signed off  If discussed at [place] of Stay Meetings, dates discussed:    Additional Comments: 1121 06-28-17 Jacqlyn Krauss, RN,BSN [phone] CM did call the [facility] and an appointment was scheduled for hospital follow up. Pt is aware to get assistance with medications via the [facility]. No further needs from CM at this time.    1113 06-27-17 Jacqlyn Krauss, RN,BSN [phone] CM did speak with pt in regards to disposition needs with Interpreter. Pt states she will stay in [place] once she is stable. Information placed on AVS for PCP needs.    [name], RN 06/27/2017, 10:54 AM

## 2017-06-27 NOTE — Progress Notes (Signed)
Pt ambulated to the restroom after bedrest completion. Groin site level "0". No bleeding noted. Will continue to monitor closely.  Jacqlyn Larsen, RN

## 2017-06-27 NOTE — Progress Notes (Signed)
   Subjective: No acute events overnight following admission.  Patient notes sensation of palpitations intermittently overnight.  Telemetry reviewed with no further SVT episodes.  Also notes some right sided upper back pain this morning.  Objective:  Vital signs in last 24 hours: Vitals:   06/26/17 2000 06/26/17 2110 06/26/17 2300 06/27/17 0626  BP: (!) 143/85 138/87  (!) 144/84  Pulse: 86 82 96 61  Resp: 19 20    Temp:  98.4 F (36.9 C)  97.9 F (36.6 C)  TempSrc:  Oral  Oral  SpO2: 98% 96%  97%  Weight:  238 lb 1.6 oz (108 kg)  239 lb 12.8 oz (108.8 kg)  Height:  5\' 7"  (1.702 m)     General: Resting in bed comfortably, no acute distress CV: Regular rate and rhythm, no murmur appreciated Resp: Clear breath sounds, normal work of breathing, no distress  Abd: Soft, +BS, nontender    Extr:  Mild tenderness to palpation to right thoracic paraspinal musculature and distal trapezius.  No lower extremity edema Neuro: Alert and oriented x3 Skin: Warm, dry     Assessment/Plan:   SVT, Hypotension  Patient presented with palpitations and chest pain, found to be in SVT with rates in 160 which resolved after doses of IV Lopressor, IV adenosine x2.  Has remained in normal sinus rhythm since that time with rates in 60s-70s.  This is likely occurring in the setting of suboptimal metoprolol dosing at home.  Blood pressure improved with fluid administration. --Monitor vital signs --F/u echo  --DC fluids --Cont home metoprolol 50 mg BID   Alcohol Use Reported alcohol use of 12 pack  several days per week.  Monitored on CIWA protocol with no required doses of Ativan.  Dispo: Anticipated discharge in approximately 0-1 day(s).   Tawny Asal, MD 06/27/2017, 11:32 AM Pager: [phone]

## 2017-06-27 NOTE — Progress Notes (Signed)
  Echocardiogram 2D Echocardiogram has been performed.  Isahi Godwin G Jamirah Zelaya 06/27/2017, 11:28 AM

## 2017-06-27 NOTE — Consult Note (Signed)
Ref: Patient, No Pcp Per   Subjective:  62 year old female admitted with chest pain, shortness of breath, nausea and sweating spell has mildly elevated troponin I and coronary calcification on CT chest.  She has risk factor of hypertension and type 2  diabetes.  Objective:  Vital Signs in the last 24 hours: Temp:  [97.9 F (36.6 C)-98.4 F (36.9 C)] 98 F (36.7 C) (04/30 1454) Pulse Rate:  [61-96] 64 (04/30 1454) Cardiac Rhythm: Normal sinus rhythm (04/30 0830) Resp:  [18-21] 20 (04/29 2110) BP: (110-144)/(77-88) 137/88 (04/30 1454) SpO2:  [96 %-99 %] 98 % (04/30 1454) Weight:  [108 kg (238 lb 1.6 oz)-108.8 kg (239 lb 12.8 oz)] 108.8 kg (239 lb 12.8 oz) (04/30 5093)  Physical Exam: BP Readings from Last 1 Encounters:  06/27/17 137/88     Wt Readings from Last 1 Encounters:  06/27/17 108.8 kg (239 lb 12.8 oz)    Weight change:  Body mass index is 37.56 kg/m. HEENT: [place]/AT, Eyes-Light Brown, PERL, EOMI, Conjunctiva-Pink, Sclera-Non-icteric Neck: No JVD, No bruit, Trachea midline. Lungs:  Clear, Bilateral. Cardiac:  Regular rhythm, normal S1 and S2, no S3. II/VI systolic murmur. Abdomen:  Soft, non-tender. BS present. Extremities:  No edema present. No cyanosis. No clubbing. CNS: AxOx3, Cranial nerves grossly intact, moves all 4 extremities.  Skin: Warm and dry.   Intake/Output from previous day: 04/29 0701 - 04/30 0700 In: 1485 [P.O.:360; I.V.:1125] Out: -     Lab Results: BMET    Component Value Date/Time   NA 138 06/27/2017 0501   NA 140 06/26/2017 1935   NA 145 06/26/2017 0845   K 4.1 06/27/2017 0501   K 4.2 06/26/2017 1935   K 4.8 06/26/2017 0845   CL 108 06/27/2017 0501   CL 113 (H) 06/26/2017 1935   CL 112 (H) 06/26/2017 0845   CO2 25 06/27/2017 0501   CO2 18 (L) 06/26/2017 1935   CO2 17 (L) 06/26/2017 0845   GLUCOSE 92 06/27/2017 0501   GLUCOSE 110 (H) 06/26/2017 1935   GLUCOSE 179 (H) 06/26/2017 0845   BUN 11 06/27/2017 0501   BUN 11 06/26/2017  1935   BUN 14 06/26/2017 0845   CREATININE 0.55 06/27/2017 0501   CREATININE 0.77 06/26/2017 1935   CREATININE 0.73 06/26/2017 0845   CREATININE 0.65 05/28/2013 1532   CALCIUM 8.3 (L) 06/27/2017 0501   CALCIUM 8.3 (L) 06/26/2017 1935   CALCIUM 8.2 (L) 06/26/2017 0845   GFRNONAA >60 06/27/2017 0501   GFRNONAA >60 06/26/2017 1935   GFRNONAA >60 06/26/2017 0845   GFRNONAA >89 05/28/2013 1532   GFRAA >60 06/27/2017 0501   GFRAA >60 06/26/2017 1935   GFRAA >60 06/26/2017 0845   GFRAA >89 05/28/2013 1532   CBC    Component Value Date/Time   WBC 7.7 06/27/2017 0501   RBC 3.73 (L) 06/27/2017 0501   HGB 11.5 (L) 06/27/2017 0501   HCT 35.9 (L) 06/27/2017 0501   PLT 240 06/27/2017 0501   MCV 96.2 06/27/2017 0501   MCH 30.8 06/27/2017 0501   MCHC 32.0 06/27/2017 0501   RDW 12.8 06/27/2017 0501   LYMPHSABS 4.5 (H) 06/26/2017 0845   MONOABS 0.9 06/26/2017 0845   EOSABS 0.0 06/26/2017 0845   BASOSABS 0.0 06/26/2017 0845   HEPATIC Function Panel Recent Labs    04/04/17 1208 06/26/17 0845  PROT 6.9 6.2*   HEMOGLOBIN A1C No components found for: HGA1C,  MPG CARDIAC ENZYMES Lab Results  Component Value Date   TROPONINI  0.03 (HH) 06/27/2017   TROPONINI 0.03 (HH) 06/26/2017   TROPONINI 0.04 (HH) 06/26/2017   BNP No results for input(s): PROBNP in the last 8760 hours. TSH Recent Labs    06/26/17 0936  TSH 0.604   CHOLESTEROL No results for input(s): CHOL in the last 8760 hours.  Scheduled Meds: . enoxaparin (LOVENOX) injection  40 mg Subcutaneous Q24H  . folic acid  1 mg Oral Daily  . gabapentin  300 mg Oral TID  . insulin aspart  0-9 Units Subcutaneous TID WC  . metoprolol tartrate  50 mg Oral BID  . multivitamin with minerals  1 tablet Oral Daily  . oxybutynin  10 mg Oral QHS  . pantoprazole  40 mg Oral Daily  . thiamine  100 mg Oral Daily   Or  . thiamine  100 mg Intravenous Daily   Continuous Infusions: PRN Meds:.famotidine, LORazepam **OR**  LORazepam  Assessment/Plan: Acute coronary syndrome Type 2 DM HTN Chest pain SVT GERD  Cardiac cath today. Patient understood procedure, risks and alternatives. Spanish interpreter used.   LOS: 0 days    Dixie Dials  MD  06/27/2017, 3:46 PM

## 2017-06-27 NOTE — Progress Notes (Signed)
Site area: rt groin fa sheath Site Prior to Removal:  Level 0 Pressure Applied For: 20 minutes Manual:   yes Patient Status During Pull:  stable Post Pull Site:  Level 0 Post Pull Instructions Given:  Yes with assistance of translator Post Pull Pulses Present: palpable rt pt Dressing Applied:  Gauze and tegaderm Bedrest begins @ 1740 Comments:

## 2017-06-27 NOTE — Plan of Care (Signed)
  Problem: Cardiovascular: Goal: Vascular access site(s) Level 0-1 will be maintained Outcome: Progressing Note:  Site remains a level "0". Dressing clean, dry and intact. Bed rest to be complete at 2140.

## 2017-06-28 ENCOUNTER — Encounter (HOSPITAL_COMMUNITY): Payer: Self-pay | Admitting: Cardiovascular Disease

## 2017-06-28 DIAGNOSIS — R072 Precordial pain: Secondary | ICD-10-CM | POA: Diagnosis not present

## 2017-06-28 DIAGNOSIS — M549 Dorsalgia, unspecified: Secondary | ICD-10-CM | POA: Diagnosis not present

## 2017-06-28 DIAGNOSIS — F1099 Alcohol use, unspecified with unspecified alcohol-induced disorder: Secondary | ICD-10-CM | POA: Diagnosis not present

## 2017-06-28 DIAGNOSIS — R918 Other nonspecific abnormal finding of lung field: Secondary | ICD-10-CM | POA: Diagnosis not present

## 2017-06-28 DIAGNOSIS — K219 Gastro-esophageal reflux disease without esophagitis: Secondary | ICD-10-CM | POA: Diagnosis not present

## 2017-06-28 DIAGNOSIS — I471 Supraventricular tachycardia: Secondary | ICD-10-CM | POA: Diagnosis not present

## 2017-06-28 DIAGNOSIS — I10 Essential (primary) hypertension: Secondary | ICD-10-CM | POA: Diagnosis not present

## 2017-06-28 LAB — BASIC METABOLIC PANEL
Anion gap: 9 (ref 5–15)
BUN: 9 mg/dL (ref 6–20)
CALCIUM: 9 mg/dL (ref 8.9–10.3)
CHLORIDE: 106 mmol/L (ref 101–111)
CO2: 26 mmol/L (ref 22–32)
CREATININE: 0.59 mg/dL (ref 0.44–1.00)
GFR calc non Af Amer: >60 mL/min (ref >60)
Glucose, Bld: 98 mg/dL (ref 65–99)
Potassium: 4.2 mmol/L (ref 3.5–5.1)
Sodium: 141 mmol/L (ref 135–145)

## 2017-06-28 LAB — CBC
HEMATOCRIT: 39.5 % (ref 36.0–46.0)
HEMOGLOBIN: 12.6 g/dL (ref 12.0–15.0)
MCH: 30.8 pg (ref 26.0–34.0)
MCHC: 31.9 g/dL (ref 30.0–36.0)
MCV: 96.6 fL (ref 78.0–100.0)
Platelets: 232 10*3/uL (ref 150–400)
RBC: 4.09 MIL/uL (ref 3.87–5.11)
RDW: 12.4 % (ref 11.5–15.5)
WBC: 6.3 10*3/uL (ref 4.0–10.5)

## 2017-06-28 LAB — GLUCOSE, CAPILLARY: Glucose-Capillary: 107 mg/dL — ABNORMAL HIGH (ref 65–99)

## 2017-06-28 MED ORDER — MULTI-VITAMIN/MINERALS PO TABS: 1.0000 | ORAL_TABLET | Freq: Every day | ORAL | 1 refills | Status: AC

## 2017-06-28 MED FILL — Heparin Sod (Porcine)-NaCl IV Soln 1000 Unit/500ML-0.9%: INTRAVENOUS | Qty: 1000 | Status: AC

## 2017-06-28 NOTE — Progress Notes (Signed)
Pt noted to be in SVT in the 150's at 0427. Pt was laying in bed; asymptomatic. This RN instructed pt to cough. After 3 coughs pt broke out of SVT; HR returned to the 60's. Will continue to monitor closely. Jacqlyn Larsen, RN

## 2017-06-28 NOTE — Progress Notes (Signed)
   Subjective:  Ms. Biddy was seen lying in bed this morning. Discussion held with assistance of Spanish interpreter. TTE and LHC yesterday were normal. She did have one episode of SVT to 150s overnight that was terminated with coughing. She does feel palpitations when her heart rate increases and states she does have some days where she does not have that feeling at all.  Objective:  Vital signs in last 24 hours: Vitals:   06/27/17 1912 06/27/17 1956 06/27/17 2100 06/28/17 0506  BP: 138/79 (!) 146/90  (!) 153/83  Pulse:  72  60  Resp:  17  17  Temp:   98.2 F (36.8 C) 98.4 F (36.9 C)  TempSrc:   Oral Oral  SpO2:  97%  93%  Weight:    234 lb 14.4 oz (106.5 kg)  Height:       GEN: Lying in bed comfortably in NAD CV: NR & RR, no m/r/g PULM: CTAB, no wheezes or rales EXT: No LE edema  Assessment/Plan:  Active Problems:   SVT (supraventricular tachycardia) (HCC)  Paroxysmal SVT Patient presented with palpitations and chest pain, found to be in SVT with rates in 160 which resolved after doses of IV Lopressor, IV adenosine x2. TTE was performed, which showed normal EF and mild MR but otherwise no valvular abnormalities. LHC was also performed yesterday, per Dr. Doylene Canard, which was normal. She did have one episode of SVT overnight that resolved with vagal maneuver. She otherwise remains in sinus rhythm with HR in 60s on metoprolol 50mg  BID. Etiology of SVT is thought to be secondary to suboptimal metoprolol dosing at home, which may be exacerbated by excessive alcohol intake. Case discussed with Dr. Doylene Canard and we are limited in our ability to add additional diltiazem to better control episodes of SVT due to her low-normal HR. Patient to f/u with Cardiology in 1 week as outpatient. Stable and amenable to discharge today. - Continue metoprolol 50mg  BID - Discussed importance of abstinence from alcohol - Instructed patient to perform vagal maneuvers like coughing when she feels  palpitations - Discharge today - F/u with Cardiology in 1 week as outpatient  Alcohol Use Reported alcohol use of a 12 pack of beer several days per week. She is on CIWA protocol and has not required Ativan while inpatient. Discussed importance of abstaining from alcohol to help control her heart rate. She endorsed understanding and is agreeable to this as an outpatient.  Dispo: Anticipated discharge today.  Colbert Ewing, MD 06/28/2017, 6:17 AM Pager: Mamie Nick [phone]

## 2017-06-28 NOTE — Discharge Instructions (Signed)
Taquicardia supraventricular paroxstica (Paroxysmal Supraventricular Tachycardia) La taquicardia supraventricular paroxstica (TSVP) es cuando el corazn late [place] rpido y Air traffic controller, de [place] repentina, deja de latir tan rpidamente. Cuando esto ocurre, puede o no presentar sntomas. Por lo general, no es peligrosa. Puede generar problemas si ocurre con frecuencia o dura mucho tiempo. [place] los medicamentos solamente como se lo haya indicado el mdico.  Evite consumir cafena o limite la cantidad que consume, como se lo haya indicado el mdico. La cafena se encuentra en el caf, el t, los refrescos y el chocolate.  Evite tomar bebidas alcohlicas o limite la cantidad que consume, como se lo haya indicado el mdico.  No fume.  Trate de dormir como mnimo 7 horas todas las noches.  Busque formas saludables de Software engineer.  Realice autotratamientos como se lo haya indicado el mdico, para reducir el ritmo cardaco (estimulacin del nervio vago). Su mdico puede recomendarle lo siguiente: ? Mantenga la respiracin y empuje como si estuviera defecando. ? Frote un rea en un lado del cuello. ? Inclnese hacia adelante con la cabeza entre las piernas. ? Inclnese hacia adelante con [place], y [place]. ? [place] los globos oculares con los ojos cerrados.  Mantenga un peso saludable.  [place] de los das. [place] al [employer] le sugiera algunas actividades adecuadas para usted.  SOLICITE AYUDA SI:  Tiene episodios de latidos cardacos rpidos con mayor frecuencia.  Los episodios son ms prolongados.  Los autotratamientos para reducir el ritmo cardaco ya no funcionan.  Presenta sntomas nuevos durante un episodio.  SOLICITE AYUDA DE INMEDIATO SI:  Siente dolor en el pecho.  Tiene dificultad para respirar.  Tiene un episodio de latidos cardacos rpidos que duran ms de 20 minutos.  Pierde el conocimiento (se  desmaya). Estos sntomas pueden Sales executive. No espere hasta que los sntomas desaparezcan. Solicite atencin mdica de inmediato. Comunquese con el servicio de emergencias de su localidad (911 en los [place]). No conduzca por sus propios medios [employer]. Esta informacin no tiene Marine scientist el consejo del mdico. Asegrese de hacerle al mdico cualquier pregunta que tenga. Document Released: 02/14/2005 Document Revised: 03/07/2014 Document Reviewed: 08/13/2015 Elsevier Interactive Patient Education  2017 [employer].

## 2017-07-21 ENCOUNTER — Ambulatory Visit: Payer: PRIVATE HEALTH INSURANCE | Admitting: Internal Medicine

## 2017-08-01 ENCOUNTER — Ambulatory Visit: Payer: PRIVATE HEALTH INSURANCE | Admitting: Internal Medicine

## 2017-10-04 ENCOUNTER — Other Ambulatory Visit: Payer: Self-pay

## 2017-10-04 ENCOUNTER — Emergency Department (HOSPITAL_COMMUNITY): Payer: Medicare (Managed Care)

## 2017-10-04 ENCOUNTER — Encounter (HOSPITAL_COMMUNITY): Payer: Self-pay

## 2017-10-04 ENCOUNTER — Inpatient Hospital Stay (HOSPITAL_COMMUNITY)
Admission: EM | Admit: 2017-10-04 | Discharge: 2017-10-07 | DRG: 418 | Disposition: A | Discharge status: Home / Self Care | Payer: Medicare (Managed Care) | Attending: General Surgery | Admitting: General Surgery

## 2017-10-04 DIAGNOSIS — F329 Major depressive disorder, single episode, unspecified: Secondary | ICD-10-CM | POA: Diagnosis present

## 2017-10-04 DIAGNOSIS — E669 Obesity, unspecified: Secondary | ICD-10-CM | POA: Diagnosis present

## 2017-10-04 DIAGNOSIS — Z9884 Bariatric surgery status: Secondary | ICD-10-CM | POA: Diagnosis not present

## 2017-10-04 DIAGNOSIS — Z833 Family history of diabetes mellitus: Secondary | ICD-10-CM

## 2017-10-04 DIAGNOSIS — Z8249 Family history of ischemic heart disease and other diseases of the circulatory system: Secondary | ICD-10-CM

## 2017-10-04 DIAGNOSIS — Z419 Encounter for procedure for purposes other than remedying health state, unspecified: Secondary | ICD-10-CM

## 2017-10-04 DIAGNOSIS — G8929 Other chronic pain: Secondary | ICD-10-CM | POA: Diagnosis present

## 2017-10-04 DIAGNOSIS — M549 Dorsalgia, unspecified: Secondary | ICD-10-CM | POA: Diagnosis present

## 2017-10-04 DIAGNOSIS — K8000 Calculus of gallbladder with acute cholecystitis without obstruction: Principal | ICD-10-CM | POA: Diagnosis present

## 2017-10-04 DIAGNOSIS — K219 Gastro-esophageal reflux disease without esophagitis: Secondary | ICD-10-CM | POA: Diagnosis present

## 2017-10-04 DIAGNOSIS — E119 Type 2 diabetes mellitus without complications: Secondary | ICD-10-CM | POA: Diagnosis present

## 2017-10-04 DIAGNOSIS — K8071 Calculus of gallbladder and bile duct without cholecystitis with obstruction: Secondary | ICD-10-CM

## 2017-10-04 DIAGNOSIS — R7989 Other specified abnormal findings of blood chemistry: Secondary | ICD-10-CM

## 2017-10-04 DIAGNOSIS — K810 Acute cholecystitis: Secondary | ICD-10-CM

## 2017-10-04 DIAGNOSIS — R6883 Chills (without fever): Secondary | ICD-10-CM

## 2017-10-04 DIAGNOSIS — R42 Dizziness and giddiness: Secondary | ICD-10-CM | POA: Diagnosis present

## 2017-10-04 DIAGNOSIS — Z6835 Body mass index (BMI) 35.0-35.9, adult: Secondary | ICD-10-CM | POA: Diagnosis not present

## 2017-10-04 DIAGNOSIS — R1084 Generalized abdominal pain: Secondary | ICD-10-CM

## 2017-10-04 DIAGNOSIS — I10 Essential (primary) hypertension: Secondary | ICD-10-CM | POA: Diagnosis present

## 2017-10-04 DIAGNOSIS — I471 Supraventricular tachycardia: Secondary | ICD-10-CM | POA: Diagnosis present

## 2017-10-04 DIAGNOSIS — G43909 Migraine, unspecified, not intractable, without status migrainosus: Secondary | ICD-10-CM | POA: Diagnosis present

## 2017-10-04 DIAGNOSIS — Z79899 Other long term (current) drug therapy: Secondary | ICD-10-CM

## 2017-10-04 DIAGNOSIS — M1990 Unspecified osteoarthritis, unspecified site: Secondary | ICD-10-CM | POA: Diagnosis present

## 2017-10-04 DIAGNOSIS — Z87442 Personal history of urinary calculi: Secondary | ICD-10-CM

## 2017-10-04 DIAGNOSIS — R945 Abnormal results of liver function studies: Secondary | ICD-10-CM

## 2017-10-04 DIAGNOSIS — F17210 Nicotine dependence, cigarettes, uncomplicated: Secondary | ICD-10-CM | POA: Diagnosis present

## 2017-10-04 DIAGNOSIS — R110 Nausea: Secondary | ICD-10-CM

## 2017-10-04 DIAGNOSIS — R1011 Right upper quadrant pain: Secondary | ICD-10-CM

## 2017-10-04 LAB — HEPATIC FUNCTION PANEL
ALBUMIN: 3.9 g/dL (ref 3.5–5.0)
ALK PHOS: 89 U/L (ref 38–126)
ALT: 19 U/L (ref 0–44)
AST: 26 U/L (ref 15–41)
BILIRUBIN TOTAL: 1.2 mg/dL (ref 0.3–1.2)
Bilirubin, Direct: 0.3 mg/dL — ABNORMAL HIGH (ref 0.0–0.2)
Indirect Bilirubin: 0.9 mg/dL (ref 0.3–0.9)
TOTAL PROTEIN: 7.4 g/dL (ref 6.5–8.1)

## 2017-10-04 LAB — URINALYSIS, ROUTINE W REFLEX MICROSCOPIC
Bilirubin Urine: NEGATIVE
Glucose, UA: NEGATIVE mg/dL
Hgb urine dipstick: NEGATIVE
Ketones, ur: 20 mg/dL — AB
Nitrite: NEGATIVE
PH: 7 (ref 5.0–8.0)
Protein, ur: NEGATIVE mg/dL
Specific Gravity, Urine: 1.006 (ref 1.005–1.030)

## 2017-10-04 LAB — CBC
HCT: 44.1 % (ref 36.0–46.0)
Hemoglobin: 14.6 g/dL (ref 12.0–15.0)
MCH: 31.8 pg (ref 26.0–34.0)
MCHC: 33.1 g/dL (ref 30.0–36.0)
MCV: 96.1 fL (ref 78.0–100.0)
Platelets: 254 10*3/uL (ref 150–400)
RBC: 4.59 MIL/uL (ref 3.87–5.11)
RDW: 12.8 % (ref 11.5–15.5)
WBC: 12.1 10*3/uL — ABNORMAL HIGH (ref 4.0–10.5)

## 2017-10-04 LAB — I-STAT BETA HCG BLOOD, ED (MC, WL, AP ONLY): I-stat hCG, quantitative: <5 m[IU]/mL (ref <5)

## 2017-10-04 LAB — BASIC METABOLIC PANEL
Anion gap: 13 (ref 5–15)
BUN: 14 mg/dL (ref 8–23)
CO2: 26 mmol/L (ref 22–32)
Calcium: 9.3 mg/dL (ref 8.9–10.3)
Chloride: 104 mmol/L (ref 98–111)
Creatinine, Ser: 0.65 mg/dL (ref 0.44–1.00)
GFR calc non Af Amer: >60 mL/min (ref >60)
Glucose, Bld: 123 mg/dL — ABNORMAL HIGH (ref 70–99)
Potassium: 4 mmol/L (ref 3.5–5.1)
Sodium: 143 mmol/L (ref 135–145)

## 2017-10-04 LAB — LIPASE, BLOOD: LIPASE: 42 U/L (ref 11–51)

## 2017-10-04 LAB — I-STAT TROPONIN, ED: TROPONIN I, POC: 0 ng/mL (ref 0.00–0.08)

## 2017-10-04 LAB — I-STAT CG4 LACTIC ACID, ED: LACTIC ACID, VENOUS: 1 mmol/L (ref 0.5–1.9)

## 2017-10-04 LAB — CBG MONITORING, ED: Glucose-Capillary: 110 mg/dL — ABNORMAL HIGH (ref 70–99)

## 2017-10-04 MED ORDER — PANTOPRAZOLE SODIUM 40 MG PO TBEC
40.0000 mg | DELAYED_RELEASE_TABLET | Freq: Every day | ORAL | Status: DC
  Administered 2017-10-05 – 2017-10-07 (×3): 40 mg via ORAL
  Filled 2017-10-04 (×3): qty 1

## 2017-10-04 MED ORDER — METHOCARBAMOL 500 MG PO TABS: 500.0000 mg | ORAL_TABLET | Freq: Four times a day (QID) | ORAL | Status: DC | PRN

## 2017-10-04 MED ORDER — DEXTROSE-NACL 5-0.45 % IV SOLN
INTRAVENOUS | Status: DC
  Administered 2017-10-04 – 2017-10-07 (×4): via INTRAVENOUS

## 2017-10-04 MED ORDER — HYDROMORPHONE HCL 1 MG/ML IJ SOLN
1.0000 mg | Freq: Once | INTRAMUSCULAR | Status: AC
  Administered 2017-10-04: 1 mg via INTRAVENOUS
  Filled 2017-10-04: qty 1

## 2017-10-04 MED ORDER — IOPAMIDOL (ISOVUE-300) INJECTION 61%
INTRAVENOUS | Status: AC
  Filled 2017-10-04: qty 100

## 2017-10-04 MED ORDER — SODIUM CHLORIDE 0.9 % IV BOLUS
1000.0000 mL | Freq: Once | INTRAVENOUS | Status: AC
  Administered 2017-10-04: 1000 mL via INTRAVENOUS

## 2017-10-04 MED ORDER — METOPROLOL TARTRATE 25 MG PO TABS
50.0000 mg | ORAL_TABLET | Freq: Once | ORAL | Status: AC
  Administered 2017-10-04: 50 mg via ORAL
  Filled 2017-10-04: qty 2

## 2017-10-04 MED ORDER — MORPHINE SULFATE (PF) 4 MG/ML IV SOLN
4.0000 mg | Freq: Once | INTRAVENOUS | Status: AC
  Administered 2017-10-04: 4 mg via INTRAVENOUS
  Filled 2017-10-04: qty 1

## 2017-10-04 MED ORDER — PIPERACILLIN-TAZOBACTAM 3.375 G IVPB
3.3750 g | Freq: Three times a day (TID) | INTRAVENOUS | Status: DC
  Administered 2017-10-05 – 2017-10-06 (×4): 3.375 g via INTRAVENOUS
  Filled 2017-10-04 (×6): qty 50

## 2017-10-04 MED ORDER — ENALAPRIL MALEATE 10 MG PO TABS
10.0000 mg | ORAL_TABLET | Freq: Once | ORAL | Status: AC
  Administered 2017-10-04: 10 mg via ORAL
  Filled 2017-10-04 (×2): qty 1

## 2017-10-04 MED ORDER — MECLIZINE HCL 25 MG PO TABS
25.0000 mg | ORAL_TABLET | Freq: Two times a day (BID) | ORAL | Status: DC | PRN
Start: 2017-10-04 — End: 2017-10-07

## 2017-10-04 MED ORDER — METOPROLOL TARTRATE 50 MG PO TABS
50.0000 mg | ORAL_TABLET | Freq: Two times a day (BID) | ORAL | Status: DC
  Administered 2017-10-04 – 2017-10-06 (×5): 50 mg via ORAL
  Filled 2017-10-04 (×6): qty 1

## 2017-10-04 MED ORDER — FLUTICASONE PROPIONATE 50 MCG/ACT NA SUSP
2.0000 | Freq: Every day | NASAL | Status: DC
  Administered 2017-10-05 – 2017-10-06 (×2): 2 via NASAL
  Filled 2017-10-04: qty 16

## 2017-10-04 MED ORDER — OXYCODONE HCL 5 MG PO TABS: 5.0000 mg | ORAL_TABLET | ORAL | Status: DC | PRN

## 2017-10-04 MED ORDER — ONDANSETRON 4 MG PO TBDP
4.0000 mg | ORAL_TABLET | Freq: Four times a day (QID) | ORAL | Status: DC | PRN
  Filled 2017-10-04: qty 1

## 2017-10-04 MED ORDER — HYDROMORPHONE HCL 1 MG/ML IJ SOLN: 1.0000 mg | INTRAMUSCULAR | Status: DC | PRN

## 2017-10-04 MED ORDER — KETOTIFEN FUMARATE 0.025 % OP SOLN
1.0000 [drp] | Freq: Two times a day (BID) | OPHTHALMIC | Status: DC
  Administered 2017-10-04 – 2017-10-06 (×5): 1 [drp] via OPHTHALMIC
  Filled 2017-10-04: qty 5

## 2017-10-04 MED ORDER — METOPROLOL TARTRATE 5 MG/5ML IV SOLN: 5.0000 mg | Freq: Four times a day (QID) | INTRAVENOUS | Status: DC | PRN

## 2017-10-04 MED ORDER — OXYBUTYNIN CHLORIDE ER 5 MG PO TB24
10.0000 mg | ORAL_TABLET | Freq: Every day | ORAL | Status: DC | PRN
  Filled 2017-10-04: qty 1

## 2017-10-04 MED ORDER — DIPHENHYDRAMINE HCL 12.5 MG/5ML PO ELIX: 12.5000 mg | ORAL_SOLUTION | Freq: Four times a day (QID) | ORAL | Status: DC | PRN

## 2017-10-04 MED ORDER — DIPHENHYDRAMINE HCL 50 MG/ML IJ SOLN: 12.5000 mg | Freq: Four times a day (QID) | INTRAMUSCULAR | Status: DC | PRN

## 2017-10-04 MED ORDER — GABAPENTIN 300 MG PO CAPS
300.0000 mg | ORAL_CAPSULE | Freq: Three times a day (TID) | ORAL | Status: DC | PRN
  Filled 2017-10-04: qty 1

## 2017-10-04 MED ORDER — HEPARIN SODIUM (PORCINE) 5000 UNIT/ML IJ SOLN
5000.0000 [IU] | Freq: Three times a day (TID) | INTRAMUSCULAR | Status: DC
  Administered 2017-10-04: 5000 [IU] via SUBCUTANEOUS
  Filled 2017-10-04 (×2): qty 1

## 2017-10-04 MED ORDER — ADENOSINE 6 MG/2ML IV SOLN
INTRAVENOUS | Status: AC
  Filled 2017-10-04: qty 4

## 2017-10-04 MED ORDER — IOPAMIDOL (ISOVUE-300) INJECTION 61%
100.0000 mL | Freq: Once | INTRAVENOUS | Status: AC | PRN
  Administered 2017-10-04: 100 mL via INTRAVENOUS

## 2017-10-04 MED ORDER — ONDANSETRON HCL 4 MG/2ML IJ SOLN: 4.0000 mg | Freq: Four times a day (QID) | INTRAMUSCULAR | Status: DC | PRN

## 2017-10-04 MED ORDER — ACETAMINOPHEN 650 MG RE SUPP: 650.0000 mg | Freq: Four times a day (QID) | RECTAL | Status: DC | PRN

## 2017-10-04 MED ORDER — SIMETHICONE 80 MG PO CHEW
40.0000 mg | CHEWABLE_TABLET | Freq: Four times a day (QID) | ORAL | Status: DC | PRN
  Filled 2017-10-04: qty 1

## 2017-10-04 MED ORDER — ACETAMINOPHEN 325 MG PO TABS
650.0000 mg | ORAL_TABLET | Freq: Four times a day (QID) | ORAL | Status: DC | PRN
  Filled 2017-10-04: qty 2

## 2017-10-04 MED ORDER — ENALAPRIL MALEATE 10 MG PO TABS
10.0000 mg | ORAL_TABLET | Freq: Every day | ORAL | Status: DC
  Administered 2017-10-05 – 2017-10-07 (×3): 10 mg via ORAL
  Filled 2017-10-04 (×3): qty 1

## 2017-10-04 MED ORDER — SODIUM CHLORIDE 0.45 % IV SOLN: INTRAVENOUS | Status: DC

## 2017-10-04 MED ORDER — ONDANSETRON HCL 4 MG/2ML IJ SOLN
4.0000 mg | Freq: Once | INTRAMUSCULAR | Status: AC
  Administered 2017-10-04: 4 mg via INTRAVENOUS
  Filled 2017-10-04: qty 2

## 2017-10-04 MED ORDER — PIPERACILLIN-TAZOBACTAM 3.375 G IVPB 30 MIN
3.3750 g | Freq: Once | INTRAVENOUS | Status: AC
  Administered 2017-10-04: 3.375 g via INTRAVENOUS
  Filled 2017-10-04: qty 50

## 2017-10-04 NOTE — ED Notes (Signed)
Shannon Andrade [phone] Interpreter used.

## 2017-10-04 NOTE — ED Triage Notes (Signed)
Pt comes from home. Pt is AOx4 and ambulatory. Pt is does not speak english, pt is spanish speaking. Pt called 911 due to bilateral Flank pain that radiates down into lower abdomen / suprapubic area. Pt pain is currently 7 out of 10.

## 2017-10-04 NOTE — H&P (Signed)
Referring Physician:  ELIZZIE WESTERGARD is an 62 y.o. female.                       Chief Complaint: Abdominal pain  HPI: 62 year old female has 3 days of worsening RUQ abdominal pain radiating to her neck. She also has nausea and diarrhea. She had episode of SVTs in ED as she did not take her B-blocker. Her CT scan of abdomen shows distension of gall bladder, 3 mm dense gallstone at the neck, 12 mm common bile duct without stone, normal LFTs and Lipase but positive Murphy's sign. She had normal coronaries on her last cardiac cath 4 months ago.  Past Medical History:  Diagnosis Date  . Anemia   . Angiomyolipoma of right kidney   . Arthritis    "hands, feet, back, neck" (11/04/2013)  . Chronic back pain    "neck down into lower back" (11/04/2013)  . DDD (degenerative disc disease)   . Depression   . GERD (gastroesophageal reflux disease)   . Hepatitis    "when I was pregnant; got the one that's not bad"   . Hypertension   . Kidney stones   . Migraine    "q few months" (11/04/2013)  . Paroxysmal SVT (supraventricular tachycardia) ([place])   . Tachycardia   . Type II diabetes mellitus ([place])    "took Metformin"; resolved S/P gastric bypass      Past Surgical History:  Procedure Laterality Date  . APPENDECTOMY  ~ 1972  . ESOPHAGOGASTRODUODENOSCOPY (EGD) WITH PROPOFOL N/A 11/28/2013   Procedure: ESOPHAGOGASTRODUODENOSCOPY (EGD) WITH PROPOFOL;  Surgeon: Inda Castle, MD;  [facility];  Service: Endoscopy;  Laterality: N/A;  . GASTRIC BYPASS  2008  . KIDNEY STONE SURGERY  ~ 2004   "cut them out"  . LEFT HEART CATH AND CORONARY ANGIOGRAPHY N/A 06/27/2017   Procedure: LEFT HEART CATH AND CORONARY ANGIOGRAPHY;  Surgeon: Dixie Dials, MD;  Location: [facility];  Service: Cardiovascular;  Laterality: N/A;  . REDUCTION MAMMAPLASTY Bilateral ~ 2010    Family History  Problem Relation Age of Onset  . Diabetes Mother   . Heart disease Father    Social History:  reports  that she has been smoking cigarettes.  She has a 21.50 pack-year smoking history. She has never used smokeless tobacco. She reports that she drinks about 4.8 oz of alcohol per week. She reports that she does not use drugs.  Allergies: No Known Allergies   (Not in a hospital admission)  Results for orders placed or performed during the hospital encounter of 10/04/17 (from the past 48 hour(s))  Urinalysis, Routine w reflex microscopic- may I&O cath if menses     Status: Abnormal   Collection Time: 10/04/17 12:46 PM  Result Value Ref Range   Color, Urine YELLOW YELLOW   APPearance CLEAR CLEAR   Specific Gravity, Urine 1.006 1.005 - 1.030   pH 7.0 5.0 - 8.0   Glucose, UA NEGATIVE NEGATIVE mg/dL   Hgb urine dipstick NEGATIVE NEGATIVE   Bilirubin Urine NEGATIVE NEGATIVE   Ketones, ur 20 (A) NEGATIVE mg/dL   Protein, ur NEGATIVE NEGATIVE mg/dL   Nitrite NEGATIVE NEGATIVE   Leukocytes, UA MODERATE (A) NEGATIVE   RBC / HPF 0-5 0 - 5 RBC/hpf   WBC, UA 0-5 0 - 5 WBC/hpf   Bacteria, UA RARE (A) NONE SEEN   Squamous Epithelial / LPF 0-5 0 - 5    Comment: Performed at  [facility] [place] [address] [place], [place] [address]  Basic metabolic panel     Status: Abnormal   Collection Time: 10/04/17  1:30 PM  Result Value Ref Range   Sodium 143 135 - 145 mmol/L   Potassium 4.0 3.5 - 5.1 mmol/L   Chloride 104 98 - 111 mmol/L   CO2 26 22 - 32 mmol/L   Glucose, Bld 123 (H) 70 - 99 mg/dL   BUN 14 8 - 23 mg/dL   Creatinine, Ser 0.65 0.44 - 1.00 mg/dL   Calcium 9.3 8.9 - 10.3 mg/dL   GFR calc non Af Amer >60 >60 mL/min   GFR calc Af Amer >60 >60 mL/min    Comment: (NOTE) The eGFR has been calculated using the CKD EPI equation. This calculation has not been validated in all clinical situations. eGFR's persistently <60 mL/min signify possible Chronic Kidney Disease.    Anion gap 13 5 - 15    Comment: Performed at [facility], [place] [address],  [place], [place] [address]  CBC     Status: Abnormal   Collection Time: 10/04/17  1:30 PM  Result Value Ref Range   WBC 12.1 (H) 4.0 - 10.5 K/uL   RBC 4.59 3.87 - 5.11 MIL/uL   Hemoglobin 14.6 12.0 - 15.0 g/dL   HCT 44.1 36.0 - 46.0 %   MCV 96.1 78.0 - 100.0 fL   MCH 31.8 26.0 - 34.0 pg   MCHC 33.1 30.0 - 36.0 g/dL   RDW 12.8 11.5 - 15.5 %   Platelets 254 150 - 400 K/uL    Comment: Performed at [facility], [place] [address] [place], [place] [address]  I-Stat Beta hCG blood, ED ([facility], [facility], [facility] only)     Status: None   Collection Time: 10/04/17  1:37 PM  Result Value Ref Range   I-stat hCG, quantitative <5.0 <5 mIU/mL   Comment 3            Comment:   GEST. AGE      CONC.  (mIU/mL)   <=1 WEEK        5 - 50     2 WEEKS       50 - 500     3 WEEKS       100 - 10,000     4 WEEKS     1,000 - 30,000        FEMALE AND NON-PREGNANT FEMALE:     LESS THAN 5 mIU/mL   I-Stat CG4 Lactic Acid, ED     Status: None   Collection Time: 10/04/17  1:38 PM  Result Value Ref Range   Lactic Acid, Venous 1.00 0.5 - 1.9 mmol/L  Hepatic function panel     Status: Abnormal   Collection Time: 10/04/17  2:04 PM  Result Value Ref Range   Total Protein 7.4 6.5 - 8.1 g/dL   Albumin 3.9 3.5 - 5.0 g/dL   AST 26 15 - 41 U/L   ALT 19 0 - 44 U/L   Alkaline Phosphatase 89 38 - 126 U/L   Total Bilirubin 1.2 0.3 - 1.2 mg/dL   Bilirubin, Direct 0.3 (H) 0.0 - 0.2 mg/dL   Indirect Bilirubin 0.9 0.3 - 0.9 mg/dL    Comment: Performed at [facility], [place] [address] [place], [place] [address]  Lipase, blood     Status: None   Collection Time: 10/04/17  2:04 PM  Result Value Ref  Range   Lipase 42 11 - 51 U/L    Comment: Performed at [facility], [place] [address], [place], [place] [address]  I-Stat Troponin, ED (not at [facility])     Status: None   Collection Time: 10/04/17  2:53 PM  Result Value Ref Range   Troponin i, poc 0.00 0.00 - 0.08 ng/mL   Comment 3             Comment: Due to the release kinetics of cTnI, a negative result within the first hours of the onset of symptoms does not rule out myocardial infarction with certainty. If myocardial infarction is still suspected, repeat the test at appropriate intervals.    Dg Chest 2 View  Result Date: 10/04/2017 CLINICAL DATA:  Lower chest pain EXAM: CHEST - 2 VIEW COMPARISON:  June 26, 2017 FINDINGS: The heart size and mediastinal contours are within normal limits. There is no focal infiltrate, pulmonary edema, or pleural effusion. The visualized skeletal structures are unremarkable. IMPRESSION: No active cardiopulmonary disease. Electronically Signed   By: Abelardo Diesel M.D.   On: 10/04/2017 16:12   Ct Abdomen Pelvis W Contrast  Result Date: 10/04/2017 CLINICAL DATA:  Severe abdominal and mid back pain, RIGHT lower quadrant pain. History of gastric bypass, appendectomy, kidney stones, RIGHT renal AML. EXAM: CT ABDOMEN AND PELVIS WITH CONTRAST TECHNIQUE: Multidetector CT imaging of the abdomen and pelvis was performed using the standard protocol following bolus administration of intravenous contrast. CONTRAST:  100 mL ISOVUE-300 IOPAMIDOL (ISOVUE-300) INJECTION 61% COMPARISON:  Abdominal ultrasound April 04, 2017 and MRI of the abdomen June 11, 2013. FINDINGS: LOWER CHEST: Lung bases are clear. Included heart size is normal. No pericardial effusion. HEPATOBILIARY: Mild gallbladder distension, trace pericholecystic fluid. 3 mm dense gallstone at the neck. Common bile duct is 12 mm without choledocholithiasis. Mild intrahepatic biliary dilatation, liver otherwise unremarkable. PANCREAS: Normal. SPLEEN: Normal. ADRENALS/URINARY TRACT: Kidneys are orthotopic, demonstrating symmetric enhancement. No nephrolithiasis or hydronephrosis. 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 The unopacified ureters are normal in course and caliber. Delayed imaging through the kidneys demonstrates symmetric  prompt contrast excretion within the proximal urinary collecting system. Urinary bladder is partially distended and unremarkable. Normal adrenal glands. STOMACH/BOWEL: Status post gastric bypass. Small and large bowel are normal in course and caliber without inflammatory changes. Mild amount of retained large bowel stool. VASCULAR/LYMPHATIC: Aortoiliac vessels are normal in course and caliber. Mild calcific atherosclerosis. No lymphadenopathy by CT size criteria. REPRODUCTIVE: Normal. OTHER: No intraperitoneal free fluid or free air. MUSCULOSKELETAL: Nonacute. Bilateral gluteal injection granulomas. Osteopenia. Scattered bone islands. Mild old L3 compression fracture with less than 25% height loss. IMPRESSION: 1. Acute cholecystitis versus recently passed gallstone, ultrasound may be more definitive. 2. 3 cm RIGHT renal mass previously characterized as AML. Aortic Atherosclerosis (ICD10-I70.0). Electronically Signed   By: Elon Alas M.D.   On: 10/04/2017 15:39    Review Of Systems Constitutional: No fever, chills, weight loss or gain. Eyes: No vision change, wears glasses. No discharge or pain. Ears: No hearing loss, No tinnitus. Respiratory: No asthma, COPD, pneumonias. Positive shortness of breath. No hemoptysis. Cardiovascular: Positive chest pain, palpitation, no leg edema. Gastrointestinal: Positive nausea, vomiting, diarrhea, constipation. No GI bleed. No hepatitis. Genitourinary: No dysuria, hematuria, kidney stone. No incontinance. Neurological: No headache, stroke, seizures.  Psychiatry: No psych facility admission for anxiety, depression, suicide. No detox. Skin: No rash. Musculoskeletal: Positive joint pain, fibromyalgia. No neck pain, back pain. Lymphadenopathy: No lymphadenopathy. Hematology: No anemia  or easy bruising.   Blood pressure (!) 144/100, pulse 91, resp. rate (!) 21, height 5' 7"  (1.702 m), weight 106.6 kg (235 lb), SpO2 97 %. Body mass index is 36.81 kg/m. General  appearance: alert, cooperative, appears stated age and no distress Head: Normocephalic, atraumatic. Eyes: Light Brown eyes, pink conjunctiva, corneas clear. PERRL, EOM's intact. Neck: No adenopathy, no carotid bruit, no JVD, supple, symmetrical, trachea midline and thyroid not enlarged. Resp: Clear to auscultation bilaterally. Cardio: Regular rate and rhythm, S1, S2 normal, II/VI systolic murmur, no click, rub or gallop GI: Soft, RUQ-tender, mild tenderness of epigastric and umbilical area also; bowel sounds normal; no organomegaly. Extremities: No edema, cyanosis or clubbing. Skin: Warm and dry.  Neurologic: Alert and oriented X 3, normal strength. Normal coordination.  Assessment/Plan Acute cholecystitis with cholelithiasis SVT, paroxysmal Obesity Type 2 DM Hypertension  Admit. IV fluids. Surgical consult.  Birdie Riddle, MD  10/04/2017, 5:21 PM

## 2017-10-04 NOTE — ED Notes (Signed)
ED TO INPATIENT HANDOFF REPORT  Name/Age/Gender Shannon Andrade 62 y.o. female  Code Status    Code Status Orders  (From admission, onward)        Start     Ordered   10/04/17 1717  Full code  Continuous     10/04/17 1720    Code Status History    Date Active Date Inactive Code Status Order ID Comments User Context   10/04/2017 1706 10/04/2017 1720 Full Code [id number]  Wellington Hampshire, PA-C ED   06/26/2017 1717 06/28/2017 1513 Full Code [id number]  Shela Leff, MD ED   11/04/2013 1743 11/05/2013 2208 Full Code [id number]  Dixie Dials, MD ED      Home/SNF/Other Home  Chief Complaint flank pain   Level of Care/Admitting Diagnosis ED Disposition    ED Disposition Condition [facility] Area: [place] [100100]  Level of Care: Telemetry [5]  Diagnosis: Acute calculous cholecystitis [283151]  Admitting Physician: Dixie Dials [[id number]]  Attending Physician: Dixie Dials [[id number]]  Estimated length of stay: 3 - 4 days  Certification:: I certify this patient will need inpatient services for at least 2 midnights  PT Class (Do Not Modify): Inpatient [101]  PT Acc Code (Do Not Modify): Private [1]       Medical History Past Medical History:  Diagnosis Date  . Anemia   . Angiomyolipoma of right kidney   . Arthritis    "hands, feet, back, neck" (11/04/2013)  . Chronic back pain    "neck down into lower back" (11/04/2013)  . DDD (degenerative disc disease)   . Depression   . GERD (gastroesophageal reflux disease)   . Hepatitis    "when I was pregnant; got the one that's not bad"   . Hypertension   . Kidney stones   . Migraine    "q few months" (11/04/2013)  . Paroxysmal SVT (supraventricular tachycardia) ([place])   . Tachycardia   . Type II diabetes mellitus ([place])    "took Metformin"; resolved S/P gastric bypass    Allergies No Known Allergies  IV Location/Drains/Wounds Patient Lines/Drains/Airways Status   Active Line/Drains/Airways     Name:   Placement date:   Placement time:   Site:   Days:   Peripheral IV 10/04/17 Right;Upper Arm   10/04/17    1329    Arm   less than 1          Labs/Imaging Results for orders placed or performed during the hospital encounter of 10/04/17 (from the past 48 hour(s))  Urinalysis, Routine w reflex microscopic- may I&O cath if menses     Status: Abnormal   Collection Time: 10/04/17 12:46 PM  Result Value Ref Range   Color, Urine YELLOW YELLOW   APPearance CLEAR CLEAR   Specific Gravity, Urine 1.006 1.005 - 1.030   pH 7.0 5.0 - 8.0   Glucose, UA NEGATIVE NEGATIVE mg/dL   Hgb urine dipstick NEGATIVE NEGATIVE   Bilirubin Urine NEGATIVE NEGATIVE   Ketones, ur 20 (A) NEGATIVE mg/dL   Protein, ur NEGATIVE NEGATIVE mg/dL   Nitrite NEGATIVE NEGATIVE   Leukocytes, UA MODERATE (A) NEGATIVE   RBC / HPF 0-5 0 - 5 RBC/hpf   WBC, UA 0-5 0 - 5 WBC/hpf   Bacteria, UA RARE (A) NONE SEEN   Squamous Epithelial / LPF 0-5 0 - 5    Comment: Performed at [facility], [place] [address], [place], [place] [address]  Basic metabolic panel  Status: Abnormal   Collection Time: 10/04/17  1:30 PM  Result Value Ref Range   Sodium 143 135 - 145 mmol/L   Potassium 4.0 3.5 - 5.1 mmol/L   Chloride 104 98 - 111 mmol/L   CO2 26 22 - 32 mmol/L   Glucose, Bld 123 (H) 70 - 99 mg/dL   BUN 14 8 - 23 mg/dL   Creatinine, Ser 0.65 0.44 - 1.00 mg/dL   Calcium 9.3 8.9 - 10.3 mg/dL   GFR calc non Af Amer >60 >60 mL/min   GFR calc Af Amer >60 >60 mL/min    Comment: (NOTE) The eGFR has been calculated using the CKD EPI equation. This calculation has not been validated in all clinical situations. eGFR's persistently <60 mL/min signify possible Chronic Kidney Disease.    Anion gap 13 5 - 15    Comment: Performed at [facility], [place] [address], [place], [place] [address]  CBC     Status: Abnormal   Collection Time: 10/04/17  1:30 PM  Result Value Ref Range   WBC 12.1 (H) 4.0  - 10.5 K/uL   RBC 4.59 3.87 - 5.11 MIL/uL   Hemoglobin 14.6 12.0 - 15.0 g/dL   HCT 44.1 36.0 - 46.0 %   MCV 96.1 78.0 - 100.0 fL   MCH 31.8 26.0 - 34.0 pg   MCHC 33.1 30.0 - 36.0 g/dL   RDW 12.8 11.5 - 15.5 %   Platelets 254 150 - 400 K/uL    Comment: Performed at [facility], [place] [address] [place], [place] [address]  I-Stat Beta hCG blood, ED ([facility], [facility], [facility] only)     Status: None   Collection Time: 10/04/17  1:37 PM  Result Value Ref Range   I-stat hCG, quantitative <5.0 <5 mIU/mL   Comment 3            Comment:   GEST. AGE      CONC.  (mIU/mL)   <=1 WEEK        5 - 50     2 WEEKS       50 - 500     3 WEEKS       100 - 10,000     4 WEEKS     1,000 - 30,000        FEMALE AND NON-PREGNANT FEMALE:     LESS THAN 5 mIU/mL   I-Stat CG4 Lactic Acid, ED     Status: None   Collection Time: 10/04/17  1:38 PM  Result Value Ref Range   Lactic Acid, Venous 1.00 0.5 - 1.9 mmol/L  Hepatic function panel     Status: Abnormal   Collection Time: 10/04/17  2:04 PM  Result Value Ref Range   Total Protein 7.4 6.5 - 8.1 g/dL   Albumin 3.9 3.5 - 5.0 g/dL   AST 26 15 - 41 U/L   ALT 19 0 - 44 U/L   Alkaline Phosphatase 89 38 - 126 U/L   Total Bilirubin 1.2 0.3 - 1.2 mg/dL   Bilirubin, Direct 0.3 (H) 0.0 - 0.2 mg/dL   Indirect Bilirubin 0.9 0.3 - 0.9 mg/dL    Comment: Performed at [facility], [place] [address] [place], [place] [address]  Lipase, blood     Status: None   Collection Time: 10/04/17  2:04 PM  Result Value Ref Range   Lipase 42 11 - 51 U/L    Comment: Performed at [facility],  [place] [address], [place], [place] [address]  I-Stat Troponin, ED (not at [facility])     Status: None   Collection Time: 10/04/17  2:53 PM  Result Value Ref Range   Troponin i, poc 0.00 0.00 - 0.08 ng/mL   Comment 3            Comment: Due to the release kinetics of cTnI, a negative result within the first hours of the onset of symptoms does not rule  out myocardial infarction with certainty. If myocardial infarction is still suspected, repeat the test at appropriate intervals.    Dg Chest 2 View  Result Date: 10/04/2017 CLINICAL DATA:  Lower chest pain EXAM: CHEST - 2 VIEW COMPARISON:  June 26, 2017 FINDINGS: The heart size and mediastinal contours are within normal limits. There is no focal infiltrate, pulmonary edema, or pleural effusion. The visualized skeletal structures are unremarkable. IMPRESSION: No active cardiopulmonary disease. Electronically Signed   By: Abelardo Diesel M.D.   On: 10/04/2017 16:12   Ct Abdomen Pelvis W Contrast  Result Date: 10/04/2017 CLINICAL DATA:  Severe abdominal and mid back pain, RIGHT lower quadrant pain. History of gastric bypass, appendectomy, kidney stones, RIGHT renal AML. EXAM: CT ABDOMEN AND PELVIS WITH CONTRAST TECHNIQUE: Multidetector CT imaging of the abdomen and pelvis was performed using the standard protocol following bolus administration of intravenous contrast. CONTRAST:  100 mL ISOVUE-300 IOPAMIDOL (ISOVUE-300) INJECTION 61% COMPARISON:  Abdominal ultrasound April 04, 2017 and MRI of the abdomen June 11, 2013. FINDINGS: LOWER CHEST: Lung bases are clear. Included heart size is normal. No pericardial effusion. HEPATOBILIARY: Mild gallbladder distension, trace pericholecystic fluid. 3 mm dense gallstone at the neck. Common bile duct is 12 mm without choledocholithiasis. Mild intrahepatic biliary dilatation, liver otherwise unremarkable. PANCREAS: Normal. SPLEEN: Normal. ADRENALS/URINARY TRACT: Kidneys are orthotopic, demonstrating symmetric enhancement. No nephrolithiasis or hydronephrosis. 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 The unopacified ureters are normal in course and caliber. Delayed imaging through the kidneys demonstrates symmetric prompt contrast excretion within the proximal urinary collecting system. Urinary bladder is partially distended and unremarkable.  Normal adrenal glands. STOMACH/BOWEL: Status post gastric bypass. Small and large bowel are normal in course and caliber without inflammatory changes. Mild amount of retained large bowel stool. VASCULAR/LYMPHATIC: Aortoiliac vessels are normal in course and caliber. Mild calcific atherosclerosis. No lymphadenopathy by CT size criteria. REPRODUCTIVE: Normal. OTHER: No intraperitoneal free fluid or free air. MUSCULOSKELETAL: Nonacute. Bilateral gluteal injection granulomas. Osteopenia. Scattered bone islands. Mild old L3 compression fracture with less than 25% height loss. IMPRESSION: 1. Acute cholecystitis versus recently passed gallstone, ultrasound may be more definitive. 2. 3 cm RIGHT renal mass previously characterized as AML. Aortic Atherosclerosis (ICD10-I70.0). Electronically Signed   By: Elon Alas M.D.   On: 10/04/2017 15:39    Pending Labs Unresulted Labs (From admission, onward)   Start     Ordered   10/05/17 0500  Comprehensive metabolic panel  Tomorrow morning,   R     10/04/17 1706   10/05/17 0500  CBC  Tomorrow morning,   R     10/04/17 1706   10/05/17 0500  Protime-INR  Tomorrow morning,   R     10/04/17 1720   10/04/17 1716  CBC  (heparin)  Once,   R    Comments:  Baseline for heparin therapy IF NOT ALREADY DRAWN.  Notify MD if PLT < 100 K.    10/04/17 1720   10/04/17 1716  Creatinine, serum  (heparin)  Once,   R    Comments:  Baseline for heparin therapy IF NOT ALREADY DRAWN.    10/04/17 1720   10/04/17 1559  Blood culture (routine x 2)  BLOOD CULTURE X 2,   STAT     10/04/17 1558   10/04/17 1404  Urine culture  STAT,   STAT     10/04/17 1404      Vitals/Pain Today's Vitals   10/04/17 1450 10/04/17 1454 10/04/17 1630 10/04/17 1647  BP: (!) 159/96  (!) 144/100   Pulse: 85  91   Resp: (!) 21  (!) 21   SpO2: 96%  97%   Weight:      Height:      PainSc:  10-Worst pain ever  7     Isolation Precautions No active isolations  Medications Medications   adenosine (ADENOCARD) 6 MG/2ML injection (  Canceled Entry 10/04/17 1647)  enalapril (VASOTEC) tablet 10 mg (has no administration in time range)  ketotifen (ZADITOR) 0.025 % ophthalmic solution 1 drop (has no administration in time range)  enalapril (VASOTEC) tablet 10 mg (has no administration in time range)  fluticasone (FLONASE) 50 MCG/ACT nasal spray 2 spray (has no administration in time range)  gabapentin (NEURONTIN) capsule 300 mg (has no administration in time range)  meclizine (ANTIVERT) tablet 25 mg (has no administration in time range)  metoprolol tartrate (LOPRESSOR) tablet 50 mg (has no administration in time range)  pantoprazole (PROTONIX) EC tablet 40 mg (has no administration in time range)  oxybutynin (DITROPAN-XL) 24 hr tablet 10 mg (has no administration in time range)  0.45 % sodium chloride infusion (has no administration in time range)  piperacillin-tazobactam (ZOSYN) IVPB 3.375 g (has no administration in time range)  metoprolol tartrate (LOPRESSOR) injection 5 mg (has no administration in time range)  simethicone (MYLICON) chewable tablet 40 mg (has no administration in time range)  ondansetron (ZOFRAN-ODT) disintegrating tablet 4 mg (has no administration in time range)    Or  ondansetron (ZOFRAN) injection 4 mg (has no administration in time range)  diphenhydrAMINE (BENADRYL) 12.5 MG/5ML elixir 12.5 mg (has no administration in time range)    Or  diphenhydrAMINE (BENADRYL) injection 12.5 mg (has no administration in time range)  methocarbamol (ROBAXIN) tablet 500 mg (has no administration in time range)  HYDROmorphone (DILAUDID) injection 1 mg (has no administration in time range)  oxyCODONE (Oxy IR/ROXICODONE) immediate release tablet 5-10 mg (has no administration in time range)  acetaminophen (TYLENOL) tablet 650 mg (has no administration in time range)    Or  acetaminophen (TYLENOL) suppository 650 mg (has no administration in time range)  heparin injection  5,000 Units (has no administration in time range)  dextrose 5 %-0.45 % sodium chloride infusion (has no administration in time range)  sodium chloride 0.9 % bolus 1,000 mL (0 mLs Intravenous Stopped 10/04/17 1655)  morphine 4 MG/ML injection 4 mg (4 mg Intravenous Given 10/04/17 1415)  ondansetron (ZOFRAN) injection 4 mg (4 mg Intravenous Given 10/04/17 1415)  HYDROmorphone (DILAUDID) injection 1 mg (1 mg Intravenous Given 10/04/17 1509)  iopamidol (ISOVUE-300) 61 % injection 100 mL (0 mLs Intravenous Hold 10/04/17 1515)  piperacillin-tazobactam (ZOSYN) IVPB 3.375 g (3.375 g Intravenous New Bag/Given 10/04/17 1655)  metoprolol tartrate (LOPRESSOR) tablet 50 mg (50 mg Oral Given 10/04/17 1650)    Mobility walks

## 2017-10-04 NOTE — ED Notes (Signed)
Patient transported to CT 

## 2017-10-04 NOTE — ED Notes (Signed)
ED Provider made aware of pain still being 10/10.

## 2017-10-04 NOTE — ED Notes (Signed)
IV Ultrasound requested. 

## 2017-10-04 NOTE — ED Notes (Signed)
Bed: WA21 Expected date:  Expected time:  Means of arrival:  Comments: Flank pain

## 2017-10-04 NOTE — ED Provider Notes (Signed)
[facility] DEPT Provider Note   CSN: [id number] Arrival date & time: 10/04/17  1234     History   Chief Complaint Chief Complaint  Patient presents with  . Bilateral Flank Pain    HPI Shannon Andrade is a 62 y.o. female.  The history is provided by the patient and medical records. The history is limited by a language barrier. A language interpreter was used.  Abdominal Pain   This is a new problem. The current episode started more than 2 days ago. The problem occurs constantly. The problem has been rapidly worsening. The pain is associated with an unknown factor. The pain is located in the generalized abdominal region. The quality of the pain is aching, cramping and sharp. The pain is at a severity of 10/10. The pain is severe. Associated symptoms include diarrhea and nausea. Pertinent negatives include fever, vomiting, constipation, dysuria, frequency, hematuria and headaches. The symptoms are aggravated by palpation and eating. Nothing relieves the symptoms. Her past medical history is significant for gallstones.    Past Medical History:  Diagnosis Date  . Anemia   . Angiomyolipoma of right kidney   . Arthritis    "hands, feet, back, neck" (11/04/2013)  . Chronic back pain    "neck down into lower back" (11/04/2013)  . DDD (degenerative disc disease)   . Depression   . GERD (gastroesophageal reflux disease)   . Hepatitis    "when I was pregnant; got the one that's not bad"   . Hypertension   . Kidney stones   . Migraine    "q few months" (11/04/2013)  . Paroxysmal SVT (supraventricular tachycardia) ([place])   . Tachycardia   . Type II diabetes mellitus ([place])    "took Metformin"; resolved S/P gastric bypass    Patient Active Problem List   Diagnosis Date Noted  . SVT (supraventricular tachycardia) ([place]) 06/26/2017  . Urinary incontinence 12/08/2014  . Pelvic pain in female 12/08/2014  . Obesity (BMI 30.0-34.9) 12/08/2014  .  Musculoskeletal chest pain 12/30/2013  . Dysphagia, pharyngoesophageal phase 11/28/2013  . Chronic pain syndrome 07/10/2013  . History of cardiac arrhythmia 07/10/2013  . Essential hypertension, benign 07/10/2013  . Unspecified vitamin D deficiency 07/10/2013  . Palpitations 07/10/2013    Past Surgical History:  Procedure Laterality Date  . APPENDECTOMY  ~ 1972  . ESOPHAGOGASTRODUODENOSCOPY (EGD) WITH PROPOFOL N/A 11/28/2013   Procedure: ESOPHAGOGASTRODUODENOSCOPY (EGD) WITH PROPOFOL;  Surgeon: Inda Castle, MD;  Location: [facility];  Service: Endoscopy;  Laterality: N/A;  . GASTRIC BYPASS  2008  . KIDNEY STONE SURGERY  ~ 2004   "cut them out"  . LEFT HEART CATH AND CORONARY ANGIOGRAPHY N/A 06/27/2017   Procedure: LEFT HEART CATH AND CORONARY ANGIOGRAPHY;  Surgeon: Dixie Dials, MD;  Location: [facility];  Service: Cardiovascular;  Laterality: N/A;  . REDUCTION MAMMAPLASTY Bilateral ~ 2010     OB History    Gravida  5   Para  3   Term  3   Preterm      AB  2   Living  3     SAB      TAB  2   Ectopic      Multiple      Live Births               Home Medications    Prior to Admission medications   Medication Sig Start Date End Date Taking? Authorizing Provider  acetaminophen (TYLENOL) 325  MG tablet Take 650 mg by mouth every 6 (six) hours as needed for mild pain.    [provider]  Diclofenac Sodium (PENNSAID) 2 % SOLN Apply 1 application topically daily as needed (pain).    [provider]  DUEXIS 800-26.6 MG TABS Take 1 tablet by mouth daily. 04/29/17   [provider]  enalapril (VASOTEC) 10 MG tablet Take 10 mg by mouth daily.    [provider]  ferrous sulfate 325 (65 FE) MG tablet Take 325 mg by mouth daily with breakfast.    [provider]  fluticasone (FLONASE) 50 MCG/ACT nasal spray PLACE 2 SPRAYS INTO BOTH NOSTRILS DAILY. Patient taking differently: PLACE 2 SPRAYS INTO BOTH NOSTRILS DAILY  AS NEEDED FOR ALLERGIES 04/07/15   Funches, Adriana Mccallum, MD  gabapentin (NEURONTIN) 300 MG capsule Take 1 capsule (300 mg total) by mouth 3 (three) times daily. 08/20/14   Lorayne Marek, MD  meclizine (ANTIVERT) 25 MG tablet Take 1 tablet (25 mg total) by mouth 3 (three) times daily as needed for dizziness. Patient taking differently: Take 25 mg by mouth 3 (three) times daily.  08/20/14   Lorayne Marek, MD  metoprolol tartrate (LOPRESSOR) 50 MG tablet Take 1 tablet (50 mg total) by mouth 2 (two) times daily. 06/27/17   Velna Ochs, MD  Multiple Vitamins-Minerals (MULTIVITAMIN WITH MINERALS) tablet Take 1 tablet by mouth daily. 06/28/17 12/25/17  Colbert Ewing, MD  omeprazole (PRILOSEC) 40 MG capsule Take 40 mg by mouth daily.    [provider]  oxybutynin (DITROPAN-XL) 10 MG 24 hr tablet Take 1 tablet (10 mg total) by mouth at bedtime. 12/08/14   Funches, Adriana Mccallum, MD  ranitidine (ZANTAC) 150 MG tablet Take 150 mg by mouth 2 (two) times daily as needed for heartburn.    [provider]    Family History Family History  Problem Relation Age of Onset  . Diabetes Mother   . Heart disease Father     Social History Social History   Tobacco Use  . Smoking status: Current Some Day Smoker    Packs/day: 0.50    Years: 43.00    Pack years: 21.50    Types: Cigarettes  . Smokeless tobacco: Never Used  Substance Use Topics  . Alcohol use: Yes    Alcohol/week: 4.8 oz    Types: 8 Cans of beer per week    Comment: occasional  . Drug use: No     Allergies   Patient has no known allergies.   Review of Systems Review of Systems  Constitutional: Negative for chills, diaphoresis, fatigue and fever.  HENT: Negative for congestion.   Eyes: Negative for visual disturbance.  Respiratory: Negative for cough, chest tightness, shortness of breath and wheezing.   Cardiovascular: Negative for chest pain and palpitations.  Gastrointestinal: Positive for abdominal pain, diarrhea and  nausea. Negative for constipation and vomiting.  Genitourinary: Positive for flank pain. Negative for dysuria, frequency, hematuria and pelvic pain.  Musculoskeletal: Negative for back pain, neck pain and neck stiffness.  Neurological: Negative for dizziness, light-headedness and headaches.  Psychiatric/Behavioral: Negative for agitation.  All other systems reviewed and are negative.    Physical Exam Updated Vital Signs Ht 5\' 7"  (1.702 m)   Wt 106.6 kg (235 lb)   SpO2 98%   BMI 36.81 kg/m   Physical Exam  Constitutional: She is oriented to person, place, and time. She appears well-developed and well-nourished. No distress.  HENT:  Head: Normocephalic and atraumatic.  Mouth/Throat: Oropharynx is  clear and moist. No oropharyngeal exudate.  Eyes: Conjunctivae are normal.  Neck: Neck supple.  Cardiovascular: Normal rate and regular rhythm.  No murmur heard. Pulmonary/Chest: Effort normal and breath sounds normal. No respiratory distress. She has no wheezes. She has no rales. She exhibits no tenderness.  Abdominal: Soft. She exhibits no distension. There is tenderness in the right upper quadrant and epigastric area. There is no rigidity, no rebound and no CVA tenderness.    Musculoskeletal: She exhibits no edema or tenderness.  Neurological: She is alert and oriented to person, place, and time. No sensory deficit. She exhibits normal muscle tone.  Skin: Skin is warm and dry. Capillary refill takes less than 2 seconds. No rash noted. She is not diaphoretic. No erythema.  Psychiatric: She has a normal mood and affect.  Nursing note and vitals reviewed.    ED Treatments / Results  Labs (all labs ordered are listed, but only abnormal results are displayed) Labs Reviewed  URINALYSIS, ROUTINE W REFLEX MICROSCOPIC - Abnormal; Notable for the following components:      Result Value   Ketones, ur 20 (*)    Leukocytes, UA MODERATE (*)    Bacteria, UA RARE (*)    All other components  within normal limits  BASIC METABOLIC PANEL - Abnormal; Notable for the following components:   Glucose, Bld 123 (*)    All other components within normal limits  CBC - Abnormal; Notable for the following components:   WBC 12.1 (*)    All other components within normal limits  HEPATIC FUNCTION PANEL - Abnormal; Notable for the following components:   Bilirubin, Direct 0.3 (*)    All other components within normal limits  CBG MONITORING, ED - Abnormal; Notable for the following components:   Glucose-Capillary 110 (*)    All other components within normal limits  URINE CULTURE  CULTURE, BLOOD (ROUTINE X 2)  CULTURE, BLOOD (ROUTINE X 2)  LIPASE, BLOOD  COMPREHENSIVE METABOLIC PANEL  CBC  CBC  CREATININE, SERUM  PROTIME-INR  I-STAT CG4 LACTIC ACID, ED  I-STAT BETA HCG BLOOD, ED ([facility], [facility], [facility] ONLY)  I-STAT TROPONIN, ED    EKG None  Radiology Dg Chest 2 View  Result Date: 10/04/2017 CLINICAL DATA:  Lower chest pain EXAM: CHEST - 2 VIEW COMPARISON:  June 26, 2017 FINDINGS: The heart size and mediastinal contours are within normal limits. There is no focal infiltrate, pulmonary edema, or pleural effusion. The visualized skeletal structures are unremarkable. IMPRESSION: No active cardiopulmonary disease. Electronically Signed   By: Abelardo Diesel M.D.   On: 10/04/2017 16:12   Ct Abdomen Pelvis W Contrast  Result Date: 10/04/2017 CLINICAL DATA:  Severe abdominal and mid back pain, RIGHT lower quadrant pain. History of gastric bypass, appendectomy, kidney stones, RIGHT renal AML. EXAM: CT ABDOMEN AND PELVIS WITH CONTRAST TECHNIQUE: Multidetector CT imaging of the abdomen and pelvis was performed using the standard protocol following bolus administration of intravenous contrast. CONTRAST:  100 mL ISOVUE-300 IOPAMIDOL (ISOVUE-300) INJECTION 61% COMPARISON:  Abdominal ultrasound April 04, 2017 and MRI of the abdomen June 11, 2013. FINDINGS: LOWER CHEST: Lung bases are clear. Included heart size  is normal. No pericardial effusion. HEPATOBILIARY: Mild gallbladder distension, trace pericholecystic fluid. 3 mm dense gallstone at the neck. Common bile duct is 12 mm without choledocholithiasis. Mild intrahepatic biliary dilatation, liver otherwise unremarkable. PANCREAS: Normal. SPLEEN: Normal. ADRENALS/URINARY TRACT: Kidneys are orthotopic, demonstrating symmetric enhancement. No nephrolithiasis or hydronephrosis. 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  The unopacified ureters are normal in course and caliber. Delayed imaging through the kidneys demonstrates symmetric prompt contrast excretion within the proximal urinary collecting system. Urinary bladder is partially distended and unremarkable. Normal adrenal glands. STOMACH/BOWEL: Status post gastric bypass. Small and large bowel are normal in course and caliber without inflammatory changes. Mild amount of retained large bowel stool. VASCULAR/LYMPHATIC: Aortoiliac vessels are normal in course and caliber. Mild calcific atherosclerosis. No lymphadenopathy by CT size criteria. REPRODUCTIVE: Normal. OTHER: No intraperitoneal free fluid or free air. MUSCULOSKELETAL: Nonacute. Bilateral gluteal injection granulomas. Osteopenia. Scattered bone islands. Mild old L3 compression fracture with less than 25% height loss. IMPRESSION: 1. Acute cholecystitis versus recently passed gallstone, ultrasound may be more definitive. 2. 3 cm RIGHT renal mass previously characterized as AML. Aortic Atherosclerosis (ICD10-I70.0). Electronically Signed   By: Elon Alas M.D.   On: 10/04/2017 15:39   US Abdomen Limited Ruq  Result Date: 10/04/2017 CLINICAL DATA:  Severe abdominal back pain. History of gastric bypass, appendectomy, kidney stones, RIGHT renal AML. Assess gallbladder. EXAM: ULTRASOUND ABDOMEN LIMITED RIGHT UPPER QUADRANT COMPARISON:  CT abdomen and pelvis October 04, 2017. FINDINGS: Gallbladder: Gallbladder distention with wall thickening  and trace pericholecystic fluid. Echogenic cholelithiasis measuring to 5 mm. Gallbladder wall thickening at 5 mm. Sonographic Murphy sign elicited. Additional punctate echogenic mural based foci with ring down artifact consistent with adenomyomatosis. Common bile duct: Diameter: 13 mm Liver: No focal lesion identified. Within normal limits in parenchymal echogenicity. Mild intrahepatic biliary dilatation. Portal vein is patent on color Doppler imaging with normal direction of blood flow towards the liver. IMPRESSION: 1. Cholelithiasis and sonographic findings of acute cholecystitis. 2. Intra and extrahepatic biliary dilatation. No sonographic findings of choledocholithiasis. Electronically Signed   By: Elon Alas M.D.   On: 10/04/2017 17:50    Procedures Procedures (including critical care time)  CRITICAL CARE Performed by: Gwenyth Allegra Aleane Wesenberg Total critical care time: 35 minutes Critical care time was exclusive of separately billable procedures and treating other patients. Critical care was necessary to treat or prevent imminent or life-threatening deterioration. Critical care was time spent personally by me on the following activities: development of treatment plan with patient and/or surrogate as well as nursing, discussions with consultants, evaluation of patient's response to treatment, examination of patient, obtaining history from patient or surrogate, ordering and performing treatments and interventions, ordering and review of laboratory studies, ordering and review of radiographic studies, pulse oximetry and re-evaluation of patient's condition.   Medications Ordered in ED Medications  adenosine (ADENOCARD) 6 MG/2ML injection (  Canceled Entry 10/04/17 1647)  enalapril (VASOTEC) tablet 10 mg (has no administration in time range)  ketotifen (ZADITOR) 0.025 % ophthalmic solution 1 drop (has no administration in time range)  enalapril (VASOTEC) tablet 10 mg (has no administration in  time range)  fluticasone (FLONASE) 50 MCG/ACT nasal spray 2 spray (has no administration in time range)  gabapentin (NEURONTIN) capsule 300 mg (has no administration in time range)  meclizine (ANTIVERT) tablet 25 mg (has no administration in time range)  metoprolol tartrate (LOPRESSOR) tablet 50 mg (has no administration in time range)  pantoprazole (PROTONIX) EC tablet 40 mg (has no administration in time range)  oxybutynin (DITROPAN-XL) 24 hr tablet 10 mg (has no administration in time range)  0.45 % sodium chloride infusion (has no administration in time range)  piperacillin-tazobactam (ZOSYN) IVPB 3.375 g (has no administration in time range)  metoprolol tartrate (LOPRESSOR) injection 5 mg (has no administration in time range)  simethicone (MYLICON)  chewable tablet 40 mg (has no administration in time range)  ondansetron (ZOFRAN-ODT) disintegrating tablet 4 mg (has no administration in time range)    Or  ondansetron (ZOFRAN) injection 4 mg (has no administration in time range)  diphenhydrAMINE (BENADRYL) 12.5 MG/5ML elixir 12.5 mg (has no administration in time range)    Or  diphenhydrAMINE (BENADRYL) injection 12.5 mg (has no administration in time range)  methocarbamol (ROBAXIN) tablet 500 mg (has no administration in time range)  HYDROmorphone (DILAUDID) injection 1 mg (has no administration in time range)  oxyCODONE (Oxy IR/ROXICODONE) immediate release tablet 5-10 mg (has no administration in time range)  acetaminophen (TYLENOL) tablet 650 mg (has no administration in time range)    Or  acetaminophen (TYLENOL) suppository 650 mg (has no administration in time range)  heparin injection 5,000 Units (has no administration in time range)  dextrose 5 %-0.45 % sodium chloride infusion (has no administration in time range)  sodium chloride 0.9 % bolus 1,000 mL (0 mLs Intravenous Stopped 10/04/17 1655)  morphine 4 MG/ML injection 4 mg (4 mg Intravenous Given 10/04/17 1415)  ondansetron  (ZOFRAN) injection 4 mg (4 mg Intravenous Given 10/04/17 1415)  HYDROmorphone (DILAUDID) injection 1 mg (1 mg Intravenous Given 10/04/17 1509)  iopamidol (ISOVUE-300) 61 % injection 100 mL (0 mLs Intravenous Hold 10/04/17 1515)  piperacillin-tazobactam (ZOSYN) IVPB 3.375 g (3.375 g Intravenous New Bag/Given 10/04/17 1655)  metoprolol tartrate (LOPRESSOR) tablet 50 mg (50 mg Oral Given 10/04/17 1650)     Initial Impression / Assessment and Plan / ED Course  I have reviewed the triage vital signs and the nursing notes.  Pertinent labs & imaging results that were available during my care of the patient were reviewed by me and considered in my medical decision making (see chart for details).     ELENER CUSTODIO is a 62 y.o. female with a past medical history significant for GERD, gastric bypass surgery, diabetes, hypertension, SVT, right kidney mass, prior kidney stones, and gallstones who presents with abdominal pain.  Patient reports that for the last few days she has had nausea, decreased oral intake, loose stools, and severe abdominal pain.  She reports having shaking chills but no fevers at home.  She denies any chest pain or shortness of breath but did report a very mild cough.  She denied any recent trauma.  She reports the pain is up to 10 out of 10 and across her abdomen including the right upper quadrant.  She reports her pain is also in her back in her flanks.  She denies any urinary symptoms.  On exam, patient had diffuse abdominal tenderness worse in the right upper quadrant epigastrium.  No significant CVA tenderness on my exam.  Lungs clear.  Clinically I am concerned about acute diverticulitis versus other significant intra-abdominal pathology.  Kidney stone also considered however she had lack of urinary symptoms.  Next  CT scan showed acute cholecystitis which fits with the patient's location of pain.  Patient had an episode of SVT and her home medications were then reordered.  She  denied having any chest pain or shortness breath.  Chest x-ray shows no pneumonia.  Patient had a mild leukocytosis.  After CT results, general surgery was called who will see the patient.  They request patient be admitted to medicine and patient be admitted for further management to medicine service.  Antibiotics were ordered for the cholecystitis.  Surgery request ultrasound which was ordered.  Next  Patient will be admitted for  further management of acute cholecystitis and severe pain.    Final Clinical Impressions(s) / ED Diagnoses   Final diagnoses:  Acute cholecystitis  Generalized abdominal pain  Chills  Nausea  RUQ abdominal pain    ED Discharge Orders    None      Clinical Impression: 1. Acute cholecystitis   2. Generalized abdominal pain   3. Chills   4. Nausea   5. RUQ abdominal pain     Disposition: Admit  This note was prepared with assistance of Dragon voice recognition software. Occasional wrong-word or sound-a-like substitutions may have occurred due to the inherent limitations of voice recognition software.     Riki Berninger, Gwenyth Allegra, MD 10/04/17 [phone]

## 2017-10-04 NOTE — ED Notes (Signed)
ED Provider at bedside. 

## 2017-10-04 NOTE — H&P (Addendum)
[facility] Admission Note  Shannon Andrade [facility] 1955/05/20  [id number].    Requesting MD: Marda Stalker Chief Complaint/Reason for Consult: cholecystitis  HPI:  Shannon Andrade is a 62yo female who presented to [facility] earlier today with 3 days of worsening abdominal pain. States that the pain is in her upper abdomen and radiates into her back. It has progressively gotten worse. Associated with nausea and diarrhea. No emesis, fever, chills, dysuria. States that she has never had pain like this before. Eating makes the pain worse and nothing makes it better.  In the ED the patient had a CT scan that showed mild gallbladder distension, trace pericholecystic fluid, 3 mm dense gallstone at the neck, and common bile duct is 12 mm without choledocholithiasis. LFTs and lipase WNL. CBC 12.1. BP 159/96. Patient did go into SVT while in the ED, but states that she did not take her metoprolol today.  PMH significant for HTN, SVT, vertigo, migraines Abdominal surgical history: open appendectomy age 31, Gastric bypass in [place] Anticoagulants: none Smokes 2-3 cigarettes daily Disabled Recently moved to [facility] from [place] and lives with 2 of her sons  ROS: Review of Systems  Constitutional: Negative.   HENT: Negative.   Eyes: Negative.   Respiratory: Negative.   Cardiovascular: Negative.   Gastrointestinal: Positive for abdominal pain, diarrhea and nausea. Negative for constipation and vomiting.  Genitourinary: Negative.   Musculoskeletal: Positive for joint pain.  Skin: Negative.   Neurological: Negative.    All systems reviewed and otherwise negative except for as above  Family History  Problem Relation Age of Onset  . Diabetes Mother   . Heart disease Father     Past Medical History:  Diagnosis Date  . Anemia   . Angiomyolipoma of right kidney   . Arthritis    "hands, feet, back, neck" (11/04/2013)  . Chronic back pain    "neck down into lower back" (11/04/2013)   . DDD (degenerative disc disease)   . Depression   . GERD (gastroesophageal reflux disease)   . Hepatitis    "when I was pregnant; got the one that's not bad"   . Hypertension   . Kidney stones   . Migraine    "q few months" (11/04/2013)  . Paroxysmal SVT (supraventricular tachycardia) ([place])   . Tachycardia   . Type II diabetes mellitus ([place])    "took Metformin"; resolved S/P gastric bypass    Past Surgical History:  Procedure Laterality Date  . APPENDECTOMY  ~ 1972  . ESOPHAGOGASTRODUODENOSCOPY (EGD) WITH PROPOFOL N/A 11/28/2013   Procedure: ESOPHAGOGASTRODUODENOSCOPY (EGD) WITH PROPOFOL;  Surgeon: Inda Castle, MD;  Location: [facility];  Service: Endoscopy;  Laterality: N/A;  . GASTRIC BYPASS  2008  . KIDNEY STONE SURGERY  ~ 2004   "cut them out"  . LEFT HEART CATH AND CORONARY ANGIOGRAPHY N/A 06/27/2017   Procedure: LEFT HEART CATH AND CORONARY ANGIOGRAPHY;  Surgeon: Dixie Dials, MD;  Location: [facility];  Service: Cardiovascular;  Laterality: N/A;  . REDUCTION MAMMAPLASTY Bilateral ~ 2010    Social History:  reports that she has been smoking cigarettes.  She has a 21.50 pack-year smoking history. She has never used smokeless tobacco. She reports that she drinks about 4.8 oz of alcohol per week. She reports that she does not use drugs.  Allergies: No Known Allergies   (Not in a hospital admission)  Prior to Admission medications   Medication Sig Start Date End Date Taking? Authorizing Provider  acetaminophen (TYLENOL) 325 MG tablet Take 325 mg by mouth every 6 (six) hours as needed for mild pain.    Yes [provider]  DUEXIS 800-26.6 MG TABS Take 1 tablet by mouth daily. 04/29/17  Yes [provider]  enalapril (VASOTEC) 10 MG tablet Take 10 mg by mouth daily.   Yes [provider]  ferrous sulfate 325 (65 FE) MG tablet Take 325 mg by mouth daily with breakfast.   Yes [provider]  gabapentin (NEURONTIN) 300 MG capsule  Take 1 capsule (300 mg total) by mouth 3 (three) times daily. Patient taking differently: Take 300 mg by mouth 3 (three) times daily as needed (pain).  08/20/14  Yes Advani, Vernon Prey, MD  metoprolol tartrate (LOPRESSOR) 50 MG tablet Take 1 tablet (50 mg total) by mouth 2 (two) times daily. 06/27/17  Yes Velna Ochs, MD  Multiple Vitamins-Minerals (MULTIVITAMIN WITH MINERALS) tablet Take 1 tablet by mouth daily. 06/28/17 12/25/17 Yes Colbert Ewing, MD  omeprazole (PRILOSEC) 40 MG capsule Take 40 mg by mouth daily.   Yes [provider]  sertraline (ZOLOFT) 25 MG tablet Take 25 mg by mouth daily.  09/11/17  Yes [provider]  azelastine (OPTIVAR) 0.05 % ophthalmic solution Place 1 drop into both eyes 2 (two) times daily.  08/24/17   [provider]  Diclofenac Sodium (PENNSAID) 2 % SOLN Apply 1 application topically daily as needed (pain).    [provider]  fluticasone (FLONASE) 50 MCG/ACT nasal spray PLACE 2 SPRAYS INTO BOTH NOSTRILS DAILY. Patient taking differently: PLACE 2 SPRAYS INTO BOTH NOSTRILS DAILY AS NEEDED FOR ALLERGIES 04/07/15   Boykin Nearing, MD  meclizine (ANTIVERT) 25 MG tablet Take 1 tablet (25 mg total) by mouth 3 (three) times daily as needed for dizziness. Patient taking differently: Take 25 mg by mouth 2 (two) times daily as needed for dizziness.  08/20/14   Lorayne Marek, MD  oxybutynin (DITROPAN-XL) 10 MG 24 hr tablet Take 1 tablet (10 mg total) by mouth at bedtime. Patient taking differently: Take 10 mg by mouth daily as needed (bladder spasms).  12/08/14   Funches, Adriana Mccallum, MD  ranitidine (ZANTAC) 150 MG tablet Take 150 mg by mouth 2 (two) times daily as needed for heartburn.    [provider]  sucralfate (CARAFATE) 1 g tablet Take 1 g by mouth 2 (two) times daily as needed (indigestion).  08/14/17   [provider]    Blood pressure (!) 159/96, pulse 85, resp. rate (!) 21, height _0  (1.702 m), weight 235 lb  (106.6 kg), SpO2 96 %. Physical Exam: General: pleasant, WD/WN female who is laying in bed in NAD HEENT: head is normocephalic, atraumatic.  Sclera are noninjected.  Pupils equal and round.  Ears and nose without any masses or lesions.  Mouth is pink and moist. Dentition fair Heart: regular, rate, and rhythm.  No obvious murmurs, gallops, or rubs noted.  Palpable pedal pulses bilaterally Lungs: CTAB, no wheezes, rhonchi, or rales noted.  Respiratory effort nonlabored Abd: soft, ND, +BS, no masses, hernias, or organomegaly. +TTP epigastric region and RUQ with voluntary guarding MS: all 4 extremities are symmetrical with no cyanosis, clubbing, or edema. Skin: warm and dry with no masses, lesions, or rashes Psych: A&Ox3 with an appropriate affect. Neuro: cranial nerves grossly intact, extremity CSM intact bilaterally, normal speech  Results for orders placed or performed during the hospital encounter of 10/04/17 (from the past 48 hour(s))  Urinalysis, Routine w reflex microscopic- may I&O  cath if menses     Status: Abnormal   Collection Time: 10/04/17 12:46 PM  Result Value Ref Range   Color, Urine YELLOW YELLOW   APPearance CLEAR CLEAR   Specific Gravity, Urine 1.006 1.005 - 1.030   pH 7.0 5.0 - 8.0   Glucose, UA NEGATIVE NEGATIVE mg/dL   Hgb urine dipstick NEGATIVE NEGATIVE   Bilirubin Urine NEGATIVE NEGATIVE   Ketones, ur 20 (A) NEGATIVE mg/dL   Protein, ur NEGATIVE NEGATIVE mg/dL   Nitrite NEGATIVE NEGATIVE   Leukocytes, UA MODERATE (A) NEGATIVE   RBC / HPF 0-5 0 - 5 RBC/hpf   WBC, UA 0-5 0 - 5 WBC/hpf   Bacteria, UA RARE (A) NONE SEEN   Squamous Epithelial / LPF 0-5 0 - 5    Comment: Performed at [facility], [place] [address] [place], [place] [address]  Basic metabolic panel     Status: Abnormal   Collection Time: 10/04/17  1:30 PM  Result Value Ref Range   Sodium 143 135 - 145 mmol/L   Potassium 4.0 3.5 - 5.1 mmol/L   Chloride 104 98 - 111 mmol/L   CO2 26  22 - 32 mmol/L   Glucose, Bld 123 (H) 70 - 99 mg/dL   BUN 14 8 - 23 mg/dL   Creatinine, Ser 0.65 0.44 - 1.00 mg/dL   Calcium 9.3 8.9 - 10.3 mg/dL   GFR calc non Af Amer >60 >60 mL/min   GFR calc Af Amer >60 >60 mL/min    Comment: (NOTE) The eGFR has been calculated using the CKD EPI equation. This calculation has not been validated in all clinical situations. eGFR's persistently <60 mL/min signify possible Chronic Kidney Disease.    Anion gap 13 5 - 15    Comment: Performed at [facility], [place] [address], [place], [place] [address]  CBC     Status: Abnormal   Collection Time: 10/04/17  1:30 PM  Result Value Ref Range   WBC 12.1 (H) 4.0 - 10.5 K/uL   RBC 4.59 3.87 - 5.11 MIL/uL   Hemoglobin 14.6 12.0 - 15.0 g/dL   HCT 44.1 36.0 - 46.0 %   MCV 96.1 78.0 - 100.0 fL   MCH 31.8 26.0 - 34.0 pg   MCHC 33.1 30.0 - 36.0 g/dL   RDW 12.8 11.5 - 15.5 %   Platelets 254 150 - 400 K/uL    Comment: Performed at [facility], [place] [address] [place],  [address]  I-Stat Beta hCG blood, ED ([facility], [facility], [facility] only)     Status: None   Collection Time: 10/04/17  1:37 PM  Result Value Ref Range   I-stat hCG, quantitative <5.0 <5 mIU/mL   Comment 3            Comment:   GEST. AGE      CONC.  (mIU/mL)   <=1 WEEK        5 - 50     2 WEEKS       50 - 500     3 WEEKS       100 - 10,000     4 WEEKS     1,000 - 30,000        FEMALE AND NON-PREGNANT FEMALE:     LESS THAN 5 mIU/mL   I-Stat CG4 Lactic Acid, ED     Status: None   Collection Time: 10/04/17  1:38 PM  Result Value Ref Range   Lactic  Acid, Venous 1.00 0.5 - 1.9 mmol/L  Hepatic function panel     Status: Abnormal   Collection Time: 10/04/17  2:04 PM  Result Value Ref Range   Total Protein 7.4 6.5 - 8.1 g/dL   Albumin 3.9 3.5 - 5.0 g/dL   AST 26 15 - 41 U/L   ALT 19 0 - 44 U/L   Alkaline Phosphatase 89 38 - 126 U/L   Total Bilirubin 1.2 0.3 - 1.2 mg/dL   Bilirubin, Direct 0.3 (H) 0.0 - 0.2 mg/dL    Indirect Bilirubin 0.9 0.3 - 0.9 mg/dL    Comment: Performed at [facility], [place] [address] [place], [place] [address]  Lipase, blood     Status: None   Collection Time: 10/04/17  2:04 PM  Result Value Ref Range   Lipase 42 11 - 51 U/L    Comment: Performed at [facility], [place] [address], [place], [place] [address]  I-Stat Troponin, ED (not at [facility])     Status: None   Collection Time: 10/04/17  2:53 PM  Result Value Ref Range   Troponin i, poc 0.00 0.00 - 0.08 ng/mL   Comment 3            Comment: Due to the release kinetics of cTnI, a negative result within the first hours of the onset of symptoms does not rule out myocardial infarction with certainty. If myocardial infarction is still suspected, repeat the test at appropriate intervals.    Dg Chest 2 View  Result Date: 10/04/2017 CLINICAL DATA:  Lower chest pain EXAM: CHEST - 2 VIEW COMPARISON:  June 26, 2017 FINDINGS: The heart size and mediastinal contours are within normal limits. There is no focal infiltrate, pulmonary edema, or pleural effusion. The visualized skeletal structures are unremarkable. IMPRESSION: No active cardiopulmonary disease. Electronically Signed   By: Abelardo Diesel M.D.   On: 10/04/2017 16:12   Ct Abdomen Pelvis W Contrast  Result Date: 10/04/2017 CLINICAL DATA:  Severe abdominal and mid back pain, RIGHT lower quadrant pain. History of gastric bypass, appendectomy, kidney stones, RIGHT renal AML. EXAM: CT ABDOMEN AND PELVIS WITH CONTRAST TECHNIQUE: Multidetector CT imaging of the abdomen and pelvis was performed using the standard protocol following bolus administration of intravenous contrast. CONTRAST:  100 mL ISOVUE-300 IOPAMIDOL (ISOVUE-300) INJECTION 61% COMPARISON:  Abdominal ultrasound April 04, 2017 and MRI of the abdomen June 11, 2013. FINDINGS: LOWER CHEST: Lung bases are clear. Included heart size is normal. No pericardial effusion. HEPATOBILIARY: Mild  gallbladder distension, trace pericholecystic fluid. 3 mm dense gallstone at the neck. Common bile duct is 12 mm without choledocholithiasis. Mild intrahepatic biliary dilatation, liver otherwise unremarkable. PANCREAS: Normal. SPLEEN: Normal. ADRENALS/URINARY TRACT: Kidneys are orthotopic, demonstrating symmetric enhancement. No nephrolithiasis or hydronephrosis. 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 The unopacified ureters are normal in course and caliber. Delayed imaging through the kidneys demonstrates symmetric prompt contrast excretion within the proximal urinary collecting system. Urinary bladder is partially distended and unremarkable. Normal adrenal glands. STOMACH/BOWEL: Status post gastric bypass. Small and large bowel are normal in course and caliber without inflammatory changes. Mild amount of retained large bowel stool. VASCULAR/LYMPHATIC: Aortoiliac vessels are normal in course and caliber. Mild calcific atherosclerosis. No lymphadenopathy by CT size criteria. REPRODUCTIVE: Normal. OTHER: No intraperitoneal free fluid or free air. MUSCULOSKELETAL: Nonacute. Bilateral gluteal injection granulomas. Osteopenia. Scattered bone islands. Mild old L3 compression fracture with less than 25% height loss. IMPRESSION: 1. Acute cholecystitis versus recently  passed gallstone, ultrasound may be more definitive. 2. 3 cm RIGHT renal mass previously characterized as AML. Aortic Atherosclerosis (ICD10-I70.0). Electronically Signed   By: Elon Alas M.D.   On: 10/04/2017 15:39      Assessment/Plan HTN - home meds SVT - will ask Dr. Doylene Canard Vertigo - home meds GERD - protonix [place]  Acute cholecystitis - prior h/o open appendectomy and Gastric bypass in [place] ~2009 - CT scan shows mild gallbladder distension, trace pericholecystic fluid, 3 mm dense gallstone at the neck, and common bile duct is 12 mm without choledocholithiasis.  - LFTs and lipase WNL. CBC 12.1 - u/s  pending  ID - zosyn 8/7>> VTE - SCDs FEN - IVF, CLD today and NPO after MN Foley - none  Plan - Admit to tele floor. Ok for liquids today, NPO after midnight. Repeat CBC/CMP in AM. Will await cardiology consult for surgical clearance for laparoscopic cholecystectomy. Continue IVF and IV zosyn.  Wellington Hampshire, [facility] 10/04/2017, 4:39 PM Pager: [phone] Consults: [phone] Mon 7:00 am -11:30 AM Tues-Fri 7:00 am-4:30 pm Sat-Sun 7:00 am-11:30 am

## 2017-10-04 NOTE — ED Notes (Signed)
Report given to Sophia RN for [place] 1430.

## 2017-10-05 ENCOUNTER — Inpatient Hospital Stay (HOSPITAL_COMMUNITY): Payer: Medicare (Managed Care)

## 2017-10-05 ENCOUNTER — Other Ambulatory Visit (HOSPITAL_COMMUNITY): Payer: Self-pay | Admitting: Radiology

## 2017-10-05 ENCOUNTER — Ambulatory Visit (HOSPITAL_COMMUNITY): Payer: Medicare (Managed Care)

## 2017-10-05 LAB — COMPREHENSIVE METABOLIC PANEL
ALBUMIN: 3.3 g/dL — AB (ref 3.5–5.0)
ALT: 78 U/L — ABNORMAL HIGH (ref 0–44)
ANION GAP: 6 (ref 5–15)
AST: 98 U/L — ABNORMAL HIGH (ref 15–41)
Alkaline Phosphatase: 126 U/L (ref 38–126)
BILIRUBIN TOTAL: 1.1 mg/dL (ref 0.3–1.2)
BUN: 7 mg/dL — ABNORMAL LOW (ref 8–23)
CO2: 26 mmol/L (ref 22–32)
Calcium: 8.9 mg/dL (ref 8.9–10.3)
Chloride: 106 mmol/L (ref 98–111)
Creatinine, Ser: 0.47 mg/dL (ref 0.44–1.00)
GFR calc Af Amer: >60 mL/min (ref >60)
GFR calc non Af Amer: >60 mL/min (ref >60)
GLUCOSE: 108 mg/dL — AB (ref 70–99)
POTASSIUM: 4.2 mmol/L (ref 3.5–5.1)
Sodium: 138 mmol/L (ref 135–145)
TOTAL PROTEIN: 6.4 g/dL — AB (ref 6.5–8.1)

## 2017-10-05 LAB — CBC
HEMATOCRIT: 39.8 % (ref 36.0–46.0)
HEMOGLOBIN: 13.2 g/dL (ref 12.0–15.0)
MCH: 31.7 pg (ref 26.0–34.0)
MCHC: 33.2 g/dL (ref 30.0–36.0)
MCV: 95.7 fL (ref 78.0–100.0)
Platelets: 246 10*3/uL (ref 150–400)
RBC: 4.16 MIL/uL (ref 3.87–5.11)
RDW: 12.9 % (ref 11.5–15.5)
WBC: 9.8 10*3/uL (ref 4.0–10.5)

## 2017-10-05 LAB — PROTIME-INR
INR: 0.93
Prothrombin Time: 12.4 seconds (ref 11.4–15.2)

## 2017-10-05 LAB — URINE CULTURE: CULTURE: NO GROWTH

## 2017-10-05 MED ORDER — GADOBENATE DIMEGLUMINE 529 MG/ML IV SOLN
20.0000 mL | Freq: Once | INTRAVENOUS | Status: AC | PRN
  Administered 2017-10-05: 19 mL via INTRAVENOUS

## 2017-10-05 NOTE — Progress Notes (Signed)
MD (CCs) called to inquire about MRCP, MD updated and arrangement planned and set to have procedure completed at [facility]. SRP, RN

## 2017-10-05 NOTE — Progress Notes (Signed)
Pt transported to MRI at [facility]  For MRCP. Unsuccessful, arrangement need to be made for procedure to be completed at [facility]. SRP, RN

## 2017-10-05 NOTE — Progress Notes (Signed)
Ref: Patient, No Pcp Per   Subjective:  Abdominal pain in RUQ present but not as severe. Afebrile. No leukocytosis. MRCP pending.   Objective:  Vital Signs in the last 24 hours: Temp:  [97.8 F (36.6 C)-98.5 F (36.9 C)] 97.8 F (36.6 C) (08/08 0415) Pulse Rate:  [58-91] 58 (08/08 1356) Cardiac Rhythm: Normal sinus rhythm (08/08 0700) Resp:  [16-21] 18 (08/08 1356) BP: (132-159)/(85-100) 136/85 (08/08 1356) SpO2:  [94 %-98 %] 98 % (08/08 1356) Weight:  [105.2 kg-106.6 kg] 105.2 kg (08/08 0500)  Physical Exam: BP Readings from Last 1 Encounters:  10/05/17 136/85     Wt Readings from Last 1 Encounters:  10/05/17 105.2 kg    Weight change:  Body mass index is 36.32 kg/m. HEENT: [place]/AT, Eyes-Light Brown, Conjunctiva-Pink, Sclera-Non-icteric Neck: No JVD, No bruit, Trachea midline. Lungs:  Clear, Bilateral. Cardiac:  Regular rhythm, normal S1 and S2, no S3. II/VI systolic murmur. Abdomen:  Soft, RUQ mildly tender. Epigastric and umbilical tenderness resolved. BS present. Extremities:  No edema present. No cyanosis. No clubbing. CNS: AxOx3, Cranial nerves grossly intact, moves all 4 extremities.  Skin: Warm and dry.   Intake/Output from previous day: 08/07 0701 - 08/08 0700 In: 2071.6 [P.O.:480; I.V.:541.6; IV Piggyback:1050] Out: 2 [Urine:2]    Lab Results: BMET    Component Value Date/Time   NA 138 10/05/2017 0433   NA 143 10/04/2017 1330   NA 141 06/28/2017 0341   K 4.2 10/05/2017 0433   K 4.0 10/04/2017 1330   K 4.2 06/28/2017 0341   CL 106 10/05/2017 0433   CL 104 10/04/2017 1330   CL 106 06/28/2017 0341   CO2 26 10/05/2017 0433   CO2 26 10/04/2017 1330   CO2 26 06/28/2017 0341   GLUCOSE 108 (H) 10/05/2017 0433   GLUCOSE 123 (H) 10/04/2017 1330   GLUCOSE 98 06/28/2017 0341   BUN 7 (L) 10/05/2017 0433   BUN 14 10/04/2017 1330   BUN 9 06/28/2017 0341   CREATININE 0.47 10/05/2017 0433   CREATININE 0.65 10/04/2017 1330   CREATININE 0.59 06/28/2017 0341   CREATININE 0.65 05/28/2013 1532   CALCIUM 8.9 10/05/2017 0433   CALCIUM 9.3 10/04/2017 1330   CALCIUM 9.0 06/28/2017 0341   GFRNONAA >60 10/05/2017 0433   GFRNONAA >60 10/04/2017 1330   GFRNONAA >60 06/28/2017 0341   GFRNONAA >89 05/28/2013 1532   GFRAA >60 10/05/2017 0433   GFRAA >60 10/04/2017 1330   GFRAA >60 06/28/2017 0341   GFRAA >89 05/28/2013 1532   CBC    Component Value Date/Time   WBC 9.8 10/05/2017 0433   RBC 4.16 10/05/2017 0433   HGB 13.2 10/05/2017 0433   HCT 39.8 10/05/2017 0433   PLT 246 10/05/2017 0433   MCV 95.7 10/05/2017 0433   MCH 31.7 10/05/2017 0433   MCHC 33.2 10/05/2017 0433   RDW 12.9 10/05/2017 0433   LYMPHSABS 4.5 (H) 06/26/2017 0845   MONOABS 0.9 06/26/2017 0845   EOSABS 0.0 06/26/2017 0845   BASOSABS 0.0 06/26/2017 0845   HEPATIC Function Panel Recent Labs    06/26/17 0845 10/04/17 1404 10/05/17 0433  PROT 6.2* 7.4 6.4*   HEMOGLOBIN A1C No components found for: HGA1C,  MPG CARDIAC ENZYMES Lab Results  Component Value Date   TROPONINI 0.03 (HH) 06/27/2017   TROPONINI 0.03 (HH) 06/26/2017   TROPONINI 0.04 (HH) 06/26/2017   BNP No results for input(s): PROBNP in the last 8760 hours. TSH Recent Labs    06/26/17 0936  TSH 0.604  CHOLESTEROL No results for input(s): CHOL in the last 8760 hours.  Scheduled Meds: . enalapril  10 mg Oral Daily  . fluticasone  2 spray Each Nare Daily  . heparin  5,000 Units Subcutaneous Q8H  . ketotifen  1 drop Both Eyes BID  . metoprolol tartrate  50 mg Oral BID  . pantoprazole  40 mg Oral Daily   Continuous Infusions: . sodium chloride Stopped (10/04/17 1852)  . dextrose 5 % and 0.45% NaCl 75 mL/hr at 10/05/17 1028  . piperacillin-tazobactam (ZOSYN)  IV 3.375 g (10/05/17 1035)   PRN Meds:.acetaminophen **OR** acetaminophen, diphenhydrAMINE **OR** diphenhydrAMINE, gabapentin, HYDROmorphone (DILAUDID) injection, meclizine, methocarbamol, metoprolol tartrate, ondansetron **OR** ondansetron  (ZOFRAN) IV, oxybutynin, oxyCODONE, simethicone  Assessment/Plan: Acute cholecystitis with cholelithiasis, resolving SVT, paroxysmal Obesity Type 2 DM Hypertension  Conitinue medical treatment. Follow with surgery.   LOS: 1 day    Dixie Dials  MD  10/05/2017, 2:25 PM

## 2017-10-05 NOTE — Progress Notes (Signed)
Pt transported to [facility] MRI for MRCP procedure. Interpreter arranged and pt understands procedure and plan of care has been reviewed with pt and son. Pt transported by [facility] to [facility]. Monitor suspended and Iv capped off. SRP, RN

## 2017-10-05 NOTE — Progress Notes (Addendum)
Subjective: Interviewed and examined with translator (remote with ipad) Feels much better with almost no pain. AST 98, ALT 78, Bili pending MRCP today  Objective: Vital signs in last 24 hours: Temp:  [97.8 F (36.6 C)-98.5 F (36.9 C)] 97.8 F (36.6 C) (08/08 0415) Pulse Rate:  [70-91] 70 (08/08 0415) Resp:  [16-21] 16 (08/08 0415) BP: (132-175)/(86-105) 132/92 (08/08 0415) SpO2:  [94 %-98 %] 94 % (08/08 0415) Weight:  [105.2 kg-106.6 kg] 105.2 kg (08/08 0500)    Intake/Output from previous day: 08/07 0701 - 08/08 0700 In: 2071.6 [P.O.:480; I.V.:541.6; IV Piggyback:1050] Out: 2 [Urine:2] Intake/Output this shift: Total I/O In: 823.3 [P.O.:240; I.V.:533.3; IV Piggyback:50] Out: 2 [Urine:2]  General appearance: alert no distress Resp: clear to auscultation bilaterally GI: mild tenderness RUQ no guarding, no mass  Lab Results:  Recent Labs    10/04/17 1330 10/05/17 0433  WBC 12.1* 9.8  HGB 14.6 13.2  HCT 44.1 39.8  PLT 254 246   BMET Recent Labs    10/04/17 1330 10/05/17 0433  NA 143 138  K 4.0 4.2  CL 104 106  CO2 26 26  GLUCOSE 123* 108*  BUN 14 7*  CREATININE 0.65 0.47  CALCIUM 9.3 8.9   PT/INR Recent Labs    10/05/17 0433  LABPROT 12.4  INR 0.93   ABG No results for input(s): PHART, HCO3 in the last 72 hours.  Invalid input(s): PCO2, PO2  Studies/Results: Dg Chest 2 View  Result Date: 10/04/2017 CLINICAL DATA:  Lower chest pain EXAM: CHEST - 2 VIEW COMPARISON:  June 26, 2017 FINDINGS: The heart size and mediastinal contours are within normal limits. There is no focal infiltrate, pulmonary edema, or pleural effusion. The visualized skeletal structures are unremarkable. IMPRESSION: No active cardiopulmonary disease. Electronically Signed   By: Abelardo Diesel M.D.   On: 10/04/2017 16:12   Ct Abdomen Pelvis W Contrast  Result Date: 10/04/2017 CLINICAL DATA:  Severe abdominal and mid back pain, RIGHT lower quadrant pain. History of gastric  bypass, appendectomy, kidney stones, RIGHT renal AML. EXAM: CT ABDOMEN AND PELVIS WITH CONTRAST TECHNIQUE: Multidetector CT imaging of the abdomen and pelvis was performed using the standard protocol following bolus administration of intravenous contrast. CONTRAST:  100 mL ISOVUE-300 IOPAMIDOL (ISOVUE-300) INJECTION 61% COMPARISON:  Abdominal ultrasound April 04, 2017 and MRI of the abdomen June 11, 2013. FINDINGS: LOWER CHEST: Lung bases are clear. Included heart size is normal. No pericardial effusion. HEPATOBILIARY: Mild gallbladder distension, trace pericholecystic fluid. 3 mm dense gallstone at the neck. Common bile duct is 12 mm without choledocholithiasis. Mild intrahepatic biliary dilatation, liver otherwise unremarkable. PANCREAS: Normal. SPLEEN: Normal. ADRENALS/URINARY TRACT: Kidneys are orthotopic, demonstrating symmetric enhancement. No nephrolithiasis or hydronephrosis. 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 The unopacified ureters are normal in course and caliber. Delayed imaging through the kidneys demonstrates symmetric prompt contrast excretion within the proximal urinary collecting system. Urinary bladder is partially distended and unremarkable. Normal adrenal glands. STOMACH/BOWEL: Status post gastric bypass. Small and large bowel are normal in course and caliber without inflammatory changes. Mild amount of retained large bowel stool. VASCULAR/LYMPHATIC: Aortoiliac vessels are normal in course and caliber. Mild calcific atherosclerosis. No lymphadenopathy by CT size criteria. REPRODUCTIVE: Normal. OTHER: No intraperitoneal free fluid or free air. MUSCULOSKELETAL: Nonacute. Bilateral gluteal injection granulomas. Osteopenia. Scattered bone islands. Mild old L3 compression fracture with less than 25% height loss. IMPRESSION: 1. Acute cholecystitis versus recently passed gallstone, ultrasound may be more definitive. 2. 3 cm RIGHT renal  mass previously characterized as  AML. Aortic Atherosclerosis (ICD10-I70.0). Electronically Signed   By: Elon Alas M.D.   On: 10/04/2017 15:39   US Abdomen Limited Ruq  Result Date: 10/04/2017 CLINICAL DATA:  Severe abdominal back pain. History of gastric bypass, appendectomy, kidney stones, RIGHT renal AML. Assess gallbladder. EXAM: ULTRASOUND ABDOMEN LIMITED RIGHT UPPER QUADRANT COMPARISON:  CT abdomen and pelvis October 04, 2017. FINDINGS: Gallbladder: Gallbladder distention with wall thickening and trace pericholecystic fluid. Echogenic cholelithiasis measuring to 5 mm. Gallbladder wall thickening at 5 mm. Sonographic Murphy sign elicited. Additional punctate echogenic mural based foci with ring down artifact consistent with adenomyomatosis. Common bile duct: Diameter: 13 mm Liver: No focal lesion identified. Within normal limits in parenchymal echogenicity. Mild intrahepatic biliary dilatation. Portal vein is patent on color Doppler imaging with normal direction of blood flow towards the liver. IMPRESSION: 1. Cholelithiasis and sonographic findings of acute cholecystitis. 2. Intra and extrahepatic biliary dilatation. No sonographic findings of choledocholithiasis. Electronically Signed   By: Elon Alas M.D.   On: 10/04/2017 17:50    Anti-infectives: Anti-infectives (From admission, onward)   Start     Dose/Rate Route Frequency Ordered Stop   10/05/17 0100  piperacillin-tazobactam (ZOSYN) IVPB 3.375 g     3.375 g 12.5 mL/hr over 240 Minutes Intravenous Every 8 hours 10/04/17 1706     10/04/17 1600  piperacillin-tazobactam (ZOSYN) IVPB 3.375 g     3.375 g 100 mL/hr over 30 Minutes Intravenous  Once 10/04/17 1558 10/04/17 1725      Assessment/Plan:  Acute cholecystitis -bowel rest and antibiotics for now. - prior h/o open appendectomy and Gastric bypass in [place] ~2009 - CT scan shows mild gallbladder distension, trace pericholecystic fluid, 3 mm dense gallstone at the neck, and common bile duct is 12 mm without  choledocholithiasis.  -[place] consistent with acute cholecystitis. CBD 13 mm., but no CBD stones seen. -Elevated transaminases may simply be secondary to gallbladder inflammation, but cannot be sure -MRCP this morning - ERCP would be problematic with hx. Gastric bypass. -reassess for cholecystectomy after MRCP  SVT-appreciate Dr. Doylene Canard HTN Vertigo GERD migraines   LOS: 1 day    Shannon Andrade 10/05/2017

## 2017-10-06 ENCOUNTER — Encounter (HOSPITAL_COMMUNITY)
Admission: EM | Disposition: A | Discharge status: Home / Self Care | Payer: Self-pay | Source: Home / Self Care | Attending: Cardiovascular Disease

## 2017-10-06 ENCOUNTER — Inpatient Hospital Stay (HOSPITAL_COMMUNITY): Payer: Medicare (Managed Care) | Admitting: Certified Registered Nurse Anesthetist

## 2017-10-06 ENCOUNTER — Encounter (HOSPITAL_COMMUNITY): Payer: Self-pay | Admitting: Emergency Medicine

## 2017-10-06 ENCOUNTER — Inpatient Hospital Stay (HOSPITAL_COMMUNITY): Payer: Medicare (Managed Care)

## 2017-10-06 HISTORY — PX: CHOLECYSTECTOMY: SHX55

## 2017-10-06 LAB — CBC
HCT: 40.6 % (ref 36.0–46.0)
HEMOGLOBIN: 13.5 g/dL (ref 12.0–15.0)
MCH: 32.2 pg (ref 26.0–34.0)
MCHC: 33.3 g/dL (ref 30.0–36.0)
MCV: 96.9 fL (ref 78.0–100.0)
Platelets: 223 10*3/uL (ref 150–400)
RBC: 4.19 MIL/uL (ref 3.87–5.11)
RDW: 12.8 % (ref 11.5–15.5)
WBC: 7 10*3/uL (ref 4.0–10.5)

## 2017-10-06 LAB — COMPREHENSIVE METABOLIC PANEL
ALBUMIN: 3.5 g/dL (ref 3.5–5.0)
ALK PHOS: 106 U/L (ref 38–126)
ALT: 49 U/L — ABNORMAL HIGH (ref 0–44)
ANION GAP: 9 (ref 5–15)
AST: 41 U/L (ref 15–41)
BUN: 7 mg/dL — ABNORMAL LOW (ref 8–23)
CALCIUM: 9.2 mg/dL (ref 8.9–10.3)
CO2: 25 mmol/L (ref 22–32)
Chloride: 108 mmol/L (ref 98–111)
Creatinine, Ser: 0.56 mg/dL (ref 0.44–1.00)
GFR calc Af Amer: >60 mL/min (ref >60)
GFR calc non Af Amer: >60 mL/min (ref >60)
GLUCOSE: 98 mg/dL (ref 70–99)
Potassium: 4 mmol/L (ref 3.5–5.1)
SODIUM: 142 mmol/L (ref 135–145)
Total Bilirubin: 0.8 mg/dL (ref 0.3–1.2)
Total Protein: 6.5 g/dL (ref 6.5–8.1)

## 2017-10-06 LAB — SURGICAL PCR SCREEN
MRSA, PCR: NEGATIVE
Staphylococcus aureus: NEGATIVE

## 2017-10-06 LAB — GLUCOSE, CAPILLARY: GLUCOSE-CAPILLARY: 151 mg/dL — AB (ref 70–99)

## 2017-10-06 LAB — LIPASE, BLOOD: Lipase: 39 U/L (ref 11–51)

## 2017-10-06 SURGERY — LAPAROSCOPIC CHOLECYSTECTOMY WITH INTRAOPERATIVE CHOLANGIOGRAM
Anesthesia: General | Site: Abdomen

## 2017-10-06 MED ORDER — MEPERIDINE HCL 50 MG/ML IJ SOLN: 6.2500 mg | INTRAMUSCULAR | Status: DC | PRN

## 2017-10-06 MED ORDER — ROCURONIUM BROMIDE 10 MG/ML (PF) SYRINGE
PREFILLED_SYRINGE | INTRAVENOUS | Status: AC
  Filled 2017-10-06: qty 10

## 2017-10-06 MED ORDER — HYDROMORPHONE HCL 1 MG/ML IJ SOLN: 0.2500 mg | INTRAMUSCULAR | Status: DC | PRN

## 2017-10-06 MED ORDER — FENTANYL CITRATE (PF) 250 MCG/5ML IJ SOLN
INTRAMUSCULAR | Status: AC
  Filled 2017-10-06: qty 5

## 2017-10-06 MED ORDER — DEXAMETHASONE SODIUM PHOSPHATE 10 MG/ML IJ SOLN
INTRAMUSCULAR | Status: AC
  Filled 2017-10-06: qty 1

## 2017-10-06 MED ORDER — FENTANYL CITRATE (PF) 100 MCG/2ML IJ SOLN
INTRAMUSCULAR | Status: DC | PRN
  Administered 2017-10-06: 100 ug via INTRAVENOUS
  Administered 2017-10-06 (×3): 50 ug via INTRAVENOUS

## 2017-10-06 MED ORDER — DEXAMETHASONE SODIUM PHOSPHATE 10 MG/ML IJ SOLN
INTRAMUSCULAR | Status: DC | PRN
  Administered 2017-10-06: 5 mg via INTRAVENOUS

## 2017-10-06 MED ORDER — PROPOFOL 10 MG/ML IV BOLUS
INTRAVENOUS | Status: DC | PRN
  Administered 2017-10-06: 150 mg via INTRAVENOUS

## 2017-10-06 MED ORDER — CHLORHEXIDINE GLUCONATE CLOTH 2 % EX PADS: 6.0000 | MEDICATED_PAD | Freq: Once | CUTANEOUS | Status: DC

## 2017-10-06 MED ORDER — ROCURONIUM BROMIDE 50 MG/5ML IV SOSY
PREFILLED_SYRINGE | INTRAVENOUS | Status: DC | PRN
  Administered 2017-10-06: 40 mg via INTRAVENOUS

## 2017-10-06 MED ORDER — MIDAZOLAM HCL 2 MG/2ML IJ SOLN
INTRAMUSCULAR | Status: AC
  Filled 2017-10-06: qty 2

## 2017-10-06 MED ORDER — CHLORHEXIDINE GLUCONATE CLOTH 2 % EX PADS
6.0000 | MEDICATED_PAD | Freq: Once | CUTANEOUS | Status: AC
  Administered 2017-10-06: 6 via TOPICAL

## 2017-10-06 MED ORDER — ACETAMINOPHEN 500 MG PO TABS
1000.0000 mg | ORAL_TABLET | Freq: Once | ORAL | Status: AC
  Administered 2017-10-06: 1000 mg via ORAL
  Filled 2017-10-06: qty 2

## 2017-10-06 MED ORDER — SUGAMMADEX SODIUM 200 MG/2ML IV SOLN
INTRAVENOUS | Status: DC | PRN
  Administered 2017-10-06: 200 mg via INTRAVENOUS

## 2017-10-06 MED ORDER — ACETAMINOPHEN 10 MG/ML IV SOLN: 1000.0000 mg | Freq: Once | INTRAVENOUS | Status: DC | PRN

## 2017-10-06 MED ORDER — PROPOFOL 10 MG/ML IV BOLUS
INTRAVENOUS | Status: AC
  Filled 2017-10-06: qty 20

## 2017-10-06 MED ORDER — ONDANSETRON HCL 4 MG/2ML IJ SOLN
INTRAMUSCULAR | Status: DC | PRN
  Administered 2017-10-06: 4 mg via INTRAVENOUS

## 2017-10-06 MED ORDER — PIPERACILLIN-TAZOBACTAM 3.375 G IVPB
3.3750 g | Freq: Three times a day (TID) | INTRAVENOUS | Status: AC
  Administered 2017-10-06: 3.375 g via INTRAVENOUS
  Filled 2017-10-06: qty 50

## 2017-10-06 MED ORDER — HEPARIN SODIUM (PORCINE) 5000 UNIT/ML IJ SOLN: 5000.0000 [IU] | Freq: Three times a day (TID) | INTRAMUSCULAR | Status: DC

## 2017-10-06 MED ORDER — SUCCINYLCHOLINE CHLORIDE 200 MG/10ML IV SOSY
PREFILLED_SYRINGE | INTRAVENOUS | Status: AC
  Filled 2017-10-06: qty 10

## 2017-10-06 MED ORDER — IOPAMIDOL (ISOVUE-300) INJECTION 61%
INTRAVENOUS | Status: DC | PRN
  Administered 2017-10-06: 3.5 mL via INTRAVENOUS

## 2017-10-06 MED ORDER — SUCCINYLCHOLINE CHLORIDE 200 MG/10ML IV SOSY
PREFILLED_SYRINGE | INTRAVENOUS | Status: DC | PRN
  Administered 2017-10-06: 100 mg via INTRAVENOUS

## 2017-10-06 MED ORDER — ACETAMINOPHEN 500 MG PO TABS
1000.0000 mg | ORAL_TABLET | Freq: Four times a day (QID) | ORAL | Status: DC
  Administered 2017-10-06 – 2017-10-07 (×4): 1000 mg via ORAL
  Filled 2017-10-06 (×4): qty 2

## 2017-10-06 MED ORDER — GLYCOPYRROLATE PF 0.2 MG/ML IJ SOSY
PREFILLED_SYRINGE | INTRAMUSCULAR | Status: DC | PRN
  Administered 2017-10-06: .2 mg via INTRAVENOUS

## 2017-10-06 MED ORDER — HYDROCODONE-ACETAMINOPHEN 7.5-325 MG PO TABS: 1.0000 | ORAL_TABLET | Freq: Once | ORAL | Status: DC | PRN

## 2017-10-06 MED ORDER — ONDANSETRON HCL 4 MG/2ML IJ SOLN
INTRAMUSCULAR | Status: AC
  Filled 2017-10-06: qty 2

## 2017-10-06 MED ORDER — GABAPENTIN 300 MG PO CAPS
300.0000 mg | ORAL_CAPSULE | Freq: Once | ORAL | Status: AC
  Administered 2017-10-06: 300 mg via ORAL

## 2017-10-06 MED ORDER — PROMETHAZINE HCL 25 MG/ML IJ SOLN: 6.2500 mg | INTRAMUSCULAR | Status: DC | PRN

## 2017-10-06 MED ORDER — LACTATED RINGERS IV SOLN
INTRAVENOUS | Status: DC
  Administered 2017-10-06: 12:00:00 via INTRAVENOUS

## 2017-10-06 MED ORDER — BUPIVACAINE-EPINEPHRINE (PF) 0.5% -1:200000 IJ SOLN
INTRAMUSCULAR | Status: AC
  Filled 2017-10-06: qty 30

## 2017-10-06 MED ORDER — LIDOCAINE 2% (20 MG/ML) 5 ML SYRINGE
INTRAMUSCULAR | Status: AC
  Filled 2017-10-06: qty 5

## 2017-10-06 MED ORDER — LIDOCAINE 2% (20 MG/ML) 5 ML SYRINGE
INTRAMUSCULAR | Status: DC | PRN
  Administered 2017-10-06: 100 mg via INTRAVENOUS

## 2017-10-06 MED ORDER — CLONIDINE HCL 0.2 MG PO TABS
0.2000 mg | ORAL_TABLET | Freq: Once | ORAL | Status: AC
  Administered 2017-10-06: 0.2 mg via ORAL
  Filled 2017-10-06: qty 1

## 2017-10-06 MED ORDER — IOPAMIDOL (ISOVUE-300) INJECTION 61%
INTRAVENOUS | Status: AC
  Filled 2017-10-06: qty 50

## 2017-10-06 MED ORDER — MIDAZOLAM HCL 5 MG/5ML IJ SOLN
INTRAMUSCULAR | Status: DC | PRN
  Administered 2017-10-06: 2 mg via INTRAVENOUS

## 2017-10-06 MED ORDER — BUPIVACAINE-EPINEPHRINE 0.5% -1:200000 IJ SOLN
INTRAMUSCULAR | Status: DC | PRN
  Administered 2017-10-06: 26 mL

## 2017-10-06 MED ORDER — EPHEDRINE SULFATE-NACL 50-0.9 MG/10ML-% IV SOSY
PREFILLED_SYRINGE | INTRAVENOUS | Status: DC | PRN
  Administered 2017-10-06: 10 mg via INTRAVENOUS

## 2017-10-06 SURGICAL SUPPLY — 31 items
APPLIER CLIP ROT 10 11.4 M/L (STAPLE) ×3
CABLE HIGH FREQUENCY MONO STRZ (ELECTRODE) ×3 IMPLANT
CLIP APPLIE ROT 10 11.4 M/L (STAPLE) ×1 IMPLANT
COVER MAYO STAND STRL (DRAPES) ×3 IMPLANT
COVER SURGICAL LIGHT HANDLE (MISCELLANEOUS) ×3 IMPLANT
DECANTER SPIKE VIAL GLASS SM (MISCELLANEOUS) ×3 IMPLANT
DERMABOND ADVANCED (GAUZE/BANDAGES/DRESSINGS) ×2
DERMABOND ADVANCED .7 DNX12 (GAUZE/BANDAGES/DRESSINGS) ×1 IMPLANT
DRAPE C-ARM 42X120 X-RAY (DRAPES) ×3 IMPLANT
ELECT REM PT RETURN 15FT ADLT (MISCELLANEOUS) ×3 IMPLANT
GLOVE EUDERMIC 7 POWDERFREE (GLOVE) ×3 IMPLANT
GOWN STRL REUS W/TWL XL LVL3 (GOWN DISPOSABLE) ×12 IMPLANT
HEMOSTAT SNOW SURGICEL 2X4 (HEMOSTASIS) IMPLANT
KIT BASIN OR (CUSTOM PROCEDURE TRAY) ×3 IMPLANT
POSITIONER SURGICAL ARM (MISCELLANEOUS) IMPLANT
POUCH RETRIEVAL ECOSAC 10 (ENDOMECHANICALS) IMPLANT
POUCH RETRIEVAL ECOSAC 10MM (ENDOMECHANICALS)
POUCH SPECIMEN RETRIEVAL 10MM (ENDOMECHANICALS) ×2 IMPLANT
SCISSORS LAP 5X35 DISP (ENDOMECHANICALS) ×3 IMPLANT
SET CHOLANGIOGRAPH MIX (MISCELLANEOUS) ×3 IMPLANT
SET IRRIG TUBING LAPAROSCOPIC (IRRIGATION / IRRIGATOR) ×3 IMPLANT
SLEEVE XCEL OPT CAN 5 100 (ENDOMECHANICALS) ×3 IMPLANT
SUT MNCRL AB 4-0 PS2 18 (SUTURE) ×3 IMPLANT
TAPE CLOTH 4X10 WHT NS (GAUZE/BANDAGES/DRESSINGS) IMPLANT
TOWEL OR 17X26 10 PK STRL BLUE (TOWEL DISPOSABLE) ×3 IMPLANT
TOWEL OR NON WOVEN STRL DISP B (DISPOSABLE) ×3 IMPLANT
TRAY LAPAROSCOPIC (CUSTOM PROCEDURE TRAY) ×3 IMPLANT
TROCAR BLADELESS OPT 5 100 (ENDOMECHANICALS) ×3 IMPLANT
TROCAR XCEL BLUNT TIP 100MML (ENDOMECHANICALS) ×3 IMPLANT
TROCAR XCEL NON-BLD 11X100MML (ENDOMECHANICALS) ×3 IMPLANT
TUBING INSUF HEATED (TUBING) ×3 IMPLANT

## 2017-10-06 NOTE — Discharge Instructions (Signed)

## 2017-10-06 NOTE — Progress Notes (Signed)
Subjective: Patient interviewed and examined this morning with Hispanic translator on the iPad service.  Alert and cooperative States she has no pain but still a little tender MRCP shows some dilatation of CBD but intrahepatic ducts not dilated.  Pancreatic duct not dilated.  No evidence of choledocholithiasis.  No evidence of pancreatic mass.  Gallbladder wall thickened consistent with acute cholecystitis.  LFTs back to normal this morning.  WBC 7000.  Hemoglobin 13.5  Objective: Vital signs in last 24 hours: Temp:  [97.9 F (36.6 C)-98.2 F (36.8 C)] 97.9 F (36.6 C) (08/09 0403) Pulse Rate:  [58-67] 59 (08/09 0403) Resp:  [18] 18 (08/09 0403) BP: (132-155)/(80-91) 155/91 (08/09 0403) SpO2:  [97 %-98 %] 98 % (08/09 0403) Weight:  [103.9 kg] 103.9 kg (08/09 0403) Last BM Date: 10/03/17  Intake/Output from previous day: 08/08 0701 - 08/09 0700 In: 1088.1 [P.O.:120; I.V.:819; IV Piggyback:149.2] Out: 0  Intake/Output this shift: No intake/output data recorded.  General appearance: Alert.  Cooperative.  Pleasant.  No distress Resp: clear to auscultation bilaterally GI: Soft.  Nondistended.  Subjectively tender right upper quadrant.  No mass.  Scars from prior gastric bypass and appendectomy noted  Lab Results:  Recent Labs    10/05/17 0433 10/06/17 0625  WBC 9.8 7.0  HGB 13.2 13.5  HCT 39.8 40.6  PLT 246 223   BMET Recent Labs    10/05/17 0433 10/06/17 0625  NA 138 142  K 4.2 4.0  CL 106 108  CO2 26 25  GLUCOSE 108* 98  BUN 7* 7*  CREATININE 0.47 0.56  CALCIUM 8.9 9.2   PT/INR Recent Labs    10/05/17 0433  LABPROT 12.4  INR 0.93   ABG No results for input(s): PHART, HCO3 in the last 72 hours.  Invalid input(s): PCO2, PO2  Studies/Results: Dg Chest 2 View  Result Date: 10/04/2017 CLINICAL DATA:  Lower chest pain EXAM: CHEST - 2 VIEW COMPARISON:  June 26, 2017 FINDINGS: The heart size and mediastinal contours are within normal limits. There is  no focal infiltrate, pulmonary edema, or pleural effusion. The visualized skeletal structures are unremarkable. IMPRESSION: No active cardiopulmonary disease. Electronically Signed   By: Abelardo Diesel M.D.   On: 10/04/2017 16:12   Ct Abdomen Pelvis W Contrast  Result Date: 10/04/2017 CLINICAL DATA:  Severe abdominal and mid back pain, RIGHT lower quadrant pain. History of gastric bypass, appendectomy, kidney stones, RIGHT renal AML. EXAM: CT ABDOMEN AND PELVIS WITH CONTRAST TECHNIQUE: Multidetector CT imaging of the abdomen and pelvis was performed using the standard protocol following bolus administration of intravenous contrast. CONTRAST:  100 mL ISOVUE-300 IOPAMIDOL (ISOVUE-300) INJECTION 61% COMPARISON:  Abdominal ultrasound April 04, 2017 and MRI of the abdomen June 11, 2013. FINDINGS: LOWER CHEST: Lung bases are clear. Included heart size is normal. No pericardial effusion. HEPATOBILIARY: Mild gallbladder distension, trace pericholecystic fluid. 3 mm dense gallstone at the neck. Common bile duct is 12 mm without choledocholithiasis. Mild intrahepatic biliary dilatation, liver otherwise unremarkable. PANCREAS: Normal. SPLEEN: Normal. ADRENALS/URINARY TRACT: Kidneys are orthotopic, demonstrating symmetric enhancement. No nephrolithiasis or hydronephrosis. 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 The unopacified ureters are normal in course and caliber. Delayed imaging through the kidneys demonstrates symmetric prompt contrast excretion within the proximal urinary collecting system. Urinary bladder is partially distended and unremarkable. Normal adrenal glands. STOMACH/BOWEL: Status post gastric bypass. Small and large bowel are normal in course and caliber without inflammatory changes. Mild amount of retained large bowel stool. VASCULAR/LYMPHATIC: Aortoiliac  vessels are normal in course and caliber. Mild calcific atherosclerosis. No lymphadenopathy by CT size criteria.  REPRODUCTIVE: Normal. OTHER: No intraperitoneal free fluid or free air. MUSCULOSKELETAL: Nonacute. Bilateral gluteal injection granulomas. Osteopenia. Scattered bone islands. Mild old L3 compression fracture with less than 25% height loss. IMPRESSION: 1. Acute cholecystitis versus recently passed gallstone, ultrasound may be more definitive. 2. 3 cm RIGHT renal mass previously characterized as AML. Aortic Atherosclerosis (ICD10-I70.0). Electronically Signed   By: Elon Alas M.D.   On: 10/04/2017 15:39   Mr 3d Recon At Scanner  Result Date: 10/05/2017 CLINICAL DATA:  62 year old female with history of severe abdominal and mid back pain. Right lower quadrant abdominal pain. History of gastric bypass and appendectomy. EXAM: MRI ABDOMEN WITHOUT AND WITH CONTRAST (INCLUDING MRCP) TECHNIQUE: Multiplanar multisequence MR imaging of the abdomen was performed both before and after the administration of intravenous contrast. Heavily T2-weighted images of the biliary and pancreatic ducts were obtained, and three-dimensional MRCP images were rendered by post processing. CONTRAST:  36mL MULTIHANCE GADOBENATE DIMEGLUMINE 529 MG/ML IV SOLN COMPARISON:  Abdominal MRI 06/11/2013. CT the abdomen and pelvis 10/04/2017. Abdominal ultrasound 10/04/2017. FINDINGS: Lower chest: Unremarkable. Hepatobiliary: Mild diffuse loss of signal intensity throughout the hepatic parenchyma on out of phase dual echo images, indicative of mild hepatic steatosis. No suspicious cystic or solid hepatic lesions. MRCP images demonstrate no intrahepatic biliary ductal dilatation. Common bile duct is dilated measuring up to 13 mm in the porta hepatis. No filling defect in the common bile duct to suggest choledocholithiasis. There are tiny filling defects lying dependently in the gallbladder, compatible with gallstones. Gallbladder does not appear distended. Mild diffuse gallbladder wall edema. No frank pericholecystic fluid. Pancreas: No pancreatic  mass. No pancreatic ductal dilatation noted on MRCP images. No pancreatic or peripancreatic fluid or inflammatory changes. Spleen:  Unremarkable. Adrenals/Urinary Tract: Exophytic lesion measuring 3 cm in the lateral aspect of the right kidney at junction of upper and interpolar region which demonstrates internal areas with etching artifact on out of phase dual echo images, compatible with internal macroscopic lipid. This lesion is generally T1 isointense, and heterogeneous in T2 signal intensity with both hypointensity and slight hyperintensity, restricts diffusion, and demonstrates avid enhancement on post gadolinium imaging, compatible with a lipid poor angiomyolipoma. In the medial aspect of the upper pole the left kidney there is again a 1.3 cm T1 hyperintense, T2 hypointense, nonenhancing lesion (axial image 40 of series 18), compatible with a proteinaceous/hemorrhagic cyst. No other suspicious renal lesions. No hydroureteronephrosis in the visualized portions of the abdomen. Bilateral adrenal glands are normal in appearance. Stomach/Bowel: Visualized portions are unremarkable. Vascular/Lymphatic: No aneurysm identified in the visualized abdominal vasculature. No lymphadenopathy noted in the abdomen. Other: No significant volume of ascites noted in the visualized portions of the peritoneal cavity. Musculoskeletal: No aggressive appearing osseous lesions are noted in the visualized portions of the skeleton. IMPRESSION: 1. Cholelithiasis with diffuse gallbladder wall edema. However, the gallbladder does not appear distended and there is no frank pericholecystic fluid on today's examination. Findings are considered equivocal for an acute cholecystitis and further clinical evaluation is recommended. 2. Moderate common bile duct dilatation. This is unusual, as there is no associated intrahepatic biliary ductal dilatation. Additionally, there is no choledocholithiasis. No pancreatic ductal dilatation. No findings to  suggest obstructing pancreatic head mass. The possibility of a distal common bile duct stricture could be considered, however, this is not readily apparent by MRCP. 3. 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 4.  1.3 cm proteinaceous/hemorrhagic cyst in the upper pole the left kidney. These results will be called to the ordering clinician or representative by the Radiologist Assistant, and communication documented in the PACS or zVision Dashboard. Electronically Signed   By: Vinnie Langton M.D.   On: 10/05/2017 21:15   Mr Abdomen Mrcp Moise Boring Contast  Result Date: 10/05/2017 CLINICAL DATA:  62 year old female with history of severe abdominal and mid back pain. Right lower quadrant abdominal pain. History of gastric bypass and appendectomy. EXAM: MRI ABDOMEN WITHOUT AND WITH CONTRAST (INCLUDING MRCP) TECHNIQUE: Multiplanar multisequence MR imaging of the abdomen was performed both before and after the administration of intravenous contrast. Heavily T2-weighted images of the biliary and pancreatic ducts were obtained, and three-dimensional MRCP images were rendered by post processing. CONTRAST:  2mL MULTIHANCE GADOBENATE DIMEGLUMINE 529 MG/ML IV SOLN COMPARISON:  Abdominal MRI 06/11/2013. CT the abdomen and pelvis 10/04/2017. Abdominal ultrasound 10/04/2017. FINDINGS: Lower chest: Unremarkable. Hepatobiliary: Mild diffuse loss of signal intensity throughout the hepatic parenchyma on out of phase dual echo images, indicative of mild hepatic steatosis. No suspicious cystic or solid hepatic lesions. MRCP images demonstrate no intrahepatic biliary ductal dilatation. Common bile duct is dilated measuring up to 13 mm in the porta hepatis. No filling defect in the common bile duct to suggest choledocholithiasis. There are tiny filling defects lying dependently in the gallbladder, compatible with gallstones. Gallbladder does not appear distended. Mild diffuse gallbladder wall edema. No frank pericholecystic  fluid. Pancreas: No pancreatic mass. No pancreatic ductal dilatation noted on MRCP images. No pancreatic or peripancreatic fluid or inflammatory changes. Spleen:  Unremarkable. Adrenals/Urinary Tract: Exophytic lesion measuring 3 cm in the lateral aspect of the right kidney at junction of upper and interpolar region which demonstrates internal areas with etching artifact on out of phase dual echo images, compatible with internal macroscopic lipid. This lesion is generally T1 isointense, and heterogeneous in T2 signal intensity with both hypointensity and slight hyperintensity, restricts diffusion, and demonstrates avid enhancement on post gadolinium imaging, compatible with a lipid poor angiomyolipoma. In the medial aspect of the upper pole the left kidney there is again a 1.3 cm T1 hyperintense, T2 hypointense, nonenhancing lesion (axial image 40 of series 18), compatible with a proteinaceous/hemorrhagic cyst. No other suspicious renal lesions. No hydroureteronephrosis in the visualized portions of the abdomen. Bilateral adrenal glands are normal in appearance. Stomach/Bowel: Visualized portions are unremarkable. Vascular/Lymphatic: No aneurysm identified in the visualized abdominal vasculature. No lymphadenopathy noted in the abdomen. Other: No significant volume of ascites noted in the visualized portions of the peritoneal cavity. Musculoskeletal: No aggressive appearing osseous lesions are noted in the visualized portions of the skeleton. IMPRESSION: 1. Cholelithiasis with diffuse gallbladder wall edema. However, the gallbladder does not appear distended and there is no frank pericholecystic fluid on today's examination. Findings are considered equivocal for an acute cholecystitis and further clinical evaluation is recommended. 2. Moderate common bile duct dilatation. This is unusual, as there is no associated intrahepatic biliary ductal dilatation. Additionally, there is no choledocholithiasis. No pancreatic  ductal dilatation. No findings to suggest obstructing pancreatic head mass. The possibility of a distal common bile duct stricture could be considered, however, this is not readily apparent by MRCP. 3. 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 4. 1.3 cm proteinaceous/hemorrhagic cyst in the upper pole the left kidney. These results will be called to the ordering clinician or representative by the Radiologist Assistant, and communication documented in the PACS or zVision Dashboard. Electronically Signed  By: Vinnie Langton M.D.   On: 10/05/2017 21:15   US Abdomen Limited Ruq  Result Date: 10/04/2017 CLINICAL DATA:  Severe abdominal back pain. History of gastric bypass, appendectomy, kidney stones, RIGHT renal AML. Assess gallbladder. EXAM: ULTRASOUND ABDOMEN LIMITED RIGHT UPPER QUADRANT COMPARISON:  CT abdomen and pelvis October 04, 2017. FINDINGS: Gallbladder: Gallbladder distention with wall thickening and trace pericholecystic fluid. Echogenic cholelithiasis measuring to 5 mm. Gallbladder wall thickening at 5 mm. Sonographic Murphy sign elicited. Additional punctate echogenic mural based foci with ring down artifact consistent with adenomyomatosis. Common bile duct: Diameter: 13 mm Liver: No focal lesion identified. Within normal limits in parenchymal echogenicity. Mild intrahepatic biliary dilatation. Portal vein is patent on color Doppler imaging with normal direction of blood flow towards the liver. IMPRESSION: 1. Cholelithiasis and sonographic findings of acute cholecystitis. 2. Intra and extrahepatic biliary dilatation. No sonographic findings of choledocholithiasis. Electronically Signed   By: Elon Alas M.D.   On: 10/04/2017 17:50    Anti-infectives: Anti-infectives (From admission, onward)   Start     Dose/Rate Route Frequency Ordered Stop   10/05/17 0100  piperacillin-tazobactam (ZOSYN) IVPB 3.375 g     3.375 g 12.5 mL/hr over 240 Minutes Intravenous Every 8 hours 10/04/17  1706     10/04/17 1600  piperacillin-tazobactam (ZOSYN) IVPB 3.375 g     3.375 g 100 mL/hr over 30 Minutes Intravenous  Once 10/04/17 1558 10/04/17 1725      Assessment/Plan: s/p Procedure(s): LAPAROSCOPIC CHOLECYSTECTOMY WITH INTRAOPERATIVE CHOLANGIOGRAM  Acute cholecystitis - prior h/o open appendectomy and Gastric bypass in [place] ~2009 -CT scan shows mild gallbladder distension, trace pericholecystic fluid,3 mm dense gallstone at the neck, and common bile duct is 12 mm without choledocholithiasis.  -[place] consistent with acute cholecystitis. CBD 13 mm., but no CBD stones seen. -Elevated transaminases may simply be secondary to gallbladder inflammation, but cannot be sure.  These have now normalized MRCP discussed above.  No evidence of choledocholithiasis or pancreatic mass.  -PLAN:... proceed with laparoscopic cholecystectomy, possible cholangiogram today I discussed the indications, details, techniques, and numerous risk of the surgery with her.  She is aware of the risk of bleeding, infection, conversion to open laparotomy, bile leak, diarrhea, port site hernia, injury to adjacent organs requiring major reconstructive surgery.  She understands that she may have common duct stones which may require complicated management including transhepatic removal or open surgery.  She understands all of these issues.  All of her questions were answered.  She agrees with this plan.  SVT-appreciate Dr. Doylene Canard HTN Vertigo GERD migraines   LOS: 2 days    Adin Hector 10/06/2017

## 2017-10-06 NOTE — H&P (View-Only) (Signed)
Subjective: Patient interviewed and examined this morning with Hispanic translator on the iPad service.  Alert and cooperative States she has no pain but still a little tender MRCP shows some dilatation of CBD but intrahepatic ducts not dilated.  Pancreatic duct not dilated.  No evidence of choledocholithiasis.  No evidence of pancreatic mass.  Gallbladder wall thickened consistent with acute cholecystitis.  LFTs back to normal this morning.  WBC 7000.  Hemoglobin 13.5  Objective: Vital signs in last 24 hours: Temp:  [97.9 F (36.6 C)-98.2 F (36.8 C)] 97.9 F (36.6 C) (08/09 0403) Pulse Rate:  [58-67] 59 (08/09 0403) Resp:  [18] 18 (08/09 0403) BP: (132-155)/(80-91) 155/91 (08/09 0403) SpO2:  [97 %-98 %] 98 % (08/09 0403) Weight:  [103.9 kg] 103.9 kg (08/09 0403) Last BM Date: 10/03/17  Intake/Output from previous day: 08/08 0701 - 08/09 0700 In: 1088.1 [P.O.:120; I.V.:819; IV Piggyback:149.2] Out: 0  Intake/Output this shift: No intake/output data recorded.  General appearance: Alert.  Cooperative.  Pleasant.  No distress Resp: clear to auscultation bilaterally GI: Soft.  Nondistended.  Subjectively tender right upper quadrant.  No mass.  Scars from prior gastric bypass and appendectomy noted  Lab Results:  Recent Labs    10/05/17 0433 10/06/17 0625  WBC 9.8 7.0  HGB 13.2 13.5  HCT 39.8 40.6  PLT 246 223   BMET Recent Labs    10/05/17 0433 10/06/17 0625  NA 138 142  K 4.2 4.0  CL 106 108  CO2 26 25  GLUCOSE 108* 98  BUN 7* 7*  CREATININE 0.47 0.56  CALCIUM 8.9 9.2   PT/INR Recent Labs    10/05/17 0433  LABPROT 12.4  INR 0.93   ABG No results for input(s): PHART, HCO3 in the last 72 hours.  Invalid input(s): PCO2, PO2  Studies/Results: Dg Chest 2 View  Result Date: 10/04/2017 CLINICAL DATA:  Lower chest pain EXAM: CHEST - 2 VIEW COMPARISON:  June 26, 2017 FINDINGS: The heart size and mediastinal contours are within normal limits. There is  no focal infiltrate, pulmonary edema, or pleural effusion. The visualized skeletal structures are unremarkable. IMPRESSION: No active cardiopulmonary disease. Electronically Signed   By: Abelardo Diesel M.D.   On: 10/04/2017 16:12   Ct Abdomen Pelvis W Contrast  Result Date: 10/04/2017 CLINICAL DATA:  Severe abdominal and mid back pain, RIGHT lower quadrant pain. History of gastric bypass, appendectomy, kidney stones, RIGHT renal AML. EXAM: CT ABDOMEN AND PELVIS WITH CONTRAST TECHNIQUE: Multidetector CT imaging of the abdomen and pelvis was performed using the standard protocol following bolus administration of intravenous contrast. CONTRAST:  100 mL ISOVUE-300 IOPAMIDOL (ISOVUE-300) INJECTION 61% COMPARISON:  Abdominal ultrasound April 04, 2017 and MRI of the abdomen June 11, 2013. FINDINGS: LOWER CHEST: Lung bases are clear. Included heart size is normal. No pericardial effusion. HEPATOBILIARY: Mild gallbladder distension, trace pericholecystic fluid. 3 mm dense gallstone at the neck. Common bile duct is 12 mm without choledocholithiasis. Mild intrahepatic biliary dilatation, liver otherwise unremarkable. PANCREAS: Normal. SPLEEN: Normal. ADRENALS/URINARY TRACT: Kidneys are orthotopic, demonstrating symmetric enhancement. No nephrolithiasis or hydronephrosis. 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 The unopacified ureters are normal in course and caliber. Delayed imaging through the kidneys demonstrates symmetric prompt contrast excretion within the proximal urinary collecting system. Urinary bladder is partially distended and unremarkable. Normal adrenal glands. STOMACH/BOWEL: Status post gastric bypass. Small and large bowel are normal in course and caliber without inflammatory changes. Mild amount of retained large bowel stool. VASCULAR/LYMPHATIC: Aortoiliac  vessels are normal in course and caliber. Mild calcific atherosclerosis. No lymphadenopathy by CT size criteria.  REPRODUCTIVE: Normal. OTHER: No intraperitoneal free fluid or free air. MUSCULOSKELETAL: Nonacute. Bilateral gluteal injection granulomas. Osteopenia. Scattered bone islands. Mild old L3 compression fracture with less than 25% height loss. IMPRESSION: 1. Acute cholecystitis versus recently passed gallstone, ultrasound may be more definitive. 2. 3 cm RIGHT renal mass previously characterized as AML. Aortic Atherosclerosis (ICD10-I70.0). Electronically Signed   By: Elon Alas M.D.   On: 10/04/2017 15:39   Mr 3d Recon At Scanner  Result Date: 10/05/2017 CLINICAL DATA:  62 year old female with history of severe abdominal and mid back pain. Right lower quadrant abdominal pain. History of gastric bypass and appendectomy. EXAM: MRI ABDOMEN WITHOUT AND WITH CONTRAST (INCLUDING MRCP) TECHNIQUE: Multiplanar multisequence MR imaging of the abdomen was performed both before and after the administration of intravenous contrast. Heavily T2-weighted images of the biliary and pancreatic ducts were obtained, and three-dimensional MRCP images were rendered by post processing. CONTRAST:  60mL MULTIHANCE GADOBENATE DIMEGLUMINE 529 MG/ML IV SOLN COMPARISON:  Abdominal MRI 06/11/2013. CT the abdomen and pelvis 10/04/2017. Abdominal ultrasound 10/04/2017. FINDINGS: Lower chest: Unremarkable. Hepatobiliary: Mild diffuse loss of signal intensity throughout the hepatic parenchyma on out of phase dual echo images, indicative of mild hepatic steatosis. No suspicious cystic or solid hepatic lesions. MRCP images demonstrate no intrahepatic biliary ductal dilatation. Common bile duct is dilated measuring up to 13 mm in the porta hepatis. No filling defect in the common bile duct to suggest choledocholithiasis. There are tiny filling defects lying dependently in the gallbladder, compatible with gallstones. Gallbladder does not appear distended. Mild diffuse gallbladder wall edema. No frank pericholecystic fluid. Pancreas: No pancreatic  mass. No pancreatic ductal dilatation noted on MRCP images. No pancreatic or peripancreatic fluid or inflammatory changes. Spleen:  Unremarkable. Adrenals/Urinary Tract: Exophytic lesion measuring 3 cm in the lateral aspect of the right kidney at junction of upper and interpolar region which demonstrates internal areas with etching artifact on out of phase dual echo images, compatible with internal macroscopic lipid. This lesion is generally T1 isointense, and heterogeneous in T2 signal intensity with both hypointensity and slight hyperintensity, restricts diffusion, and demonstrates avid enhancement on post gadolinium imaging, compatible with a lipid poor angiomyolipoma. In the medial aspect of the upper pole the left kidney there is again a 1.3 cm T1 hyperintense, T2 hypointense, nonenhancing lesion (axial image 40 of series 18), compatible with a proteinaceous/hemorrhagic cyst. No other suspicious renal lesions. No hydroureteronephrosis in the visualized portions of the abdomen. Bilateral adrenal glands are normal in appearance. Stomach/Bowel: Visualized portions are unremarkable. Vascular/Lymphatic: No aneurysm identified in the visualized abdominal vasculature. No lymphadenopathy noted in the abdomen. Other: No significant volume of ascites noted in the visualized portions of the peritoneal cavity. Musculoskeletal: No aggressive appearing osseous lesions are noted in the visualized portions of the skeleton. IMPRESSION: 1. Cholelithiasis with diffuse gallbladder wall edema. However, the gallbladder does not appear distended and there is no frank pericholecystic fluid on today's examination. Findings are considered equivocal for an acute cholecystitis and further clinical evaluation is recommended. 2. Moderate common bile duct dilatation. This is unusual, as there is no associated intrahepatic biliary ductal dilatation. Additionally, there is no choledocholithiasis. No pancreatic ductal dilatation. No findings to  suggest obstructing pancreatic head mass. The possibility of a distal common bile duct stricture could be considered, however, this is not readily apparent by MRCP. 3. 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 4.  1.3 cm proteinaceous/hemorrhagic cyst in the upper pole the left kidney. These results will be called to the ordering clinician or representative by the Radiologist Assistant, and communication documented in the PACS or zVision Dashboard. Electronically Signed   By: Vinnie Langton M.D.   On: 10/05/2017 21:15   Mr Abdomen Mrcp Moise Boring Contast  Result Date: 10/05/2017 CLINICAL DATA:  62 year old female with history of severe abdominal and mid back pain. Right lower quadrant abdominal pain. History of gastric bypass and appendectomy. EXAM: MRI ABDOMEN WITHOUT AND WITH CONTRAST (INCLUDING MRCP) TECHNIQUE: Multiplanar multisequence MR imaging of the abdomen was performed both before and after the administration of intravenous contrast. Heavily T2-weighted images of the biliary and pancreatic ducts were obtained, and three-dimensional MRCP images were rendered by post processing. CONTRAST:  15mL MULTIHANCE GADOBENATE DIMEGLUMINE 529 MG/ML IV SOLN COMPARISON:  Abdominal MRI 06/11/2013. CT the abdomen and pelvis 10/04/2017. Abdominal ultrasound 10/04/2017. FINDINGS: Lower chest: Unremarkable. Hepatobiliary: Mild diffuse loss of signal intensity throughout the hepatic parenchyma on out of phase dual echo images, indicative of mild hepatic steatosis. No suspicious cystic or solid hepatic lesions. MRCP images demonstrate no intrahepatic biliary ductal dilatation. Common bile duct is dilated measuring up to 13 mm in the porta hepatis. No filling defect in the common bile duct to suggest choledocholithiasis. There are tiny filling defects lying dependently in the gallbladder, compatible with gallstones. Gallbladder does not appear distended. Mild diffuse gallbladder wall edema. No frank pericholecystic  fluid. Pancreas: No pancreatic mass. No pancreatic ductal dilatation noted on MRCP images. No pancreatic or peripancreatic fluid or inflammatory changes. Spleen:  Unremarkable. Adrenals/Urinary Tract: Exophytic lesion measuring 3 cm in the lateral aspect of the right kidney at junction of upper and interpolar region which demonstrates internal areas with etching artifact on out of phase dual echo images, compatible with internal macroscopic lipid. This lesion is generally T1 isointense, and heterogeneous in T2 signal intensity with both hypointensity and slight hyperintensity, restricts diffusion, and demonstrates avid enhancement on post gadolinium imaging, compatible with a lipid poor angiomyolipoma. In the medial aspect of the upper pole the left kidney there is again a 1.3 cm T1 hyperintense, T2 hypointense, nonenhancing lesion (axial image 40 of series 18), compatible with a proteinaceous/hemorrhagic cyst. No other suspicious renal lesions. No hydroureteronephrosis in the visualized portions of the abdomen. Bilateral adrenal glands are normal in appearance. Stomach/Bowel: Visualized portions are unremarkable. Vascular/Lymphatic: No aneurysm identified in the visualized abdominal vasculature. No lymphadenopathy noted in the abdomen. Other: No significant volume of ascites noted in the visualized portions of the peritoneal cavity. Musculoskeletal: No aggressive appearing osseous lesions are noted in the visualized portions of the skeleton. IMPRESSION: 1. Cholelithiasis with diffuse gallbladder wall edema. However, the gallbladder does not appear distended and there is no frank pericholecystic fluid on today's examination. Findings are considered equivocal for an acute cholecystitis and further clinical evaluation is recommended. 2. Moderate common bile duct dilatation. This is unusual, as there is no associated intrahepatic biliary ductal dilatation. Additionally, there is no choledocholithiasis. No pancreatic  ductal dilatation. No findings to suggest obstructing pancreatic head mass. The possibility of a distal common bile duct stricture could be considered, however, this is not readily apparent by MRCP. 3. 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 4. 1.3 cm proteinaceous/hemorrhagic cyst in the upper pole the left kidney. These results will be called to the ordering clinician or representative by the Radiologist Assistant, and communication documented in the PACS or zVision Dashboard. Electronically Signed  By: Vinnie Langton M.D.   On: 10/05/2017 21:15   US Abdomen Limited Ruq  Result Date: 10/04/2017 CLINICAL DATA:  Severe abdominal back pain. History of gastric bypass, appendectomy, kidney stones, RIGHT renal AML. Assess gallbladder. EXAM: ULTRASOUND ABDOMEN LIMITED RIGHT UPPER QUADRANT COMPARISON:  CT abdomen and pelvis October 04, 2017. FINDINGS: Gallbladder: Gallbladder distention with wall thickening and trace pericholecystic fluid. Echogenic cholelithiasis measuring to 5 mm. Gallbladder wall thickening at 5 mm. Sonographic Murphy sign elicited. Additional punctate echogenic mural based foci with ring down artifact consistent with adenomyomatosis. Common bile duct: Diameter: 13 mm Liver: No focal lesion identified. Within normal limits in parenchymal echogenicity. Mild intrahepatic biliary dilatation. Portal vein is patent on color Doppler imaging with normal direction of blood flow towards the liver. IMPRESSION: 1. Cholelithiasis and sonographic findings of acute cholecystitis. 2. Intra and extrahepatic biliary dilatation. No sonographic findings of choledocholithiasis. Electronically Signed   By: Elon Alas M.D.   On: 10/04/2017 17:50    Anti-infectives: Anti-infectives (From admission, onward)   Start     Dose/Rate Route Frequency Ordered Stop   10/05/17 0100  piperacillin-tazobactam (ZOSYN) IVPB 3.375 g     3.375 g 12.5 mL/hr over 240 Minutes Intravenous Every 8 hours 10/04/17  1706     10/04/17 1600  piperacillin-tazobactam (ZOSYN) IVPB 3.375 g     3.375 g 100 mL/hr over 30 Minutes Intravenous  Once 10/04/17 1558 10/04/17 1725      Assessment/Plan: s/p Procedure(s): LAPAROSCOPIC CHOLECYSTECTOMY WITH INTRAOPERATIVE CHOLANGIOGRAM  Acute cholecystitis - prior h/o open appendectomy and Gastric bypass in [place] ~2009 -CT scan shows mild gallbladder distension, trace pericholecystic fluid,3 mm dense gallstone at the neck, and common bile duct is 12 mm without choledocholithiasis.  -[place] consistent with acute cholecystitis. CBD 13 mm., but no CBD stones seen. -Elevated transaminases may simply be secondary to gallbladder inflammation, but cannot be sure.  These have now normalized MRCP discussed above.  No evidence of choledocholithiasis or pancreatic mass.  -PLAN:... proceed with laparoscopic cholecystectomy, possible cholangiogram today I discussed the indications, details, techniques, and numerous risk of the surgery with her.  She is aware of the risk of bleeding, infection, conversion to open laparotomy, bile leak, diarrhea, port site hernia, injury to adjacent organs requiring major reconstructive surgery.  She understands that she may have common duct stones which may require complicated management including transhepatic removal or open surgery.  She understands all of these issues.  All of her questions were answered.  She agrees with this plan.  SVT-appreciate Dr. Doylene Canard HTN Vertigo GERD migraines   LOS: 2 days    Adin Hector 10/06/2017

## 2017-10-06 NOTE — Interval H&P Note (Signed)
History and Physical Interval Note:  10/06/2017 10:32 AM  Shannon Andrade  has presented today for surgery, with the diagnosis of Cholecystitis  The various methods of treatment have been discussed with the patient and family. After consideration of risks, benefits and other options for treatment, the patient has consented to  Procedure(s): LAPAROSCOPIC CHOLECYSTECTOMY WITH INTRAOPERATIVE CHOLANGIOGRAM (N/A) as a surgical intervention .  The patient's history has been reviewed, patient examined, no change in status, stable for surgery.  I have reviewed the patient's chart and labs.  Questions were answered to the patient's satisfaction.     Adin Hector

## 2017-10-06 NOTE — Op Note (Signed)
Patient Name:           Shannon Andrade   Date of Surgery:        10/06/2017  Pre op Diagnosis:      Acute cholecystitis with cholelithiasis                                       History Roux-en-Y gastric bypass  Post op Diagnosis:    Same  Procedure:                 Laparoscopic cholecystectomy with intraoperative cholangiogram  Surgeon:                     Edsel Petrin. Dalbert Batman, M.D., FACS  Assistant:                      Saverio Danker, PA   Indication for Assistant: Acute inflammation.  Complex exposure following forgot surgery.  Operative Indications:   This is a 62 year old Hispanic female with a history of Roux-en-Y gastric bypass many years ago.  Also history open appendectomy.  She presented with right upper quadrant pain.  CT and ultrasound were consistent with acute cholecystitis and dilatation of the common bile duct.  She had mild elevation of transaminases but they normalized preop.  Because of her altered gastric anatomy we performed MRCP and that showed dilated common bile duct of uncertain etiology.  Normal intrahepatic ducts.  No filling defect and no pancreatic mass.  She has improved on antibiotics and is brought to the operating room for cholecystectomy  Operative Findings:       The gallbladder was acutely inflamed and edematous.  The anatomy of the cystic duct cystic artery and common bile duct were conventional.  The cholangiogram showed a dilated extrahepatic biliary tree but normal intrahepatic biliary tree, there is no filling defect or irregularity of the distal common duct and the contrast flowed into the duodenum without difficulty.  The liver looked healthy.  Procedure in Detail:          Following the induction of general endotracheal anesthesia the patient's abdomen was prepped and draped in a sterile fashion.  Intravenous antibiotics have been given.  Surgical timeout was performed.  0.5% Marcaine with epinephrine was used as local infiltration anesthetic.  A  vertical incision was made at the lower rim of the umbilicus.  The fascia was incised in the midline and the abdominal cavity entered under direct vision.  11 mm Hassan trocar was inserted and secured with the pursestring suture of 0 Vicryl Vicryl.  Pneumoperitoneum was created.  Video camera was inserted with findings as described above.  A trocar was placed in the subxiphoid region and two 5 mm trochars placed in the right upper quadrant.      The gallbladder was retracted.  I dissected the peritoneum off of the neck of the gallbladder.  I dissected out the cystic duct and cystic artery and created a large window behind these 2 structures.  Cholangiogram catheter was inserted into the cystic duct and a cholangiogram obtained using the C arm.  The cholangiogram was normal as described above.  The cholangiogram catheter was removed.  The cystic duct was secured with multiple metal clips and divided.  The cystic artery was secured with multiple metal clips and divided.  The gallbladder was dissected from its bed placed in a specimen  bag and removed.  There was acute and chronic inflammation.  We cauterized the gallbladder bed where the liver bled a little bit but hemostasis was ultimately excellent.  At the completion of the case the irrigation fluid was clear.  There was no bleeding or bile leak.     4 quadrant inspection revealed no other abnormalities.  The pneumoperitoneum was released.  The fascia at the umbilicus was closed with 0 Vicryl sutures.  The skin incision were closed with subcuticular 4-0 Monocryl and Dermabond.  Patient tolerated the procedure well and was taken to PACU in stable condition.  EBL 20 cc or less.  Counts correct.  Complications none.     Edsel Petrin. Dalbert Batman, M.D., FACS General and Minimally Invasive Surgery Breast and Colorectal Surgery  10/06/2017 2:54 PM

## 2017-10-06 NOTE — Progress Notes (Signed)
Ref: Patient, No Pcp Per   Subjective:  Awake and afebrile. RUQ discomfort continues. Awaiting laparoscopic cholecystectomy.  Objective:  Vital Signs in the last 24 hours: Temp:  [97.9 F (36.6 C)-98.2 F (36.8 C)] 97.9 F (36.6 C) (08/09 0403) Pulse Rate:  [58-67] 59 (08/09 0403) Cardiac Rhythm: Normal sinus rhythm (08/09 0700) Resp:  [18] 18 (08/09 0403) BP: (132-155)/(80-91) 155/91 (08/09 0403) SpO2:  [97 %-98 %] 98 % (08/09 0403) Weight:  [103.9 kg] 103.9 kg (08/09 0403)  Physical Exam: BP Readings from Last 1 Encounters:  10/06/17 (!) 155/91     Wt Readings from Last 1 Encounters:  10/06/17 103.9 kg    Weight change: -2.676 kg Body mass index is 35.88 kg/m. HEENT: [place]/AT, Eyes-Light Brown, Conjunctiva-Pink, Sclera-Non-icteric Neck: No JVD, No bruit, Trachea midline. Lungs:  Clear, Bilateral. Cardiac:  Regular rhythm, normal S1 and S2, no S3. II/VI systolic murmur. Abdomen:  Soft, RUQ-tender. BS present. Extremities:  No edema present. No cyanosis. No clubbing. CNS: AxOx3, Cranial nerves grossly intact, moves all 4 extremities.  Skin: Warm and dry.   Intake/Output from previous day: 08/08 0701 - 08/09 0700 In: 1088.1 [P.O.:120; I.V.:819; IV Piggyback:149.2] Out: 0     Lab Results: BMET    Component Value Date/Time   NA 142 10/06/2017 0625   NA 138 10/05/2017 0433   NA 143 10/04/2017 1330   K 4.0 10/06/2017 0625   K 4.2 10/05/2017 0433   K 4.0 10/04/2017 1330   CL 108 10/06/2017 0625   CL 106 10/05/2017 0433   CL 104 10/04/2017 1330   CO2 25 10/06/2017 0625   CO2 26 10/05/2017 0433   CO2 26 10/04/2017 1330   GLUCOSE 98 10/06/2017 0625   GLUCOSE 108 (H) 10/05/2017 0433   GLUCOSE 123 (H) 10/04/2017 1330   BUN 7 (L) 10/06/2017 0625   BUN 7 (L) 10/05/2017 0433   BUN 14 10/04/2017 1330   CREATININE 0.56 10/06/2017 0625   CREATININE 0.47 10/05/2017 0433   CREATININE 0.65 10/04/2017 1330   CREATININE 0.65 05/28/2013 1532   CALCIUM 9.2 10/06/2017 0625    CALCIUM 8.9 10/05/2017 0433   CALCIUM 9.3 10/04/2017 1330   GFRNONAA >60 10/06/2017 0625   GFRNONAA >60 10/05/2017 0433   GFRNONAA >60 10/04/2017 1330   GFRNONAA >89 05/28/2013 1532   GFRAA >60 10/06/2017 0625   GFRAA >60 10/05/2017 0433   GFRAA >60 10/04/2017 1330   GFRAA >89 05/28/2013 1532   CBC    Component Value Date/Time   WBC 7.0 10/06/2017 0625   RBC 4.19 10/06/2017 0625   HGB 13.5 10/06/2017 0625   HCT 40.6 10/06/2017 0625   PLT 223 10/06/2017 0625   MCV 96.9 10/06/2017 0625   MCH 32.2 10/06/2017 0625   MCHC 33.3 10/06/2017 0625   RDW 12.8 10/06/2017 0625   LYMPHSABS 4.5 (H) 06/26/2017 0845   MONOABS 0.9 06/26/2017 0845   EOSABS 0.0 06/26/2017 0845   BASOSABS 0.0 06/26/2017 0845   HEPATIC Function Panel Recent Labs    10/04/17 1404 10/05/17 0433 10/06/17 0625  PROT 7.4 6.4* 6.5   HEMOGLOBIN A1C No components found for: HGA1C,  MPG CARDIAC ENZYMES Lab Results  Component Value Date   TROPONINI 0.03 (HH) 06/27/2017   TROPONINI 0.03 (HH) 06/26/2017   TROPONINI 0.04 (HH) 06/26/2017   BNP No results for input(s): PROBNP in the last 8760 hours. TSH Recent Labs    06/26/17 0936  TSH 0.604   CHOLESTEROL No results for input(s): CHOL in the  last 8760 hours.  Scheduled Meds: . Chlorhexidine Gluconate Cloth  6 each Topical Once  . enalapril  10 mg Oral Daily  . fluticasone  2 spray Each Nare Daily  . heparin  5,000 Units Subcutaneous Q8H  . ketotifen  1 drop Both Eyes BID  . metoprolol tartrate  50 mg Oral BID  . pantoprazole  40 mg Oral Daily   Continuous Infusions: . sodium chloride Stopped (10/04/17 1852)  . dextrose 5 % and 0.45% NaCl Stopped (10/06/17 0203)  . piperacillin-tazobactam (ZOSYN)  IV 3.375 g (10/06/17 0921)   PRN Meds:.acetaminophen **OR** acetaminophen, diphenhydrAMINE **OR** diphenhydrAMINE, gabapentin, HYDROmorphone (DILAUDID) injection, meclizine, methocarbamol, metoprolol tartrate, ondansetron **OR** ondansetron (ZOFRAN) IV,  oxybutynin, oxyCODONE, simethicone  Assessment/Plan: Acute cholecystitis SVT, paroxysmal Obesity Type 2 DM Hypertension  Awaiting surgery today.   LOS: 2 days    Dixie Dials  MD  10/06/2017, 11:37 AM

## 2017-10-06 NOTE — Anesthesia Preprocedure Evaluation (Addendum)
Anesthesia Evaluation  Patient identified by MRN, date of birth, ID band Patient awake    Reviewed: Allergy & Precautions, NPO status , Patient's Chart, lab work & pertinent test results  Airway Mallampati: II  TM Distance: >3 FB Neck ROM: Full    Dental no notable dental hx. (+) Teeth Intact, Dental Advisory Given   Pulmonary Current Smoker,    Pulmonary exam normal breath sounds clear to auscultation       Cardiovascular hypertension, Pt. on medications Normal cardiovascular exam+ dysrhythmias Supra Ventricular Tachycardia  Rhythm:Regular Rate:Normal  06/27/17 Echo Study Conclusions  - Left ventricle: The cavity size was normal. Systolic function was   normal. The estimated ejection fraction was in the range of 60%   to 65%. Wall motion was normal; there were no regional wall   motion abnormalities. Doppler parameters are consistent with   abnormal left ventricular relaxation (grade 1 diastolic   dysfunction). - Mitral valve: There was mild regurgitation. - Left atrium: The atrium was mildly dilated.   Neuro/Psych  Headaches,    GI/Hepatic   Endo/Other  diabetes  Renal/GU      Musculoskeletal   Abdominal   Peds  Hematology   Anesthesia Other Findings Spanich Speaking hx taken w ipad translator  Reproductive/Obstetrics                            Anesthesia Physical Anesthesia Plan  ASA: II  Anesthesia Plan: General   Post-op Pain Management:    Induction: Intravenous  PONV Risk Score and Plan: 4 or greater and Treatment may vary due to age or medical condition, Dexamethasone and Ondansetron  Airway Management Planned: Oral ETT  Additional Equipment:   Intra-op Plan:   Post-operative Plan: Extubation in OR  Informed Consent: I have reviewed the patients History and Physical, chart, labs and discussed the procedure including the risks, benefits and alternatives for the  proposed anesthesia with the patient or authorized representative who has indicated his/her understanding and acceptance.   Dental advisory given  Plan Discussed with: CRNA  Anesthesia Plan Comments:         Anesthesia Quick Evaluation

## 2017-10-06 NOTE — Anesthesia Procedure Notes (Signed)
Procedure Name: Intubation Date/Time: 10/06/2017 1:43 PM Performed by: Maxwell Caul, CRNA Pre-anesthesia Checklist: Patient identified, Emergency Drugs available, Suction available and Patient being monitored Patient Re-evaluated:Patient Re-evaluated prior to induction Oxygen Delivery Method: Circle system utilized Preoxygenation: Pre-oxygenation with 100% oxygen Induction Type: IV induction Laryngoscope Size: Mac and 3 Grade View: Grade I Tube type: Oral Tube size: 7.0 mm Number of attempts: 1 Airway Equipment and Method: Stylet Placement Confirmation: ETT inserted through vocal cords under direct vision,  positive ETCO2 and breath sounds checked- equal and bilateral Secured at: 21 cm Tube secured with: Tape Dental Injury: Teeth and Oropharynx as per pre-operative assessment

## 2017-10-06 NOTE — Progress Notes (Signed)
CMT alerted pt had run of SVT HR up to 160s. Pt asleep and asymptomatic. HR back down to 60s. VSS. Will continue to monitor.

## 2017-10-06 NOTE — Transfer of Care (Signed)
Immediate Anesthesia Transfer of Care Note  Patient: Shannon Andrade  Procedure(s) Performed: LAPAROSCOPIC CHOLECYSTECTOMY WITH INTRAOPERATIVE CHOLANGIOGRAM (N/A Abdomen)  Patient Location: PACU  Anesthesia Type:General  Level of Consciousness: awake, alert , oriented and patient cooperative  Airway & Oxygen Therapy: Patient Spontanous Breathing and Patient connected to face mask oxygen  Post-op Assessment: Report given to RN, Post -op Vital signs reviewed and stable and Patient moving all extremities X 4  Post vital signs: stable  Last Vitals:  Vitals Value Taken Time  BP 127/77 10/06/2017  3:00 PM  Temp 36.9 C 10/06/2017  2:59 PM  Pulse 62 10/06/2017  3:07 PM  Resp 10 10/06/2017  3:07 PM  SpO2 97 % 10/06/2017  3:07 PM  Vitals shown include unvalidated device data.  Last Pain:  Vitals:   10/06/17 1459  TempSrc:   PainSc: 0-No pain         Complications: No apparent anesthesia complications

## 2017-10-06 NOTE — Anesthesia Postprocedure Evaluation (Signed)
Anesthesia Post Note  Patient: Shannon Andrade Finger  Procedure(s) Performed: LAPAROSCOPIC CHOLECYSTECTOMY WITH INTRAOPERATIVE CHOLANGIOGRAM (N/A Abdomen)     Patient location during evaluation: PACU Anesthesia Type: General Level of consciousness: awake and alert Pain management: pain level controlled Vital Signs Assessment: post-procedure vital signs reviewed and stable Respiratory status: spontaneous breathing, nonlabored ventilation, respiratory function stable and patient connected to nasal cannula oxygen Cardiovascular status: blood pressure returned to baseline and stable Postop Assessment: no apparent nausea or vomiting Anesthetic complications: no    Last Vitals:  Vitals:   10/06/17 1459 10/06/17 1515  BP: 136/77 109/69  Pulse: 74 (!) 58  Resp: (!) 21 10  Temp: 36.9 C   SpO2: 100% 97%    Last Pain:  Vitals:   10/06/17 1515  TempSrc:   PainSc: Reynolds A Charmion Hapke

## 2017-10-07 ENCOUNTER — Encounter (HOSPITAL_COMMUNITY): Payer: Self-pay | Admitting: General Surgery

## 2017-10-07 MED ORDER — ACETAMINOPHEN 500 MG PO TABS: 1000.0000 mg | ORAL_TABLET | Freq: Four times a day (QID) | ORAL | 0 refills | Status: DC

## 2017-10-07 NOTE — Plan of Care (Signed)
Discharge instructions reviewed with patient utilizing spanish interpreter (video).  Questions answered, verbalized understanding.  Patient given written discharge instructions in Spanish and [place].  Patient transported via wheelchair to main entrance of hospital to be taken home by son.  All belongings in patients possession at time of discharge.  No prescriptions given.

## 2017-10-07 NOTE — Discharge Summary (Signed)
Patient ID: Shannon Andrade [id number] 62 y.o. 09-Apr-1955  10/04/2017  Discharge date and time: 10/07/2017   Admitting Physician: Edward Jolly  Discharge Physician: Edward Jolly  Admission Diagnoses: Acute cholecystitis [K81.0] Chills [R68.83] Nausea [R11.0] Generalized abdominal pain [R10.84] RUQ abdominal pain [R10.11] Acute calculous cholecystitis [K80.00]  Discharge Diagnoses: Same  Operations: Procedure(s): LAPAROSCOPIC CHOLECYSTECTOMY WITH INTRAOPERATIVE CHOLANGIOGRAM  Admission Condition: fair  Discharged Condition: good  Indication for Admission: 62-year-old female with history of gastric bypass, SVT, appendectomy age 64, hypertension, 3 cm angiomyolipoma of right kidney. Presents with postprandial right upper quadrant pain, right back pain, nausea, no vomiting, some diarrhea.diarrhea.  No significant prior episodes   Exam reveals pleasant cooperative woman in minimal distress. Abdomen soft with right upper quadrant tenderness and guarding but no mass  WBC 12,000.  LFTs and lipase normal. CT shows distended gallbladder with slight inflammatory changes and some stones at the neck. Borderline CBD dilatation  Hospital Course: Patient was admitted.  Ultrasound showed thickening of the gallbladder and trace fluid consistent with cholecystitis as well as dilated bile ducts.  MRCP showed dilated extrahepatic ducts without stones or etiology.  Patient then underwent laparoscopic cholecystectomy with cholangiogram showing again dilated extrahepatic bile ducts but no stone or apparent mass.  The following morning she is feeling well.  Pain is relieved.  Abdomen is soft and nontender.  Wounds healing well.  She is felt ready for discharge.   Disposition: Home  Patient Instructions:  Allergies as of 10/07/2017   No Known Allergies     Medication List    TAKE these medications   acetaminophen 500 MG tablet Commonly known as:  TYLENOL Take 2 tablets  (1,000 mg total) by mouth every 6 (six) hours. What changed:    medication strength  how much to take  when to take this  reasons to take this   azelastine 0.05 % ophthalmic solution Commonly known as:  OPTIVAR Place 1 drop into both eyes 2 (two) times daily.   DUEXIS 800-26.6 MG Tabs Generic drug:  Ibuprofen-Famotidine Take 1 tablet by mouth daily.   enalapril 10 MG tablet Commonly known as:  VASOTEC Take 10 mg by mouth daily.   ferrous sulfate 325 (65 FE) MG tablet Take 325 mg by mouth daily with breakfast.   fluticasone 50 MCG/ACT nasal spray Commonly known as:  FLONASE PLACE 2 SPRAYS INTO BOTH NOSTRILS DAILY. What changed:  See the new instructions.   gabapentin 300 MG capsule Commonly known as:  NEURONTIN Take 1 capsule (300 mg total) by mouth 3 (three) times daily. What changed:    when to take this  reasons to take this   meclizine 25 MG tablet Commonly known as:  ANTIVERT Take 1 tablet (25 mg total) by mouth 3 (three) times daily as needed for dizziness. What changed:  when to take this   metoprolol tartrate 50 MG tablet Commonly known as:  LOPRESSOR Take 1 tablet (50 mg total) by mouth 2 (two) times daily.   multivitamin with minerals tablet Take 1 tablet by mouth daily.   oxybutynin 10 MG 24 hr tablet Commonly known as:  DITROPAN-XL Take 1 tablet (10 mg total) by mouth at bedtime. What changed:    when to take this  reasons to take this   PENNSAID 2 % Soln Generic drug:  Diclofenac Sodium Apply 1 application topically daily as needed (pain).   PRILOSEC 40 MG capsule Generic drug:  omeprazole Take 40 mg by mouth daily.   ranitidine  150 MG tablet Commonly known as:  ZANTAC Take 150 mg by mouth 2 (two) times daily as needed for heartburn.   sertraline 25 MG tablet Commonly known as:  ZOLOFT Take 25 mg by mouth daily.   sucralfate 1 g tablet Commonly known as:  CARAFATE Take 1 g by mouth 2 (two) times daily as needed  (indigestion).       Activity: activity as tolerated Diet: Post gastric bypass Wound Care: none needed  Follow-up:  With Dr. Narda Amber surgery in 3 weeks.  Signed: Edward Jolly MD, FACS  10/07/2017, 8:55 AM

## 2017-10-07 NOTE — Progress Notes (Signed)
Subjective:  Patient denies and chest pain or shortness of breath. Denies abdominal pain. Patient being discharged for surgery home.  Objective:  Vital Signs in the last 24 hours: Temp:  [97.7 F (36.5 C)-98.8 F (37.1 C)] 98.8 F (37.1 C) (08/10 0540) Pulse Rate:  [54-74] 56 (08/10 0540) Resp:  [10-21] 18 (08/10 0540) BP: (105-156)/(66-94) 138/80 (08/10 0540) SpO2:  [95 %-100 %] 97 % (08/10 0540) Weight:  [103.9 kg] 103.9 kg (08/09 1209)  Intake/Output from previous day: 08/09 0701 - 08/10 0700 In: 2734.5 [P.O.:240; I.V.:2381.8; IV Piggyback:112.7] Out: 0  Intake/Output from this shift: No intake/output data recorded.  Physical Exam: Neck: no adenopathy, no carotid bruit, no JVD and supple, symmetrical, trachea midline Lungs: clear to auscultation bilaterally Heart: regular rate and rhythm, S1, S2 normal and soft systolic murmur noted Abdomen: soft bowel sounds present nontender surgical wounds healing Extremities: extremities normal, atraumatic, no cyanosis or edema  Lab Results: Recent Labs    10/05/17 0433 10/06/17 0625  WBC 9.8 7.0  HGB 13.2 13.5  PLT 246 223   Recent Labs    10/05/17 0433 10/06/17 0625  NA 138 142  K 4.2 4.0  CL 106 108  CO2 26 25  GLUCOSE 108* 98  BUN 7* 7*  CREATININE 0.47 0.56   No results for input(s): TROPONINI in the last 72 hours.  Invalid input(s): CK, MB Hepatic Function Panel Recent Labs    10/04/17 1404  10/06/17 0625  PROT 7.4   < > 6.5  ALBUMIN 3.9   < > 3.5  AST 26   < > 41  ALT 19   < > 49*  ALKPHOS 89   < > 106  BILITOT 1.2   < > 0.8  BILIDIR 0.3*  --   --   IBILI 0.9  --   --    < > = values in this interval not displayed.   No results for input(s): CHOL in the last 72 hours. No results for input(s): PROTIME in the last 72 hours.  Imaging: Imaging results have been reviewed and Dg Cholangiogram Operative  Result Date: 10/06/2017 CLINICAL DATA:  Intraoperative cholangiogram during laparoscopic  cholecystectomy. EXAM: INTRAOPERATIVE CHOLANGIOGRAM FLUOROSCOPY TIME:  32 seconds (11 mGy) COMPARISON:  MRCP-10/05/2017 FINDINGS: Intraoperative cholangiographic images of the right upper abdominal quadrant during laparoscopic cholecystectomy are provided for review. Surgical clips overlie the expected location of the gallbladder fossa. Contrast injection demonstrates selective cannulation of the central aspect of the cystic duct. There is passage of contrast through the central aspect of the cystic duct with filling of a mildly dilated common bile duct. There is passage of contrast though the CBD and into the descending portion of the duodenum. There is minimal reflux of injected contrast into the common hepatic duct and central aspect of the non dilated intrahepatic biliary system. There are no discrete filling defects within the opacified portions of the biliary system to suggest the presence of choledocholithiasis. IMPRESSION: No evidence of choledocholithiasis. Electronically Signed   By: Sandi Mariscal M.D.   On: 10/06/2017 14:57   Mr 3d Recon At Scanner  Result Date: 10/05/2017 CLINICAL DATA:  61 year old female with history of severe abdominal and mid back pain. Right lower quadrant abdominal pain. History of gastric bypass and appendectomy. EXAM: MRI ABDOMEN WITHOUT AND WITH CONTRAST (INCLUDING MRCP) TECHNIQUE: Multiplanar multisequence MR imaging of the abdomen was performed both before and after the administration of intravenous contrast. Heavily T2-weighted images of the biliary and pancreatic ducts were  obtained, and three-dimensional MRCP images were rendered by post processing. CONTRAST:  65mL MULTIHANCE GADOBENATE DIMEGLUMINE 529 MG/ML IV SOLN COMPARISON:  Abdominal MRI 06/11/2013. CT the abdomen and pelvis 10/04/2017. Abdominal ultrasound 10/04/2017. FINDINGS: Lower chest: Unremarkable. Hepatobiliary: Mild diffuse loss of signal intensity throughout the hepatic parenchyma on out of phase dual echo  images, indicative of mild hepatic steatosis. No suspicious cystic or solid hepatic lesions. MRCP images demonstrate no intrahepatic biliary ductal dilatation. Common bile duct is dilated measuring up to 13 mm in the porta hepatis. No filling defect in the common bile duct to suggest choledocholithiasis. There are tiny filling defects lying dependently in the gallbladder, compatible with gallstones. Gallbladder does not appear distended. Mild diffuse gallbladder wall edema. No frank pericholecystic fluid. Pancreas: No pancreatic mass. No pancreatic ductal dilatation noted on MRCP images. No pancreatic or peripancreatic fluid or inflammatory changes. Spleen:  Unremarkable. Adrenals/Urinary Tract: Exophytic lesion measuring 3 cm in the lateral aspect of the right kidney at junction of upper and interpolar region which demonstrates internal areas with etching artifact on out of phase dual echo images, compatible with internal macroscopic lipid. This lesion is generally T1 isointense, and heterogeneous in T2 signal intensity with both hypointensity and slight hyperintensity, restricts diffusion, and demonstrates avid enhancement on post gadolinium imaging, compatible with a lipid poor angiomyolipoma. In the medial aspect of the upper pole the left kidney there is again a 1.3 cm T1 hyperintense, T2 hypointense, nonenhancing lesion (axial image 40 of series 18), compatible with a proteinaceous/hemorrhagic cyst. No other suspicious renal lesions. No hydroureteronephrosis in the visualized portions of the abdomen. Bilateral adrenal glands are normal in appearance. Stomach/Bowel: Visualized portions are unremarkable. Vascular/Lymphatic: No aneurysm identified in the visualized abdominal vasculature. No lymphadenopathy noted in the abdomen. Other: No significant volume of ascites noted in the visualized portions of the peritoneal cavity. Musculoskeletal: No aggressive appearing osseous lesions are noted in the visualized  portions of the skeleton. IMPRESSION: 1. Cholelithiasis with diffuse gallbladder wall edema. However, the gallbladder does not appear distended and there is no frank pericholecystic fluid on today's examination. Findings are considered equivocal for an acute cholecystitis and further clinical evaluation is recommended. 2. Moderate common bile duct dilatation. This is unusual, as there is no associated intrahepatic biliary ductal dilatation. Additionally, there is no choledocholithiasis. No pancreatic ductal dilatation. No findings to suggest obstructing pancreatic head mass. The possibility of a distal common bile duct stricture could be considered, however, this is not readily apparent by MRCP. 3. 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 4. 1.3 cm proteinaceous/hemorrhagic cyst in the upper pole the left kidney. These results will be called to the ordering clinician or representative by the Radiologist Assistant, and communication documented in the PACS or zVision Dashboard. Electronically Signed   By: Vinnie Langton M.D.   On: 10/05/2017 21:15   Mr Abdomen Mrcp Moise Boring Contast  Result Date: 10/05/2017 CLINICAL DATA:  62 year old female with history of severe abdominal and mid back pain. Right lower quadrant abdominal pain. History of gastric bypass and appendectomy. EXAM: MRI ABDOMEN WITHOUT AND WITH CONTRAST (INCLUDING MRCP) TECHNIQUE: Multiplanar multisequence MR imaging of the abdomen was performed both before and after the administration of intravenous contrast. Heavily T2-weighted images of the biliary and pancreatic ducts were obtained, and three-dimensional MRCP images were rendered by post processing. CONTRAST:  90mL MULTIHANCE GADOBENATE DIMEGLUMINE 529 MG/ML IV SOLN COMPARISON:  Abdominal MRI 06/11/2013. CT the abdomen and pelvis 10/04/2017. Abdominal ultrasound 10/04/2017. FINDINGS: Lower chest: Unremarkable.  Hepatobiliary: Mild diffuse loss of signal intensity throughout the hepatic  parenchyma on out of phase dual echo images, indicative of mild hepatic steatosis. No suspicious cystic or solid hepatic lesions. MRCP images demonstrate no intrahepatic biliary ductal dilatation. Common bile duct is dilated measuring up to 13 mm in the porta hepatis. No filling defect in the common bile duct to suggest choledocholithiasis. There are tiny filling defects lying dependently in the gallbladder, compatible with gallstones. Gallbladder does not appear distended. Mild diffuse gallbladder wall edema. No frank pericholecystic fluid. Pancreas: No pancreatic mass. No pancreatic ductal dilatation noted on MRCP images. No pancreatic or peripancreatic fluid or inflammatory changes. Spleen:  Unremarkable. Adrenals/Urinary Tract: Exophytic lesion measuring 3 cm in the lateral aspect of the right kidney at junction of upper and interpolar region which demonstrates internal areas with etching artifact on out of phase dual echo images, compatible with internal macroscopic lipid. This lesion is generally T1 isointense, and heterogeneous in T2 signal intensity with both hypointensity and slight hyperintensity, restricts diffusion, and demonstrates avid enhancement on post gadolinium imaging, compatible with a lipid poor angiomyolipoma. In the medial aspect of the upper pole the left kidney there is again a 1.3 cm T1 hyperintense, T2 hypointense, nonenhancing lesion (axial image 40 of series 18), compatible with a proteinaceous/hemorrhagic cyst. No other suspicious renal lesions. No hydroureteronephrosis in the visualized portions of the abdomen. Bilateral adrenal glands are normal in appearance. Stomach/Bowel: Visualized portions are unremarkable. Vascular/Lymphatic: No aneurysm identified in the visualized abdominal vasculature. No lymphadenopathy noted in the abdomen. Other: No significant volume of ascites noted in the visualized portions of the peritoneal cavity. Musculoskeletal: No aggressive appearing osseous  lesions are noted in the visualized portions of the skeleton. IMPRESSION: 1. Cholelithiasis with diffuse gallbladder wall edema. However, the gallbladder does not appear distended and there is no frank pericholecystic fluid on today's examination. Findings are considered equivocal for an acute cholecystitis and further clinical evaluation is recommended. 2. Moderate common bile duct dilatation. This is unusual, as there is no associated intrahepatic biliary ductal dilatation. Additionally, there is no choledocholithiasis. No pancreatic ductal dilatation. No findings to suggest obstructing pancreatic head mass. The possibility of a distal common bile duct stricture could be considered, however, this is not readily apparent by MRCP. 3. 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 4. 1.3 cm proteinaceous/hemorrhagic cyst in the upper pole the left kidney. These results will be called to the ordering clinician or representative by the Radiologist Assistant, and communication documented in the PACS or zVision Dashboard. Electronically Signed   By: Vinnie Langton M.D.   On: 10/05/2017 21:15    Cardiac Studies:  Assessment/Plan:  Acute cholecystitis status post laparoscopic cholecystectomy with intraoperative cholangiogram SVT, paroxysmal Obesity Type 2 DM Hypertension Plan DC home  Follow with surgery as scheduled  Follow-up with Dr. Doylene Canard in 1-2 weeks  LOS: 3 days    Charolette Forward 10/07/2017, 9:34 AM

## 2017-10-09 LAB — CULTURE, BLOOD (ROUTINE X 2)
CULTURE: NO GROWTH
Culture: NO GROWTH
SPECIAL REQUESTS: ADEQUATE

## 2019-08-04 IMAGING — CT CT CHEST W/ CM
2 of 3 series · 15 of 36 positions shown, 18 images · IV contrast (APPLIED)
Comparison: July 08, 2014 chest CT.  June 26, 2017 chest x-ray.

CLINICAL DATA: Chest pain and diaphoresis.  Follow-up chest x-ray.

EXAM:
CT CHEST WITH CONTRAST
TECHNIQUE: Multidetector CT imaging of the chest was performed during
intravenous contrast administration.
CONTRAST:  75mL ROX1MI-9PP IOPAMIDOL (ROX1MI-9PP) INJECTION 61%

[Series 3: thorax 2.0 i31f 2 · axial · 0.70mm/px · z∈[+1140,+1388]mm · 12 of 146 slices shown, 15 images]
[im 11/146  mediastinal]
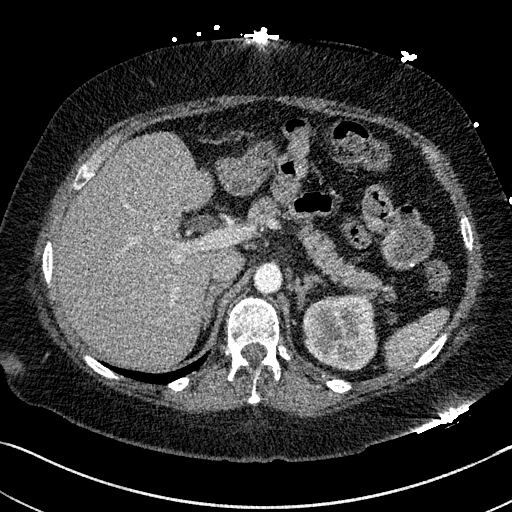
[im 11/146  lung]
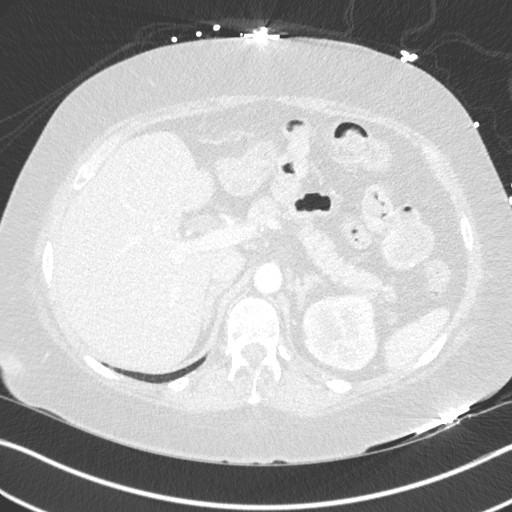
[im 22/146  lung]
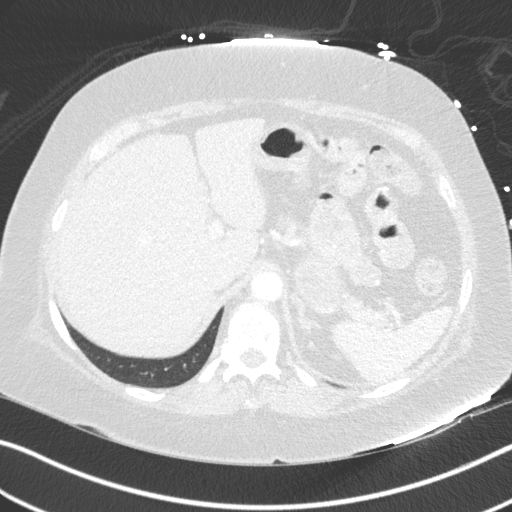
[im 33/146  lung]
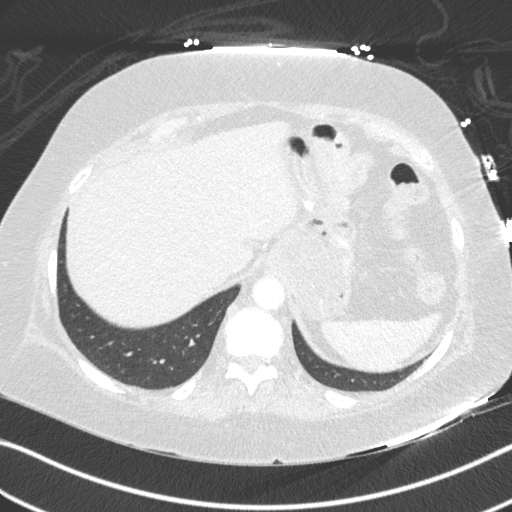
[im 43/146  lung]
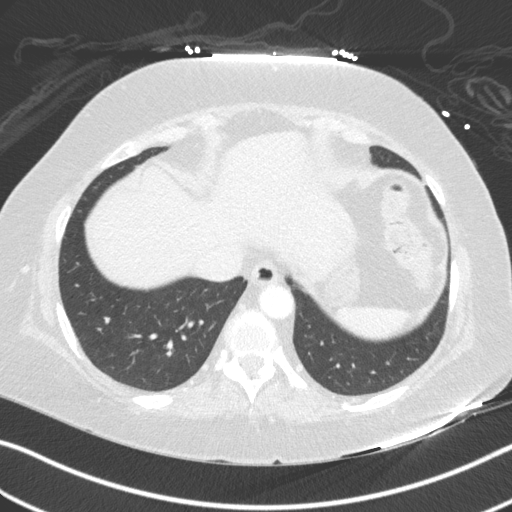
[im 54/146  mediastinal]
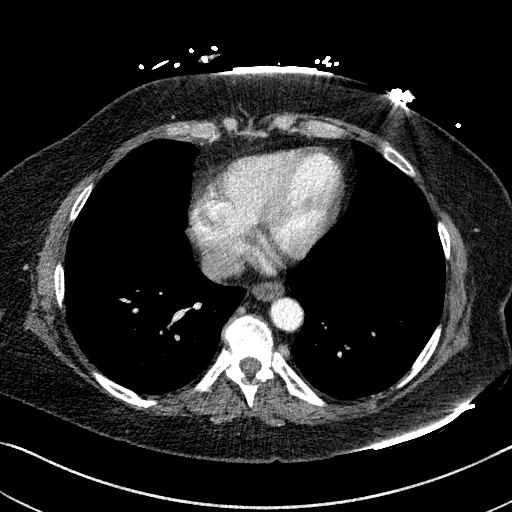
[im 54/146  lung]
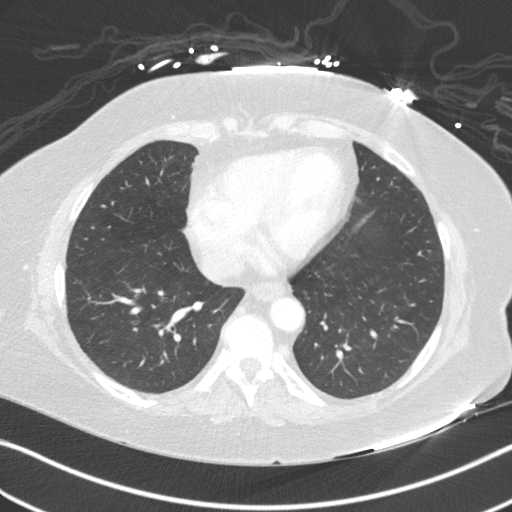
[im 65/146  lung]
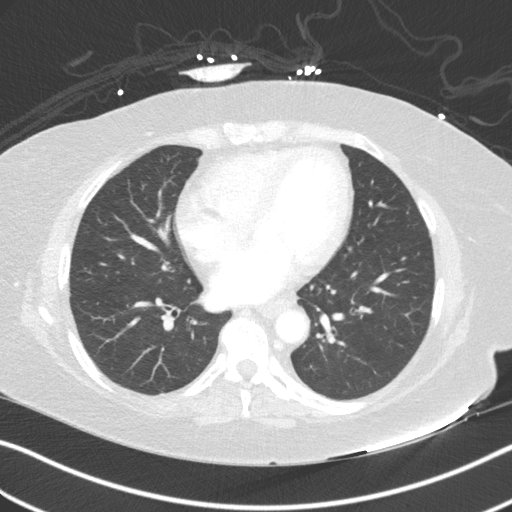
[im 81/146  lung]
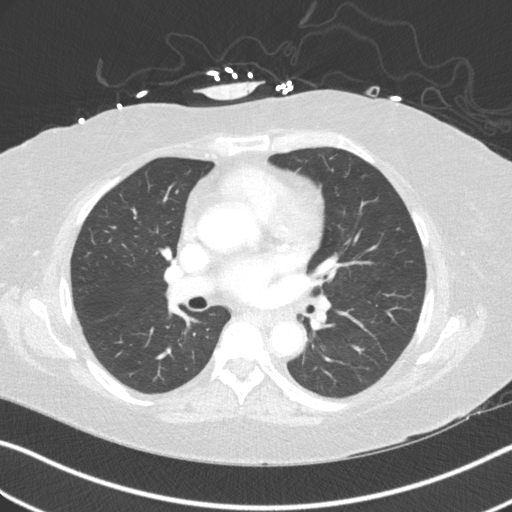
[im 92/146  lung]
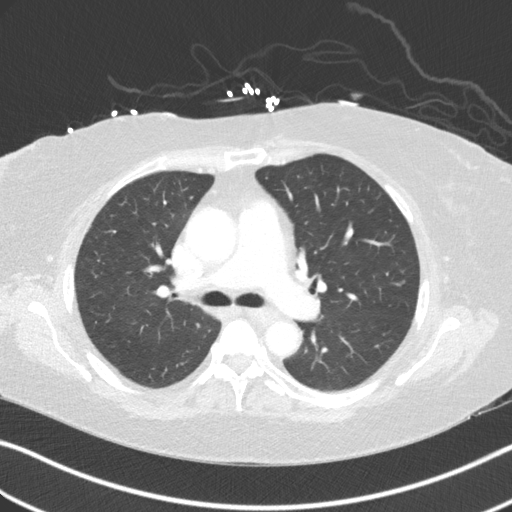
[im 103/146  mediastinal]
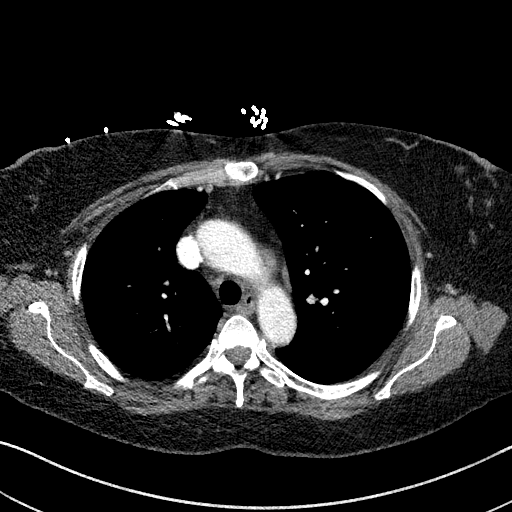
[im 103/146  lung]
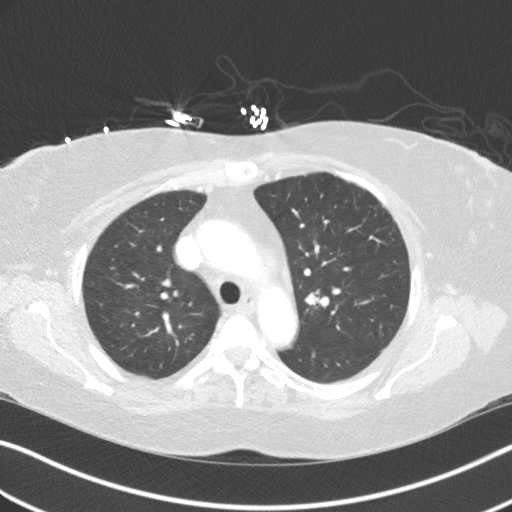
[im 113/146  lung]
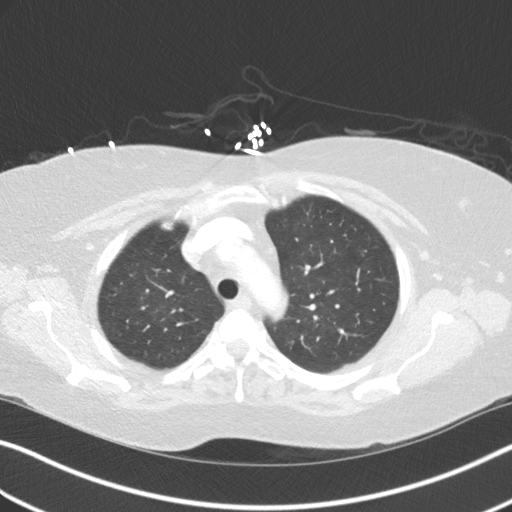
[im 124/146  lung]
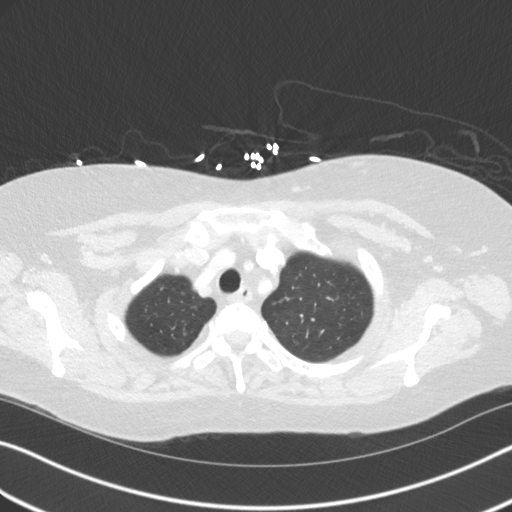
[im 135/146  lung]
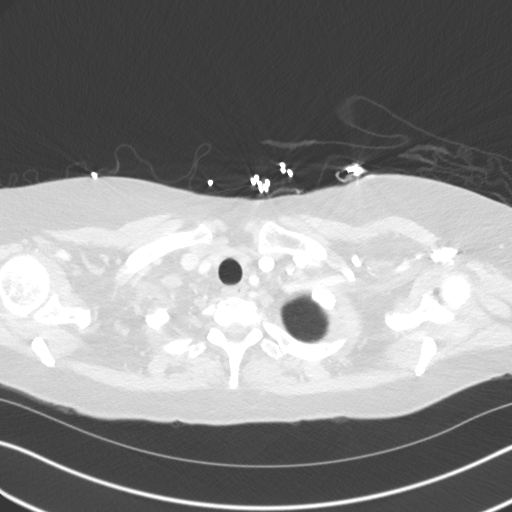

[Series 5: coronal · coronal · 0.58mm/px · 3 of 136 slices shown]
[im 28/136  lung]
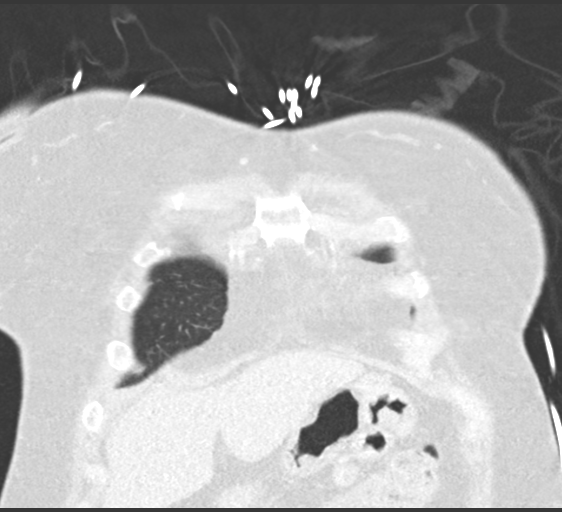
[im 55/136  lung]
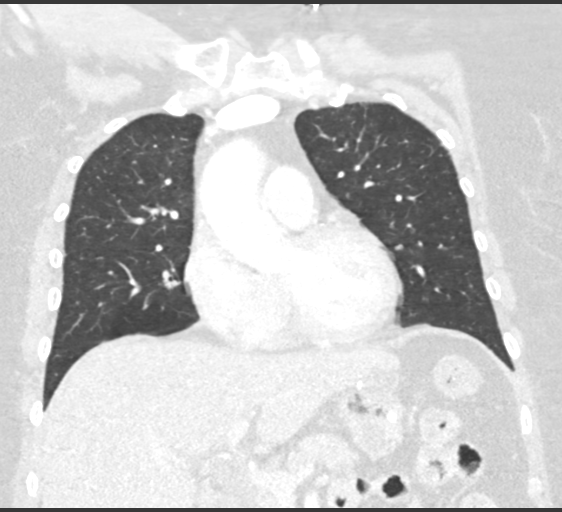
[im 82/136  lung]
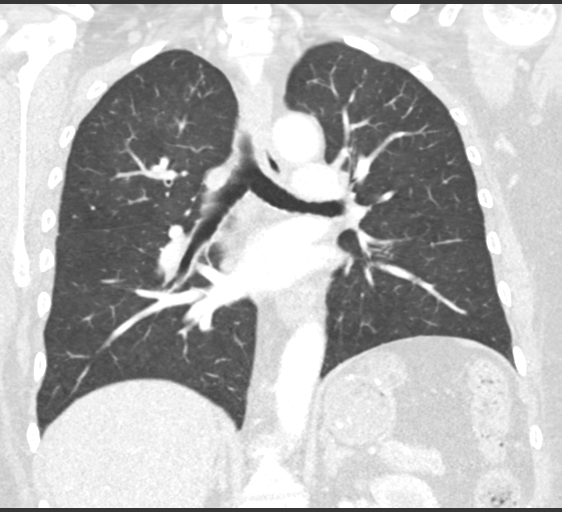

[15 of 36 positions shown; findings below may reference images not displayed]

FINDINGS: Cardiovascular: Coronary artery calcifications are identified. Heart
size is borderline. The main pulmonary artery measures 3.3 cm. The
study is not tailored to evaluate pulmonary emboli but none are
seen. The thoracic aorta is nonaneurysmal with no dissection or
atherosclerotic change.

Mediastinum/Nodes: No enlarged mediastinal, hilar, or axillary lymph
nodes. Thyroid gland, trachea, and esophagus demonstrate no
significant findings.

Lungs/Pleura: Lungs are clear. No pleural effusion or pneumothorax.

Upper Abdomen: Previous gastric surgery. No other abnormalities in
the upper abdomen.

Musculoskeletal: No chest wall abnormality. No acute or significant
osseous findings.
IMPRESSION: 1. No cause for mediastinal widening identified. No adenopathy or
aneurysm.
2. Mild enlargement of the main pulmonary artery measuring 3.3 cm
could be seen with pulmonary arterial hypertension.
3. No other significant abnormalities.

## 2019-11-12 IMAGING — CT CT ABD-PELV W/ CM
2 of 4 series · 16 of 46 positions shown, 18 images · IV contrast (ISOVUE)
Comparison: Abdominal ultrasound April 04, 2017 and MRI of the
abdomen June 11, 2013.

CLINICAL DATA: Severe abdominal and mid back pain, RIGHT lower
quadrant pain. History of gastric bypass, appendectomy, kidney
stones, RIGHT renal AML.

EXAM:
CT ABDOMEN AND PELVIS WITH CONTRAST
TECHNIQUE: Multidetector CT imaging of the abdomen and pelvis was performed
using the standard protocol following bolus administration of
intravenous contrast.
CONTRAST:  100 mL 0CBAND-COO IOPAMIDOL (0CBAND-COO) INJECTION 61%

[Series 2: axial st · axial · 0.97mm/px · z∈[+1068,+1502]mm · 13 of 95 slices shown, 15 images]
[im 4/95  soft-tissue]
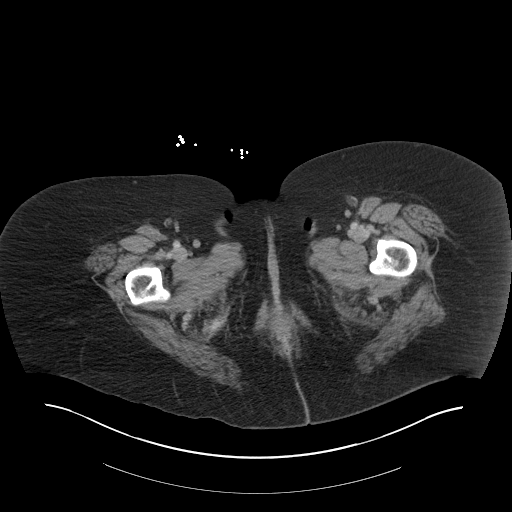
[im 4/95  bone]
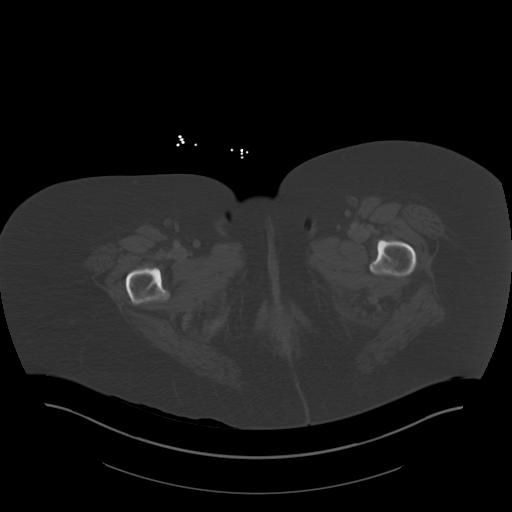
[im 12/95  soft-tissue]
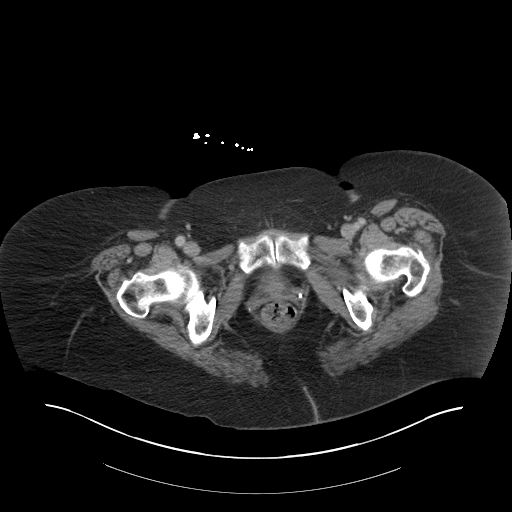
[im 20/95  soft-tissue]
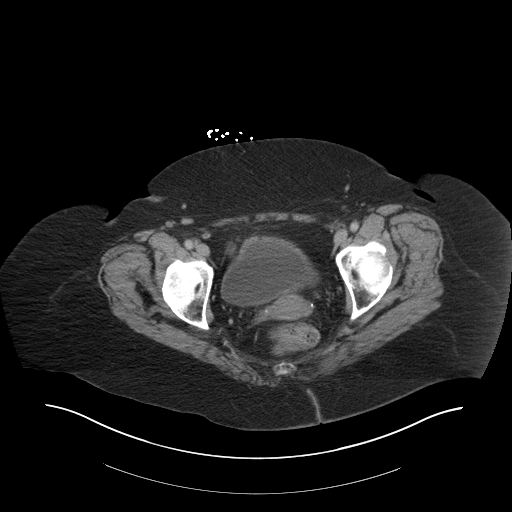
[im 28/95  soft-tissue]
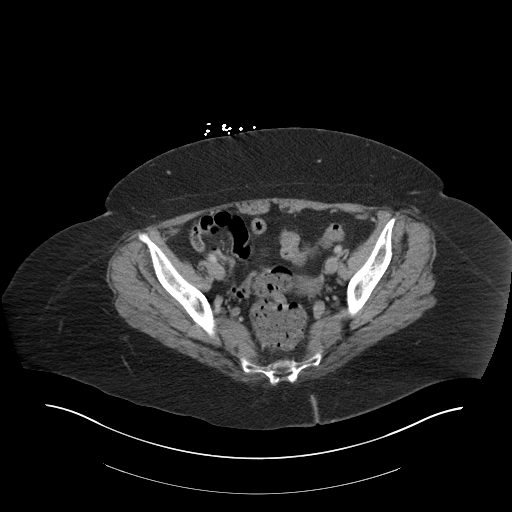
[im 32/95  soft-tissue]
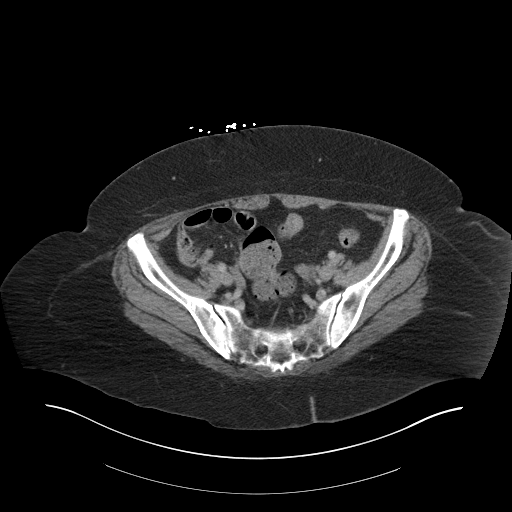
[im 40/95  soft-tissue]
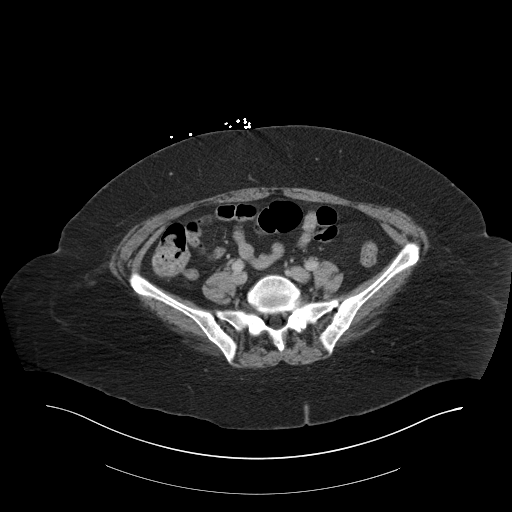
[im 48/95  soft-tissue]
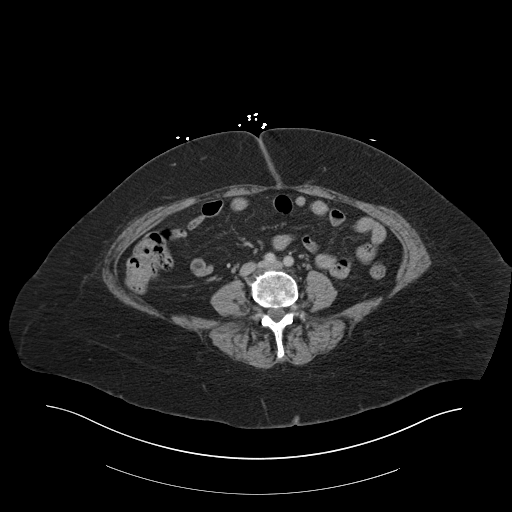
[im 55/95  soft-tissue]
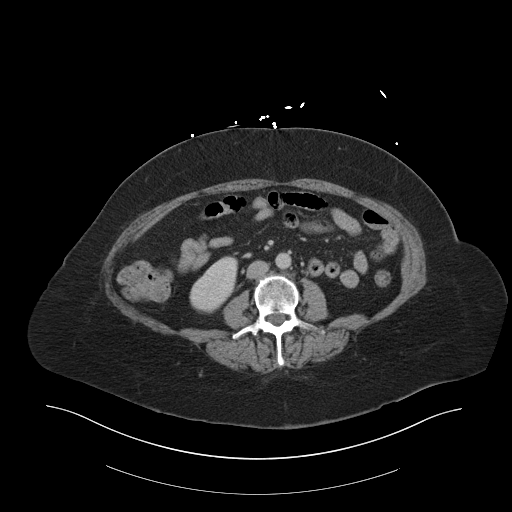
[im 63/95  soft-tissue]
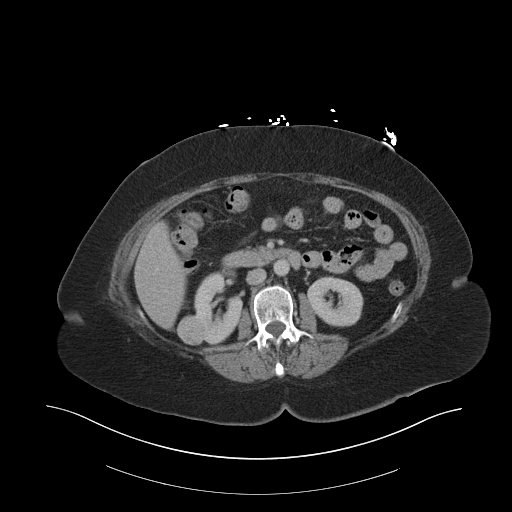
[im 63/95  bone]
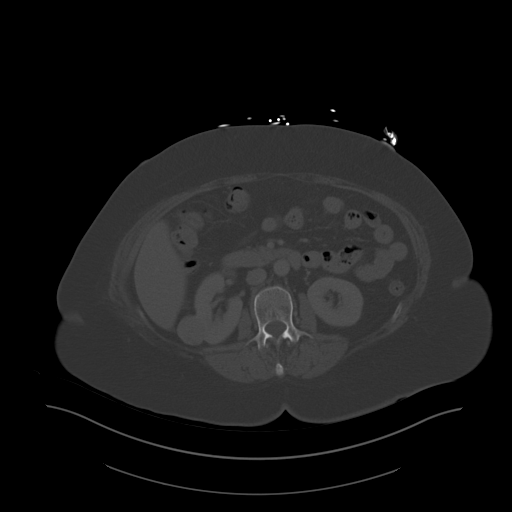
[im 67/95  soft-tissue]
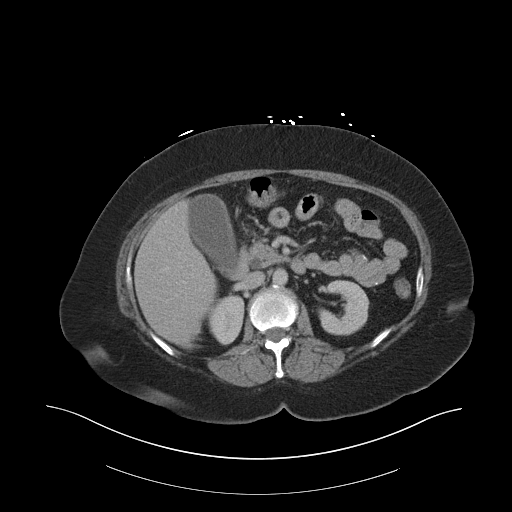
[im 75/95  soft-tissue]
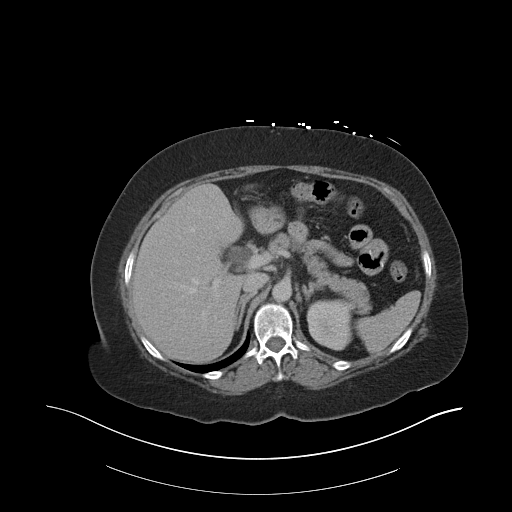
[im 83/95  soft-tissue]
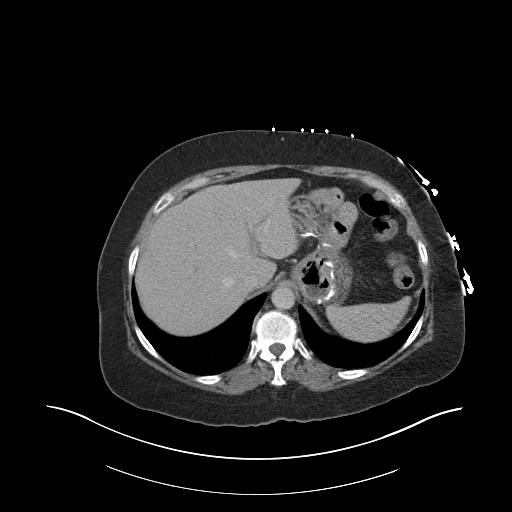
[im 91/95  soft-tissue]
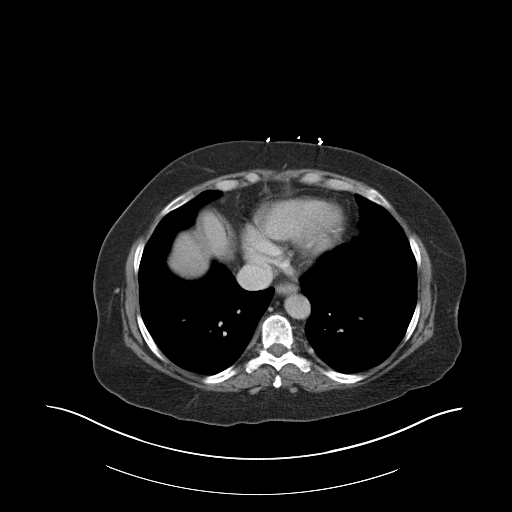

[Series 5: coronal st · coronal · 0.90mm/px · 3 of 100 slices shown]
[im 34/100  soft-tissue]
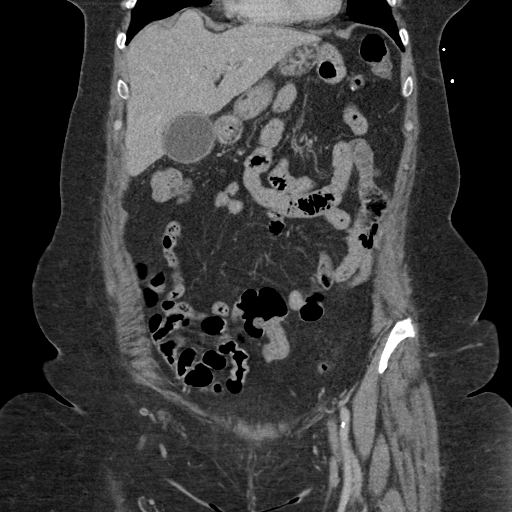
[im 45/100  soft-tissue]
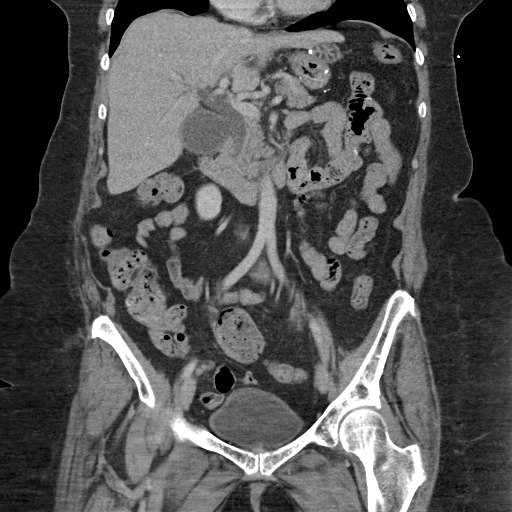
[im 56/100  soft-tissue]
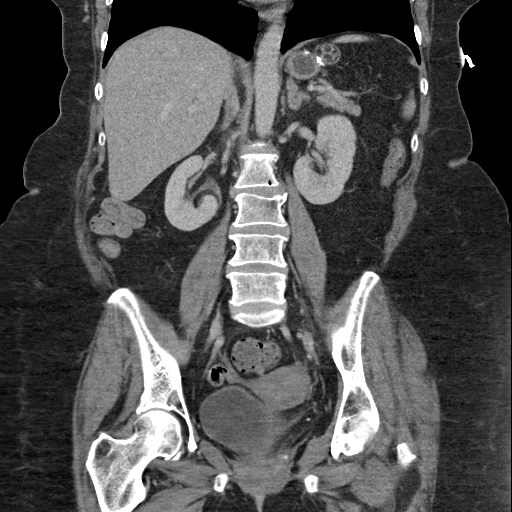

[16 of 46 positions shown; findings below may reference images not displayed]

FINDINGS: LOWER CHEST: Lung bases are clear. Included heart size is normal. No
pericardial effusion.

HEPATOBILIARY: Mild gallbladder distension, trace pericholecystic
fluid. 3 mm dense gallstone at the neck. Common bile duct is 12 mm
without choledocholithiasis. Mild intrahepatic biliary dilatation,
liver otherwise unremarkable.

PANCREAS: Normal.

SPLEEN: Normal.

ADRENALS/URINARY TRACT: Kidneys are orthotopic, demonstrating
symmetric enhancement. No nephrolithiasis or hydronephrosis. 3 cm
RIGHT renal mass previously characterized as angiomyolipoma. The
unopacified ureters are normal in course and caliber. Delayed
imaging through the kidneys demonstrates symmetric prompt contrast
excretion within the proximal urinary collecting system. Urinary
bladder is partially distended and unremarkable. Normal adrenal
glands.

STOMACH/BOWEL: Status post gastric bypass. Small and large bowel are
normal in course and caliber without inflammatory changes. Mild
amount of retained large bowel stool.

VASCULAR/LYMPHATIC: Aortoiliac vessels are normal in course and
caliber. Mild calcific atherosclerosis. No lymphadenopathy by CT
size criteria.

REPRODUCTIVE: Normal.

OTHER: No intraperitoneal free fluid or free air.

MUSCULOSKELETAL: Nonacute. Bilateral gluteal injection granulomas.
Osteopenia. Scattered bone islands. Mild old L3 compression fracture
with less than 25% height loss.
IMPRESSION: 1. Acute cholecystitis versus recently passed gallstone, ultrasound
may be more definitive.
2. 3 cm RIGHT renal mass previously characterized as AML.

Aortic Atherosclerosis ([id number]-[id number].D).

## 2021-11-20 ENCOUNTER — Other Ambulatory Visit: Payer: Self-pay

## 2021-11-20 DIAGNOSIS — I10 Essential (primary) hypertension: Secondary | ICD-10-CM | POA: Diagnosis not present

## 2021-11-20 DIAGNOSIS — Z79899 Other long term (current) drug therapy: Secondary | ICD-10-CM | POA: Diagnosis not present

## 2021-11-20 DIAGNOSIS — E119 Type 2 diabetes mellitus without complications: Secondary | ICD-10-CM | POA: Diagnosis not present

## 2021-11-20 DIAGNOSIS — M25461 Effusion, right knee: Secondary | ICD-10-CM | POA: Insufficient documentation

## 2021-11-20 DIAGNOSIS — M25561 Pain in right knee: Secondary | ICD-10-CM | POA: Diagnosis not present

## 2021-11-20 DIAGNOSIS — F17210 Nicotine dependence, cigarettes, uncomplicated: Secondary | ICD-10-CM | POA: Diagnosis not present

## 2021-11-20 NOTE — ED Triage Notes (Signed)
Pt arrived via EMS, per EMS having right knee pain that started this afternoon, no injury noted per EMS, h/o same with dx of arthritis. VS wnl

## 2021-11-21 ENCOUNTER — Emergency Department (HOSPITAL_BASED_OUTPATIENT_CLINIC_OR_DEPARTMENT_OTHER)
Admission: EM | Admit: 2021-11-21 | Discharge: 2021-11-21 | Disposition: A | Discharge status: Home / Self Care | Payer: Medicare (Managed Care) | Attending: Emergency Medicine | Admitting: Emergency Medicine

## 2021-11-21 ENCOUNTER — Encounter (HOSPITAL_BASED_OUTPATIENT_CLINIC_OR_DEPARTMENT_OTHER): Payer: Self-pay | Admitting: Emergency Medicine

## 2021-11-21 ENCOUNTER — Emergency Department (HOSPITAL_BASED_OUTPATIENT_CLINIC_OR_DEPARTMENT_OTHER): Payer: Medicare (Managed Care)

## 2021-11-21 ENCOUNTER — Other Ambulatory Visit: Payer: Self-pay

## 2021-11-21 DIAGNOSIS — M25561 Pain in right knee: Secondary | ICD-10-CM

## 2021-11-21 MED ORDER — KETOROLAC TROMETHAMINE 30 MG/ML IJ SOLN
30.0000 mg | Freq: Once | INTRAMUSCULAR | Status: AC
  Administered 2021-11-21: 30 mg via INTRAMUSCULAR
  Filled 2021-11-21: qty 1

## 2021-11-21 MED ORDER — TRAMADOL-ACETAMINOPHEN 37.5-325 MG PO TABS: 1.0000 | ORAL_TABLET | Freq: Four times a day (QID) | ORAL | 0 refills | Status: AC | PRN

## 2021-11-21 MED ORDER — MELOXICAM 15 MG PO TABS: ORAL_TABLET | ORAL | 0 refills | Status: AC

## 2021-11-21 NOTE — ED Provider Notes (Signed)
[facility] Provider Note: Georgena Spurling, MD, FACEP  CSN: [id number] MRN: [id number] ARRIVAL: 11/20/21 at 2346 ROOM: [place]  Knee Pain   HISTORY OF PRESENT ILLNESS  11/21/21 2:13 AM Shannon Andrade is a 66 y.o. female with a history of arthritis in the right knee.  Her pain worsened yesterday afternoon and she now rates it as a 10 out of 10, the pain is sharp in nature and worse with movement.  She is prescribed tramadol but does not like to take less it has Tylenol in with it.  She has not been taking any anti-inflammatory medications such as ibuprofen or naproxen.  She states the pain radiates proximally to her right thigh and she is unable to bear weight currently.  Past Medical History:  Diagnosis Date   Anemia    Angiomyolipoma of right kidney    Arthritis    "hands, feet, back, neck" (11/04/2013)   Chronic back pain    "neck down into lower back" (11/04/2013)   DDD (degenerative disc disease)    Depression    GERD (gastroesophageal reflux disease)    Hepatitis    "when I was pregnant; got the one that's not bad"    Hypertension    Kidney stones    Migraine    "q few months" (11/04/2013)   Paroxysmal SVT (supraventricular tachycardia) ([place])    Tachycardia    Type II diabetes mellitus ([place])    "took Metformin"; resolved S/P gastric bypass    Past Surgical History:  Procedure Laterality Date   APPENDECTOMY  ~ [place] N/A 10/06/2017   Procedure: LAPAROSCOPIC CHOLECYSTECTOMY WITH INTRAOPERATIVE CHOLANGIOGRAM;  Surgeon: Fanny Skates, MD;  Location: [facility];  Service: General;  Laterality: N/A;   ESOPHAGOGASTRODUODENOSCOPY (EGD) WITH PROPOFOL N/A 11/28/2013   Procedure: ESOPHAGOGASTRODUODENOSCOPY (EGD) WITH PROPOFOL;  Surgeon: Inda Castle, MD;  Location: [facility];  Service: Endoscopy;  Laterality: N/A;   GASTRIC BYPASS  2008   KIDNEY STONE SURGERY  ~ 2004   "cut them out"   LEFT HEART CATH AND CORONARY ANGIOGRAPHY  N/A 06/27/2017   Procedure: LEFT HEART CATH AND CORONARY ANGIOGRAPHY;  Surgeon: Dixie Dials, MD;  Location: [place] [facility];  Service: Cardiovascular;  Laterality: N/A;   REDUCTION MAMMAPLASTY Bilateral ~ 2010    Family History  Problem Relation Age of Onset   Diabetes Mother    Heart disease Father     Social History   Tobacco Use   Smoking status: Some Days    Packs/day: 0.50    Years: 43.00    Total pack years: 21.50    Types: Cigarettes   Smokeless tobacco: Never  Vaping Use   Vaping Use: Never used  Substance Use Topics   Alcohol use: Yes    Alcohol/week: 8.0 standard drinks of alcohol    Types: 8 Cans of beer per week    Comment: occasional   Drug use: No    Prior to Admission medications   Medication Sig Start Date End Date Taking? Authorizing Provider  meloxicam (MOBIC) 15 MG tablet Take 1 tablet daily for knee pain. 11/21/21  Yes Gardner Servantes, MD  traMADol-acetaminophen (ULTRACET) 37.5-325 MG tablet Take 1 tablet by mouth every 6 (six) hours as needed for severe pain. 11/21/21  Yes Beola Vasallo, MD  azelastine (OPTIVAR) 0.05 % ophthalmic solution Place 1 drop into both eyes 2 (two) times daily.  08/24/17   [provider]  Diclofenac Sodium (PENNSAID) 2 %  SOLN Apply 1 application topically daily as needed (pain).    [provider]  enalapril (VASOTEC) 10 MG tablet Take 10 mg by mouth daily.    [provider]  ferrous sulfate 325 (65 FE) MG tablet Take 325 mg by mouth daily with breakfast.    [provider]  fluticasone (FLONASE) 50 MCG/ACT nasal spray PLACE 2 SPRAYS INTO BOTH NOSTRILS DAILY. Patient taking differently: PLACE 2 SPRAYS INTO BOTH NOSTRILS DAILY AS NEEDED FOR ALLERGIES 04/07/15   Funches, Adriana Mccallum, MD  gabapentin (NEURONTIN) 300 MG capsule Take 1 capsule (300 mg total) by mouth 3 (three) times daily. Patient taking differently: Take 300 mg by mouth 3 (three) times daily as needed (pain).  08/20/14   Lorayne Marek, MD   meclizine (ANTIVERT) 25 MG tablet Take 1 tablet (25 mg total) by mouth 3 (three) times daily as needed for dizziness. Patient taking differently: Take 25 mg by mouth 2 (two) times daily as needed for dizziness.  08/20/14   Lorayne Marek, MD  metoprolol tartrate (LOPRESSOR) 50 MG tablet Take 1 tablet (50 mg total) by mouth 2 (two) times daily. 06/27/17   Velna Ochs, MD  omeprazole (PRILOSEC) 40 MG capsule Take 40 mg by mouth daily.    [provider]  oxybutynin (DITROPAN-XL) 10 MG 24 hr tablet Take 1 tablet (10 mg total) by mouth at bedtime. Patient taking differently: Take 10 mg by mouth daily as needed (bladder spasms).  12/08/14   Funches, Adriana Mccallum, MD  ranitidine (ZANTAC) 150 MG tablet Take 150 mg by mouth 2 (two) times daily as needed for heartburn.    [provider]  sertraline (ZOLOFT) 25 MG tablet Take 25 mg by mouth daily.  09/11/17   [provider]  sucralfate (CARAFATE) 1 g tablet Take 1 g by mouth 2 (two) times daily as needed (indigestion).  08/14/17   [provider]    Allergies Patient has no known allergies.   REVIEW OF SYSTEMS  Negative except as noted here or in the History of Present Illness.   PHYSICAL EXAMINATION  Initial Vital Signs Blood pressure 112/86, pulse 70, temperature 98.3 F (36.8 C), temperature source Oral, resp. rate 18, height '5\' 7"'$  (1.702 m), weight 98.9 kg, SpO2 99 %.  Examination General: Well-developed, well-nourished female in no acute distress; appearance consistent with age of record HENT: normocephalic; atraumatic Eyes: Normal appearance Neck: supple Heart: regular rate and rhythm Lungs: clear to auscultation bilaterally Abdomen: soft; nondistended Extremities: No deformity; pain on movement of right knee with slight effusion noted, no erythema or warmth Neurologic: Awake, alert and oriented; motor function intact in all extremities and symmetric; no facial droop Skin: Warm and dry Psychiatric:  Normal mood and affect   RESULTS  Summary of this visit's results, reviewed and interpreted by myself:   EKG Interpretation  Date/Time:    Ventricular Rate:    PR Interval:    QRS Duration:   QT Interval:    QTC Calculation:   R Axis:     Text Interpretation:         Laboratory Studies: No results found for this or any previous visit (from the past 24 hour(s)). Imaging Studies: DG Knee Complete 4 Views Right  Result Date: 11/21/2021 CLINICAL DATA:  Knee pain EXAM: RIGHT KNEE - COMPLETE 4+ VIEW COMPARISON:  06/18/2014 FINDINGS: Joint space narrowing and spurring. No acute bony abnormality. Specifically, no fracture, subluxation, or dislocation. Small joint effusion. IMPRESSION: Mild degenerative changes.  Small joint effusion. No  acute bony abnormality. Electronically Signed   By: Rolm Baptise M.D.   On: 11/21/2021 01:22    ED COURSE and MDM  Nursing notes, initial and subsequent vitals signs, including pulse oximetry, reviewed and interpreted by myself.  Vitals:   11/21/21 0017 11/21/21 0018 11/21/21 0136  BP: 112/86    Pulse: 70    Resp: 18    Temp:   98.3 F (36.8 C)  TempSrc:   Oral  SpO2: 99%    Weight:  98.9 kg   Height:  '5\' 7"'$  (1.702 m)    Medications  ketorolac (TORADOL) 30 MG/ML injection 30 mg (has no administration in time range)    The patient's pain could be caused by an exacerbation of osteoarthritis or she could have acute gout of the right knee.  The patient's body habitus and the small size of the effusion makes me hesitant to attempt an arthrocentesis at this time.  We will treat her with an anti-inflammatory (Mobic, her creatinine is within normal limits) and Ultracet and refer to Dr. Raeford Razor of sports medicine for further evaluation and treatment.  PROCEDURES  Procedures   ED DIAGNOSES     ICD-10-CM   1. Acute pain of right knee  M25.561          Dwight Adamczak, Jenny Reichmann, MD 11/21/21 [phone]

## 2021-11-21 NOTE — ED Triage Notes (Signed)
BIB [facility] for R knee pain. Hx of arthritis. Pt states her pain is worse than usual today. Pt states she didn't take any pain medicine at home. Pt is prescribed Tramadol but doesn't take it because "it makes her feel drugged out".

## 2023-02-10 ENCOUNTER — Ambulatory Visit: Payer: Self-pay | Admitting: *Deleted

## 2023-02-10 NOTE — Telephone Encounter (Signed)
Interpreter Maricielo ID # [id number] Chief Complaint: ear pain "ball" behind right ear Symptoms: pain severe behind right ear. Reports she tried to pierce ear and not sure if that has anything to do with it.  Frequency: greater than 6 months  Pertinent Negatives: Patient denies drainage. No fever Disposition: [] ED /[x] Urgent Care (no appt availability in office) / [] Appointment(In office/virtual)/ []  [facility]/ [] Home Care/ [] Refused Recommended Disposition /[] [facility]/ []  Follow-up with PCP Additional Notes:  No PCP. Recommended UC or ED. Attempted to schedule new patient appt last seen 2016 at [facility]. No providers accepting new appt.         Reason for Disposition  [1] SEVERE pain AND [2] not improved 2 hours after taking analgesic medication (e.g., ibuprofen or acetaminophen)  Answer Assessment - Initial Assessment Questions 1. LOCATION: "Which ear is involved?"     Right ear 2. ONSET: "When did the ear start hurting"      6 months but now hurts worse after getting bigger 3. SEVERITY: "How bad is the pain?"  (Scale 1-10; mild, moderate or severe)   - MILD (1-3): doesn't interfere with normal activities    - MODERATE (4-7): interferes with normal activities or awakens from sleep    - SEVERE (8-10): excruciating pain, unable to do any normal activities      Pain level 8  4. URI SYMPTOMS: "Do you have a runny nose or cough?"     Na  5. FEVER: "Do you have a fever?" If Yes, ask: "What is your temperature, how was it measured, and when did it start?"     na 6. CAUSE: "Have you been swimming recently?", "How often do you use Q-TIPS?", "Have you had any recent air travel or scuba diving?"     na 7. OTHER SYMPTOMS: "Do you have any other symptoms?" (e.g., headache, stiff neck, dizziness, vomiting, runny nose, decreased hearing)     Pain outside of ear. "Ball"  behind ear, chills but no fever 8. PREGNANCY: "Is there any chance you are pregnant?" "When was your last  menstrual period?"     na  Protocols used: Davina Poke
